# Patient Record
Sex: Male | Born: 1950 | Race: Black or African American | Hispanic: No | Marital: Married | Smoking: Former smoker
Health system: Southern US, Community
[De-identification: ages and names within clinical notes are randomized; demographics above are authoritative.]

## PROBLEM LIST (undated history)

## (undated) DIAGNOSIS — D638 Anemia in other chronic diseases classified elsewhere: Secondary | ICD-10-CM

## (undated) DIAGNOSIS — N186 End stage renal disease: Secondary | ICD-10-CM

## (undated) DIAGNOSIS — K219 Gastro-esophageal reflux disease without esophagitis: Secondary | ICD-10-CM

## (undated) DIAGNOSIS — E119 Type 2 diabetes mellitus without complications: Secondary | ICD-10-CM

## (undated) DIAGNOSIS — D869 Sarcoidosis, unspecified: Secondary | ICD-10-CM

## (undated) DIAGNOSIS — Z87442 Personal history of urinary calculi: Secondary | ICD-10-CM

## (undated) DIAGNOSIS — E7800 Pure hypercholesterolemia, unspecified: Secondary | ICD-10-CM

## (undated) DIAGNOSIS — I10 Essential (primary) hypertension: Secondary | ICD-10-CM

## (undated) DIAGNOSIS — I739 Peripheral vascular disease, unspecified: Secondary | ICD-10-CM

## (undated) HISTORY — DX: Peripheral vascular disease, unspecified: I73.9

## (undated) HISTORY — PX: BELOW KNEE LEG AMPUTATION: SUR23

## (undated) HISTORY — PX: COLONOSCOPY: SHX174

## (undated) HISTORY — DX: Type 2 diabetes mellitus without complications: E11.9

## (undated) HISTORY — DX: End stage renal disease: N18.6

## (undated) HISTORY — PX: ESOPHAGEAL DILATION: SHX303

## (undated) HISTORY — DX: Sarcoidosis, unspecified: D86.9

## (undated) HISTORY — DX: Essential (primary) hypertension: I10

## (undated) HISTORY — DX: Anemia in other chronic diseases classified elsewhere: D63.8

## (undated) HISTORY — PX: EYE SURGERY: SHX253

---

## 2002-04-02 ENCOUNTER — Encounter: Payer: Self-pay | Admitting: Internal Medicine

## 2002-04-02 ENCOUNTER — Ambulatory Visit (HOSPITAL_COMMUNITY): Admission: RE | Admit: 2002-04-02 | Discharge: 2002-04-02 | Payer: Self-pay | Admitting: Internal Medicine

## 2002-06-17 ENCOUNTER — Encounter: Admission: RE | Admit: 2002-06-17 | Discharge: 2002-06-17 | Payer: Self-pay | Admitting: Oncology

## 2002-06-17 ENCOUNTER — Encounter (HOSPITAL_COMMUNITY): Admission: RE | Admit: 2002-06-17 | Discharge: 2002-07-17 | Payer: Self-pay | Admitting: Oncology

## 2002-06-19 ENCOUNTER — Encounter (HOSPITAL_COMMUNITY): Payer: Self-pay | Admitting: Oncology

## 2002-06-27 ENCOUNTER — Encounter (HOSPITAL_COMMUNITY): Payer: Self-pay | Admitting: Oncology

## 2002-07-02 ENCOUNTER — Encounter: Admission: RE | Admit: 2002-07-02 | Discharge: 2002-07-02 | Payer: Self-pay | Admitting: Oncology

## 2002-07-02 ENCOUNTER — Encounter (HOSPITAL_COMMUNITY): Admission: RE | Admit: 2002-07-02 | Discharge: 2002-08-01 | Payer: Self-pay | Admitting: Oncology

## 2002-07-21 ENCOUNTER — Encounter (HOSPITAL_COMMUNITY): Payer: Self-pay | Admitting: Oncology

## 2002-09-05 ENCOUNTER — Ambulatory Visit (HOSPITAL_COMMUNITY): Admission: RE | Admit: 2002-09-05 | Discharge: 2002-09-05 | Payer: Self-pay | Admitting: Internal Medicine

## 2002-09-09 ENCOUNTER — Encounter: Admission: RE | Admit: 2002-09-09 | Discharge: 2002-09-09 | Payer: Self-pay | Admitting: Oncology

## 2003-03-11 ENCOUNTER — Encounter: Admission: RE | Admit: 2003-03-11 | Discharge: 2003-03-11 | Payer: Self-pay | Admitting: Oncology

## 2003-04-23 ENCOUNTER — Encounter (HOSPITAL_COMMUNITY): Admission: RE | Admit: 2003-04-23 | Discharge: 2003-05-23 | Payer: Self-pay | Admitting: Oncology

## 2003-05-29 ENCOUNTER — Encounter (HOSPITAL_COMMUNITY): Admission: RE | Admit: 2003-05-29 | Discharge: 2003-06-28 | Payer: Self-pay | Admitting: Nephrology

## 2003-07-03 ENCOUNTER — Encounter (HOSPITAL_COMMUNITY): Admission: RE | Admit: 2003-07-03 | Discharge: 2003-08-02 | Payer: Self-pay | Admitting: Nephrology

## 2003-08-07 ENCOUNTER — Encounter (HOSPITAL_COMMUNITY): Admission: RE | Admit: 2003-08-07 | Discharge: 2003-09-06 | Payer: Self-pay | Admitting: Nephrology

## 2003-09-11 ENCOUNTER — Encounter (HOSPITAL_COMMUNITY): Admission: RE | Admit: 2003-09-11 | Discharge: 2003-10-11 | Payer: Self-pay | Admitting: Nephrology

## 2003-10-16 ENCOUNTER — Encounter (HOSPITAL_COMMUNITY): Admission: RE | Admit: 2003-10-16 | Discharge: 2003-11-15 | Payer: Self-pay | Admitting: Nephrology

## 2003-11-20 ENCOUNTER — Encounter (HOSPITAL_COMMUNITY): Admission: RE | Admit: 2003-11-20 | Discharge: 2003-12-20 | Payer: Self-pay | Admitting: Nephrology

## 2004-01-01 ENCOUNTER — Encounter (HOSPITAL_COMMUNITY): Admission: RE | Admit: 2004-01-01 | Discharge: 2004-01-15 | Payer: Self-pay | Admitting: Oncology

## 2004-01-29 ENCOUNTER — Encounter (HOSPITAL_COMMUNITY): Admission: RE | Admit: 2004-01-29 | Discharge: 2004-02-28 | Payer: Self-pay | Admitting: Nephrology

## 2004-02-26 ENCOUNTER — Ambulatory Visit (HOSPITAL_COMMUNITY): Payer: Self-pay

## 2004-03-09 ENCOUNTER — Encounter (HOSPITAL_COMMUNITY): Admission: RE | Admit: 2004-03-09 | Discharge: 2004-04-08 | Payer: Self-pay | Admitting: Nephrology

## 2004-03-09 ENCOUNTER — Ambulatory Visit (HOSPITAL_COMMUNITY): Payer: Self-pay | Admitting: Nephrology

## 2004-04-15 ENCOUNTER — Encounter (HOSPITAL_COMMUNITY): Admission: RE | Admit: 2004-04-15 | Discharge: 2004-05-15 | Payer: Self-pay | Admitting: Oncology

## 2004-04-15 ENCOUNTER — Ambulatory Visit (HOSPITAL_COMMUNITY): Payer: Self-pay | Admitting: Internal Medicine

## 2004-05-27 ENCOUNTER — Ambulatory Visit (HOSPITAL_COMMUNITY): Payer: Self-pay | Admitting: Nephrology

## 2004-05-27 ENCOUNTER — Encounter (HOSPITAL_COMMUNITY): Admission: RE | Admit: 2004-05-27 | Discharge: 2004-06-26 | Payer: Self-pay | Admitting: Nephrology

## 2004-07-01 ENCOUNTER — Encounter (HOSPITAL_COMMUNITY): Admission: RE | Admit: 2004-07-01 | Discharge: 2004-07-31 | Payer: Self-pay | Admitting: Nephrology

## 2004-07-28 ENCOUNTER — Ambulatory Visit (HOSPITAL_COMMUNITY): Payer: Self-pay | Admitting: Internal Medicine

## 2004-08-12 ENCOUNTER — Encounter (HOSPITAL_COMMUNITY): Admission: RE | Admit: 2004-08-12 | Discharge: 2004-09-11 | Payer: Self-pay | Admitting: Nephrology

## 2004-08-12 ENCOUNTER — Ambulatory Visit (HOSPITAL_COMMUNITY): Payer: Self-pay | Admitting: Nephrology

## 2004-09-23 ENCOUNTER — Encounter (HOSPITAL_COMMUNITY): Admission: RE | Admit: 2004-09-23 | Discharge: 2004-10-23 | Payer: Self-pay | Admitting: Nephrology

## 2004-10-14 ENCOUNTER — Ambulatory Visit (HOSPITAL_COMMUNITY): Payer: Self-pay | Admitting: Nephrology

## 2004-10-28 ENCOUNTER — Encounter (HOSPITAL_COMMUNITY): Admission: RE | Admit: 2004-10-28 | Discharge: 2004-11-27 | Payer: Self-pay | Admitting: Nephrology

## 2004-12-07 ENCOUNTER — Encounter (HOSPITAL_COMMUNITY): Admission: RE | Admit: 2004-12-07 | Discharge: 2005-01-06 | Payer: Self-pay | Admitting: Nephrology

## 2004-12-07 ENCOUNTER — Ambulatory Visit (HOSPITAL_COMMUNITY): Payer: Self-pay | Admitting: Nephrology

## 2005-01-20 ENCOUNTER — Encounter (HOSPITAL_COMMUNITY): Admission: RE | Admit: 2005-01-20 | Discharge: 2005-02-19 | Payer: Self-pay | Admitting: Oncology

## 2005-02-17 ENCOUNTER — Ambulatory Visit (HOSPITAL_COMMUNITY): Payer: Self-pay | Admitting: Nephrology

## 2005-03-03 ENCOUNTER — Encounter (HOSPITAL_COMMUNITY): Admission: RE | Admit: 2005-03-03 | Discharge: 2005-04-02 | Payer: Self-pay | Admitting: Nephrology

## 2005-04-14 ENCOUNTER — Encounter (HOSPITAL_COMMUNITY): Admission: RE | Admit: 2005-04-14 | Discharge: 2005-05-14 | Payer: Self-pay | Admitting: Nephrology

## 2005-04-14 ENCOUNTER — Ambulatory Visit (HOSPITAL_COMMUNITY): Payer: Self-pay | Admitting: Nephrology

## 2005-04-28 ENCOUNTER — Encounter (HOSPITAL_COMMUNITY): Admission: RE | Admit: 2005-04-28 | Discharge: 2005-05-28 | Payer: Self-pay | Admitting: Oncology

## 2005-05-26 ENCOUNTER — Ambulatory Visit (HOSPITAL_COMMUNITY): Admission: RE | Admit: 2005-05-26 | Discharge: 2005-05-26 | Payer: Self-pay | Admitting: Podiatry

## 2005-06-09 ENCOUNTER — Ambulatory Visit (HOSPITAL_COMMUNITY): Payer: Self-pay | Admitting: Nephrology

## 2005-06-09 ENCOUNTER — Encounter (HOSPITAL_COMMUNITY): Admission: RE | Admit: 2005-06-09 | Discharge: 2005-07-09 | Payer: Self-pay | Admitting: Nephrology

## 2005-07-20 ENCOUNTER — Encounter (HOSPITAL_COMMUNITY): Admission: RE | Admit: 2005-07-20 | Discharge: 2005-08-19 | Payer: Self-pay | Admitting: Nephrology

## 2005-08-18 ENCOUNTER — Ambulatory Visit (HOSPITAL_COMMUNITY): Payer: Self-pay | Admitting: Nephrology

## 2005-09-01 ENCOUNTER — Encounter (HOSPITAL_COMMUNITY): Admission: RE | Admit: 2005-09-01 | Discharge: 2005-10-01 | Payer: Self-pay | Admitting: Nephrology

## 2005-10-13 ENCOUNTER — Ambulatory Visit (HOSPITAL_COMMUNITY): Payer: Self-pay | Admitting: Nephrology

## 2005-10-13 ENCOUNTER — Encounter (HOSPITAL_COMMUNITY): Admission: RE | Admit: 2005-10-13 | Discharge: 2005-11-12 | Payer: Self-pay | Admitting: Nephrology

## 2005-10-30 ENCOUNTER — Ambulatory Visit (HOSPITAL_COMMUNITY): Admission: RE | Admit: 2005-10-30 | Discharge: 2005-10-30 | Payer: Self-pay | Admitting: Vascular Surgery

## 2005-11-24 ENCOUNTER — Encounter (HOSPITAL_COMMUNITY): Admission: RE | Admit: 2005-11-24 | Discharge: 2005-12-24 | Payer: Self-pay | Admitting: Nephrology

## 2005-11-24 ENCOUNTER — Ambulatory Visit (HOSPITAL_COMMUNITY): Payer: Self-pay | Admitting: Nephrology

## 2005-12-22 ENCOUNTER — Ambulatory Visit (HOSPITAL_COMMUNITY): Payer: Self-pay | Admitting: Nephrology

## 2006-01-05 ENCOUNTER — Encounter (HOSPITAL_COMMUNITY): Admission: RE | Admit: 2006-01-05 | Discharge: 2006-01-12 | Payer: Self-pay | Admitting: Nephrology

## 2006-01-10 ENCOUNTER — Observation Stay (HOSPITAL_COMMUNITY): Admission: RE | Admit: 2006-01-10 | Discharge: 2006-01-11 | Payer: Self-pay | Admitting: General Surgery

## 2006-01-10 ENCOUNTER — Encounter (INDEPENDENT_AMBULATORY_CARE_PROVIDER_SITE_OTHER): Payer: Self-pay | Admitting: Specialist

## 2006-01-18 ENCOUNTER — Ambulatory Visit (HOSPITAL_COMMUNITY): Payer: Self-pay | Admitting: Oncology

## 2006-01-18 ENCOUNTER — Encounter: Admission: RE | Admit: 2006-01-18 | Discharge: 2006-01-18 | Payer: Self-pay | Admitting: Oncology

## 2006-01-18 ENCOUNTER — Encounter (HOSPITAL_COMMUNITY): Admission: RE | Admit: 2006-01-18 | Discharge: 2006-02-17 | Payer: Self-pay | Admitting: Oncology

## 2006-03-02 ENCOUNTER — Encounter (HOSPITAL_COMMUNITY): Admission: RE | Admit: 2006-03-02 | Discharge: 2006-04-01 | Payer: Self-pay | Admitting: Nephrology

## 2006-03-02 ENCOUNTER — Ambulatory Visit (HOSPITAL_COMMUNITY): Payer: Self-pay | Admitting: Nephrology

## 2006-04-13 ENCOUNTER — Encounter (HOSPITAL_COMMUNITY): Admission: RE | Admit: 2006-04-13 | Discharge: 2006-04-16 | Payer: Self-pay | Admitting: Oncology

## 2006-04-27 ENCOUNTER — Encounter (HOSPITAL_COMMUNITY): Admission: RE | Admit: 2006-04-27 | Discharge: 2006-05-27 | Payer: Self-pay | Admitting: Nephrology

## 2006-05-25 ENCOUNTER — Ambulatory Visit (HOSPITAL_COMMUNITY): Payer: Self-pay | Admitting: Nephrology

## 2006-06-22 ENCOUNTER — Encounter (HOSPITAL_COMMUNITY): Admission: RE | Admit: 2006-06-22 | Discharge: 2006-07-22 | Payer: Self-pay | Admitting: Nephrology

## 2006-08-17 ENCOUNTER — Encounter (HOSPITAL_COMMUNITY): Admission: RE | Admit: 2006-08-17 | Discharge: 2006-09-16 | Payer: Self-pay | Admitting: Nephrology

## 2006-08-17 ENCOUNTER — Ambulatory Visit (HOSPITAL_COMMUNITY): Payer: Self-pay | Admitting: Nephrology

## 2006-09-24 ENCOUNTER — Ambulatory Visit (HOSPITAL_COMMUNITY): Admission: RE | Admit: 2006-09-24 | Discharge: 2006-09-24 | Payer: Self-pay | Admitting: Internal Medicine

## 2006-09-24 ENCOUNTER — Ambulatory Visit: Payer: Self-pay | Admitting: Internal Medicine

## 2006-10-12 ENCOUNTER — Encounter (HOSPITAL_COMMUNITY): Admission: RE | Admit: 2006-10-12 | Discharge: 2006-11-11 | Payer: Self-pay | Admitting: Nephrology

## 2006-11-09 ENCOUNTER — Ambulatory Visit (HOSPITAL_COMMUNITY): Payer: Self-pay | Admitting: Nephrology

## 2006-12-14 ENCOUNTER — Encounter (HOSPITAL_COMMUNITY): Admission: RE | Admit: 2006-12-14 | Discharge: 2007-01-13 | Payer: Self-pay | Admitting: Nephrology

## 2007-01-11 ENCOUNTER — Ambulatory Visit (HOSPITAL_COMMUNITY): Payer: Self-pay | Admitting: Nephrology

## 2007-02-15 ENCOUNTER — Encounter (HOSPITAL_COMMUNITY): Admission: RE | Admit: 2007-02-15 | Discharge: 2007-03-17 | Payer: Self-pay | Admitting: Oncology

## 2007-03-13 ENCOUNTER — Ambulatory Visit (HOSPITAL_COMMUNITY): Payer: Self-pay | Admitting: Nephrology

## 2007-04-22 ENCOUNTER — Encounter (HOSPITAL_COMMUNITY): Admission: RE | Admit: 2007-04-22 | Discharge: 2007-05-22 | Payer: Self-pay | Admitting: Nephrology

## 2007-05-24 ENCOUNTER — Ambulatory Visit (HOSPITAL_COMMUNITY): Payer: Self-pay | Admitting: Nephrology

## 2007-05-24 ENCOUNTER — Encounter (HOSPITAL_COMMUNITY): Admission: RE | Admit: 2007-05-24 | Discharge: 2007-06-23 | Payer: Self-pay | Admitting: Oncology

## 2007-06-23 ENCOUNTER — Inpatient Hospital Stay (HOSPITAL_COMMUNITY): Admission: EM | Admit: 2007-06-23 | Discharge: 2007-06-29 | Payer: Self-pay | Admitting: Emergency Medicine

## 2007-06-26 ENCOUNTER — Encounter (INDEPENDENT_AMBULATORY_CARE_PROVIDER_SITE_OTHER): Payer: Self-pay | Admitting: General Surgery

## 2007-07-19 ENCOUNTER — Encounter (HOSPITAL_COMMUNITY): Admission: RE | Admit: 2007-07-19 | Discharge: 2007-08-18 | Payer: Self-pay | Admitting: Nephrology

## 2007-08-16 ENCOUNTER — Ambulatory Visit (HOSPITAL_COMMUNITY): Payer: Self-pay | Admitting: Nephrology

## 2007-09-20 ENCOUNTER — Encounter (HOSPITAL_COMMUNITY): Admission: RE | Admit: 2007-09-20 | Discharge: 2007-10-20 | Payer: Self-pay | Admitting: Nephrology

## 2007-10-18 ENCOUNTER — Ambulatory Visit (HOSPITAL_COMMUNITY): Payer: Self-pay | Admitting: Nephrology

## 2007-11-22 ENCOUNTER — Encounter (HOSPITAL_COMMUNITY): Admission: RE | Admit: 2007-11-22 | Discharge: 2007-12-22 | Payer: Self-pay | Admitting: Nephrology

## 2007-12-27 ENCOUNTER — Ambulatory Visit (HOSPITAL_COMMUNITY): Payer: Self-pay | Admitting: Nephrology

## 2007-12-27 ENCOUNTER — Encounter (HOSPITAL_COMMUNITY): Admission: RE | Admit: 2007-12-27 | Discharge: 2008-01-26 | Payer: Self-pay | Admitting: Oncology

## 2008-02-18 ENCOUNTER — Encounter (HOSPITAL_COMMUNITY): Admission: RE | Admit: 2008-02-18 | Discharge: 2008-03-19 | Payer: Self-pay | Admitting: Nephrology

## 2008-02-18 ENCOUNTER — Ambulatory Visit (HOSPITAL_COMMUNITY): Payer: Self-pay | Admitting: Nephrology

## 2008-03-20 ENCOUNTER — Encounter (HOSPITAL_COMMUNITY): Admission: RE | Admit: 2008-03-20 | Discharge: 2008-04-19 | Payer: Self-pay | Admitting: Nephrology

## 2008-04-17 HISTORY — PX: AMPUTATION OF REPLICATED TOES: SHX1136

## 2008-05-01 ENCOUNTER — Encounter (HOSPITAL_COMMUNITY): Admission: RE | Admit: 2008-05-01 | Discharge: 2008-05-31 | Payer: Self-pay | Admitting: Nephrology

## 2008-05-01 ENCOUNTER — Ambulatory Visit (HOSPITAL_COMMUNITY): Payer: Self-pay | Admitting: Nephrology

## 2008-06-05 ENCOUNTER — Encounter (HOSPITAL_COMMUNITY): Admission: RE | Admit: 2008-06-05 | Discharge: 2008-07-05 | Payer: Self-pay | Admitting: Nephrology

## 2008-07-03 ENCOUNTER — Ambulatory Visit (HOSPITAL_COMMUNITY): Payer: Self-pay | Admitting: Nephrology

## 2008-07-31 ENCOUNTER — Encounter (HOSPITAL_COMMUNITY): Admission: RE | Admit: 2008-07-31 | Discharge: 2008-08-30 | Payer: Self-pay | Admitting: Nephrology

## 2008-08-28 ENCOUNTER — Ambulatory Visit (HOSPITAL_COMMUNITY): Payer: Self-pay | Admitting: Nephrology

## 2008-09-25 ENCOUNTER — Encounter (HOSPITAL_COMMUNITY): Admission: RE | Admit: 2008-09-25 | Discharge: 2008-10-25 | Payer: Self-pay | Admitting: Nephrology

## 2008-10-30 ENCOUNTER — Encounter (HOSPITAL_COMMUNITY): Admission: RE | Admit: 2008-10-30 | Discharge: 2008-11-29 | Payer: Self-pay | Admitting: Nephrology

## 2008-10-30 ENCOUNTER — Ambulatory Visit (HOSPITAL_COMMUNITY): Payer: Self-pay | Admitting: Nephrology

## 2009-01-01 ENCOUNTER — Encounter (HOSPITAL_COMMUNITY): Admission: RE | Admit: 2009-01-01 | Discharge: 2009-01-13 | Payer: Self-pay | Admitting: Nephrology

## 2009-01-01 ENCOUNTER — Ambulatory Visit (HOSPITAL_COMMUNITY): Payer: Self-pay | Admitting: Nephrology

## 2009-01-29 ENCOUNTER — Encounter (HOSPITAL_COMMUNITY): Admission: RE | Admit: 2009-01-29 | Discharge: 2009-02-28 | Payer: Self-pay | Admitting: Nephrology

## 2009-02-26 ENCOUNTER — Ambulatory Visit (HOSPITAL_COMMUNITY): Payer: Self-pay | Admitting: Nephrology

## 2009-03-26 ENCOUNTER — Encounter (HOSPITAL_COMMUNITY): Admission: RE | Admit: 2009-03-26 | Discharge: 2009-04-14 | Payer: Self-pay | Admitting: Nephrology

## 2009-05-07 ENCOUNTER — Ambulatory Visit (HOSPITAL_COMMUNITY): Payer: Self-pay | Admitting: Nephrology

## 2009-05-07 ENCOUNTER — Encounter (HOSPITAL_COMMUNITY): Admission: RE | Admit: 2009-05-07 | Discharge: 2009-06-06 | Payer: Self-pay | Admitting: Nephrology

## 2009-06-18 ENCOUNTER — Encounter (HOSPITAL_COMMUNITY): Admission: RE | Admit: 2009-06-18 | Discharge: 2009-07-18 | Payer: Self-pay | Admitting: Nephrology

## 2009-08-05 ENCOUNTER — Encounter (HOSPITAL_COMMUNITY): Admission: RE | Admit: 2009-08-05 | Discharge: 2009-09-04 | Payer: Self-pay | Admitting: Nephrology

## 2009-08-05 ENCOUNTER — Ambulatory Visit (HOSPITAL_COMMUNITY): Payer: Self-pay | Admitting: Nephrology

## 2009-10-08 ENCOUNTER — Ambulatory Visit (HOSPITAL_COMMUNITY): Payer: Self-pay | Admitting: Nephrology

## 2009-10-08 ENCOUNTER — Encounter (HOSPITAL_COMMUNITY): Admission: RE | Admit: 2009-10-08 | Discharge: 2009-11-07 | Payer: Self-pay | Admitting: Nephrology

## 2009-12-10 ENCOUNTER — Ambulatory Visit (HOSPITAL_COMMUNITY): Payer: Self-pay | Admitting: Nephrology

## 2009-12-10 ENCOUNTER — Encounter (HOSPITAL_COMMUNITY): Admission: RE | Admit: 2009-12-10 | Discharge: 2010-01-09 | Payer: Self-pay | Admitting: Nephrology

## 2010-01-14 ENCOUNTER — Encounter (HOSPITAL_COMMUNITY): Admission: RE | Admit: 2010-01-14 | Discharge: 2010-01-14 | Payer: Self-pay | Admitting: Nephrology

## 2010-02-18 ENCOUNTER — Encounter (HOSPITAL_COMMUNITY)
Admission: RE | Admit: 2010-02-18 | Discharge: 2010-03-20 | Payer: Self-pay | Source: Home / Self Care | Admitting: Nephrology

## 2010-02-18 ENCOUNTER — Ambulatory Visit (HOSPITAL_COMMUNITY): Payer: Self-pay | Admitting: Nephrology

## 2010-03-25 ENCOUNTER — Encounter (HOSPITAL_COMMUNITY)
Admission: RE | Admit: 2010-03-25 | Discharge: 2010-04-24 | Payer: Self-pay | Source: Home / Self Care | Attending: Nephrology | Admitting: Nephrology

## 2010-04-29 ENCOUNTER — Encounter (HOSPITAL_COMMUNITY)
Admission: RE | Admit: 2010-04-29 | Discharge: 2010-05-17 | Payer: Self-pay | Source: Home / Self Care | Attending: Nephrology | Admitting: Nephrology

## 2010-04-29 ENCOUNTER — Ambulatory Visit (HOSPITAL_COMMUNITY)
Admission: RE | Admit: 2010-04-29 | Discharge: 2010-05-17 | Payer: Self-pay | Source: Home / Self Care | Attending: Nephrology | Admitting: Nephrology

## 2010-05-02 LAB — RENAL FUNCTION PANEL
Albumin: 3.5 g/dL (ref 3.5–5.2)
BUN: 28 mg/dL — ABNORMAL HIGH (ref 6–23)
CO2: 23 mEq/L (ref 19–32)
Calcium: 8.8 mg/dL (ref 8.4–10.5)
Chloride: 110 mEq/L (ref 96–112)
Creatinine, Ser: 2.81 mg/dL — ABNORMAL HIGH (ref 0.4–1.5)
GFR calc Af Amer: 28 mL/min — ABNORMAL LOW (ref >60)
GFR calc non Af Amer: 23 mL/min — ABNORMAL LOW (ref >60)
Glucose, Bld: 95 mg/dL (ref 70–99)
Phosphorus: 4.4 mg/dL (ref 2.3–4.6)
Potassium: 4.5 mEq/L (ref 3.5–5.1)
Sodium: 142 mEq/L (ref 135–145)

## 2010-05-02 LAB — HEMOGLOBIN AND HEMATOCRIT, BLOOD
HCT: 31.4 % — ABNORMAL LOW (ref 39.0–52.0)
Hemoglobin: 10.8 g/dL — ABNORMAL LOW (ref 13.0–17.0)

## 2010-06-03 ENCOUNTER — Other Ambulatory Visit: Payer: Self-pay | Admitting: Nephrology

## 2010-06-03 ENCOUNTER — Encounter (HOSPITAL_COMMUNITY): Payer: Medicare Other | Attending: Oncology

## 2010-06-03 ENCOUNTER — Ambulatory Visit (HOSPITAL_COMMUNITY): Payer: Medicare Other

## 2010-06-03 DIAGNOSIS — I129 Hypertensive chronic kidney disease with stage 1 through stage 4 chronic kidney disease, or unspecified chronic kidney disease: Secondary | ICD-10-CM | POA: Insufficient documentation

## 2010-06-03 DIAGNOSIS — D631 Anemia in chronic kidney disease: Secondary | ICD-10-CM | POA: Insufficient documentation

## 2010-06-03 DIAGNOSIS — N189 Chronic kidney disease, unspecified: Secondary | ICD-10-CM | POA: Insufficient documentation

## 2010-06-03 LAB — RENAL FUNCTION PANEL
Albumin: 3.5 g/dL (ref 3.5–5.2)
BUN: 39 mg/dL — ABNORMAL HIGH (ref 6–23)
CO2: 23 mEq/L (ref 19–32)
Calcium: 8.8 mg/dL (ref 8.4–10.5)
Chloride: 111 mEq/L (ref 96–112)
Creatinine, Ser: 3.04 mg/dL — ABNORMAL HIGH (ref 0.4–1.5)
GFR calc Af Amer: 26 mL/min — ABNORMAL LOW (ref >60)
GFR calc non Af Amer: 21 mL/min — ABNORMAL LOW (ref >60)
Glucose, Bld: 140 mg/dL — ABNORMAL HIGH (ref 70–99)
Phosphorus: 3.9 mg/dL (ref 2.3–4.6)
Potassium: 4.9 mEq/L (ref 3.5–5.1)
Sodium: 141 mEq/L (ref 135–145)

## 2010-06-03 LAB — HEMOGLOBIN AND HEMATOCRIT, BLOOD
HCT: 34.1 % — ABNORMAL LOW (ref 39.0–52.0)
Hemoglobin: 11.4 g/dL — ABNORMAL LOW (ref 13.0–17.0)

## 2010-06-27 LAB — RENAL FUNCTION PANEL
Albumin: 3.5 g/dL (ref 3.5–5.2)
BUN: 33 mg/dL — ABNORMAL HIGH (ref 6–23)
CO2: 24 mEq/L (ref 19–32)
Calcium: 9 mg/dL (ref 8.4–10.5)
Chloride: 111 mEq/L (ref 96–112)
Creatinine, Ser: 2.71 mg/dL — ABNORMAL HIGH (ref 0.4–1.5)
GFR calc Af Amer: 29 mL/min — ABNORMAL LOW (ref >60)
GFR calc non Af Amer: 24 mL/min — ABNORMAL LOW (ref >60)
Glucose, Bld: 118 mg/dL — ABNORMAL HIGH (ref 70–99)
Phosphorus: 4 mg/dL (ref 2.3–4.6)
Potassium: 4.5 mEq/L (ref 3.5–5.1)
Sodium: 142 mEq/L (ref 135–145)

## 2010-06-27 LAB — HEMOGLOBIN AND HEMATOCRIT, BLOOD
HCT: 30.2 % — ABNORMAL LOW (ref 39.0–52.0)
Hemoglobin: 10.2 g/dL — ABNORMAL LOW (ref 13.0–17.0)

## 2010-06-28 LAB — RENAL FUNCTION PANEL
Albumin: 3.4 g/dL — ABNORMAL LOW (ref 3.5–5.2)
BUN: 34 mg/dL — ABNORMAL HIGH (ref 6–23)
CO2: 20 mEq/L (ref 19–32)
Calcium: 8.9 mg/dL (ref 8.4–10.5)
Chloride: 110 mEq/L (ref 96–112)
Creatinine, Ser: 3.08 mg/dL — ABNORMAL HIGH (ref 0.4–1.5)
GFR calc Af Amer: 25 mL/min — ABNORMAL LOW (ref >60)
GFR calc non Af Amer: 21 mL/min — ABNORMAL LOW (ref >60)
Glucose, Bld: 102 mg/dL — ABNORMAL HIGH (ref 70–99)
Phosphorus: 3.4 mg/dL (ref 2.3–4.6)
Potassium: 4.9 mEq/L (ref 3.5–5.1)
Sodium: 137 mEq/L (ref 135–145)

## 2010-06-28 LAB — HEMOGLOBIN AND HEMATOCRIT, BLOOD
HCT: 31.2 % — ABNORMAL LOW (ref 39.0–52.0)
Hemoglobin: 10.6 g/dL — ABNORMAL LOW (ref 13.0–17.0)

## 2010-06-30 LAB — RENAL FUNCTION PANEL
Albumin: 3.4 g/dL — ABNORMAL LOW (ref 3.5–5.2)
Albumin: 3.3 g/dL — ABNORMAL LOW (ref 3.5–5.2)
BUN: 30 mg/dL — ABNORMAL HIGH (ref 6–23)
BUN: 38 mg/dL — ABNORMAL HIGH (ref 6–23)
CO2: 25 mEq/L (ref 19–32)
CO2: 21 mEq/L (ref 19–32)
Calcium: 8.8 mg/dL (ref 8.4–10.5)
Calcium: 8.9 mg/dL (ref 8.4–10.5)
Chloride: 109 mEq/L (ref 96–112)
Chloride: 110 mEq/L (ref 96–112)
Creatinine, Ser: 2.82 mg/dL — ABNORMAL HIGH (ref 0.4–1.5)
Creatinine, Ser: 3.23 mg/dL — ABNORMAL HIGH (ref 0.4–1.5)
GFR calc Af Amer: 28 mL/min — ABNORMAL LOW (ref >60)
GFR calc Af Amer: 24 mL/min — ABNORMAL LOW (ref >60)
GFR calc non Af Amer: 23 mL/min — ABNORMAL LOW (ref >60)
GFR calc non Af Amer: 20 mL/min — ABNORMAL LOW (ref >60)
Glucose, Bld: 128 mg/dL — ABNORMAL HIGH (ref 70–99)
Glucose, Bld: 112 mg/dL — ABNORMAL HIGH (ref 70–99)
Phosphorus: 3.4 mg/dL (ref 2.3–4.6)
Phosphorus: 3 mg/dL (ref 2.3–4.6)
Potassium: 4.1 mEq/L (ref 3.5–5.1)
Potassium: 4.6 mEq/L (ref 3.5–5.1)
Sodium: 142 mEq/L (ref 135–145)
Sodium: 137 mEq/L (ref 135–145)

## 2010-06-30 LAB — HEMOGLOBIN AND HEMATOCRIT, BLOOD
HCT: 30.8 % — ABNORMAL LOW (ref 39.0–52.0)
HCT: 31.1 % — ABNORMAL LOW (ref 39.0–52.0)
Hemoglobin: 10.5 g/dL — ABNORMAL LOW (ref 13.0–17.0)
Hemoglobin: 10.6 g/dL — ABNORMAL LOW (ref 13.0–17.0)

## 2010-07-01 ENCOUNTER — Encounter (HOSPITAL_COMMUNITY): Payer: Medicare Other | Attending: Nephrology

## 2010-07-01 ENCOUNTER — Ambulatory Visit (HOSPITAL_COMMUNITY): Payer: Medicare Other

## 2010-07-01 DIAGNOSIS — D631 Anemia in chronic kidney disease: Secondary | ICD-10-CM | POA: Insufficient documentation

## 2010-07-01 DIAGNOSIS — I129 Hypertensive chronic kidney disease with stage 1 through stage 4 chronic kidney disease, or unspecified chronic kidney disease: Secondary | ICD-10-CM | POA: Insufficient documentation

## 2010-07-01 DIAGNOSIS — N189 Chronic kidney disease, unspecified: Secondary | ICD-10-CM | POA: Insufficient documentation

## 2010-07-01 DIAGNOSIS — N039 Chronic nephritic syndrome with unspecified morphologic changes: Secondary | ICD-10-CM | POA: Insufficient documentation

## 2010-07-02 LAB — RENAL FUNCTION PANEL
Albumin: 3.2 g/dL — ABNORMAL LOW (ref 3.5–5.2)
BUN: 35 mg/dL — ABNORMAL HIGH (ref 6–23)
CO2: 21 mEq/L (ref 19–32)
Calcium: 8.9 mg/dL (ref 8.4–10.5)
Chloride: 110 mEq/L (ref 96–112)
Creatinine, Ser: 4.06 mg/dL — ABNORMAL HIGH (ref 0.4–1.5)
GFR calc Af Amer: 18 mL/min — ABNORMAL LOW (ref >60)
GFR calc non Af Amer: 15 mL/min — ABNORMAL LOW (ref >60)
Glucose, Bld: 83 mg/dL (ref 70–99)
Phosphorus: 3.8 mg/dL (ref 2.3–4.6)
Potassium: 5.4 mEq/L — ABNORMAL HIGH (ref 3.5–5.1)
Sodium: 137 mEq/L (ref 135–145)

## 2010-07-02 LAB — HEMOGLOBIN AND HEMATOCRIT, BLOOD
HCT: 30.4 % — ABNORMAL LOW (ref 39.0–52.0)
Hemoglobin: 10.3 g/dL — ABNORMAL LOW (ref 13.0–17.0)

## 2010-07-03 LAB — RENAL FUNCTION PANEL
Albumin: 3.5 g/dL (ref 3.5–5.2)
Albumin: 3.4 g/dL — ABNORMAL LOW (ref 3.5–5.2)
BUN: 37 mg/dL — ABNORMAL HIGH (ref 6–23)
BUN: 27 mg/dL — ABNORMAL HIGH (ref 6–23)
CO2: 21 mEq/L (ref 19–32)
CO2: 21 mEq/L (ref 19–32)
Calcium: 8.8 mg/dL (ref 8.4–10.5)
Calcium: 8.6 mg/dL (ref 8.4–10.5)
Chloride: 108 mEq/L (ref 96–112)
Chloride: 107 mEq/L (ref 96–112)
Creatinine, Ser: 2.9 mg/dL — ABNORMAL HIGH (ref 0.4–1.5)
Creatinine, Ser: 2.61 mg/dL — ABNORMAL HIGH (ref 0.4–1.5)
GFR calc Af Amer: 27 mL/min — ABNORMAL LOW (ref >60)
GFR calc Af Amer: 31 mL/min — ABNORMAL LOW (ref >60)
GFR calc non Af Amer: 23 mL/min — ABNORMAL LOW (ref >60)
GFR calc non Af Amer: 25 mL/min — ABNORMAL LOW (ref >60)
Glucose, Bld: 97 mg/dL (ref 70–99)
Glucose, Bld: 116 mg/dL — ABNORMAL HIGH (ref 70–99)
Phosphorus: 3.6 mg/dL (ref 2.3–4.6)
Phosphorus: 3.6 mg/dL (ref 2.3–4.6)
Potassium: 4.5 mEq/L (ref 3.5–5.1)
Potassium: 4.5 mEq/L (ref 3.5–5.1)
Sodium: 140 mEq/L (ref 135–145)
Sodium: 135 mEq/L (ref 135–145)

## 2010-07-03 LAB — HEMOGLOBIN AND HEMATOCRIT, BLOOD
HCT: 35.2 % — ABNORMAL LOW (ref 39.0–52.0)
HCT: 32.7 % — ABNORMAL LOW (ref 39.0–52.0)
Hemoglobin: 12 g/dL — ABNORMAL LOW (ref 13.0–17.0)
Hemoglobin: 11.2 g/dL — ABNORMAL LOW (ref 13.0–17.0)

## 2010-07-04 LAB — RENAL FUNCTION PANEL
Albumin: 3.7 g/dL (ref 3.5–5.2)
BUN: 42 mg/dL — ABNORMAL HIGH (ref 6–23)
Calcium: 8.8 mg/dL (ref 8.4–10.5)
Creatinine, Ser: 2.96 mg/dL — ABNORMAL HIGH (ref 0.4–1.5)
Phosphorus: 3.4 mg/dL (ref 2.3–4.6)
Potassium: 5 mEq/L (ref 3.5–5.1)

## 2010-07-04 LAB — HEMOGLOBIN AND HEMATOCRIT, BLOOD
HCT: 33.8 % — ABNORMAL LOW (ref 39.0–52.0)
Hemoglobin: 12.1 g/dL — ABNORMAL LOW (ref 13.0–17.0)

## 2010-07-05 LAB — HEMOGLOBIN AND HEMATOCRIT, BLOOD: HCT: 33 % — ABNORMAL LOW (ref 39.0–52.0)

## 2010-07-05 LAB — RENAL FUNCTION PANEL
BUN: 35 mg/dL — ABNORMAL HIGH (ref 6–23)
CO2: 21 mEq/L (ref 19–32)
Chloride: 111 mEq/L (ref 96–112)
Creatinine, Ser: 2.8 mg/dL — ABNORMAL HIGH (ref 0.4–1.5)
Glucose, Bld: 137 mg/dL — ABNORMAL HIGH (ref 70–99)

## 2010-07-10 LAB — RENAL FUNCTION PANEL
Albumin: 3.6 g/dL (ref 3.5–5.2)
CO2: 25 mEq/L (ref 19–32)
Chloride: 108 mEq/L (ref 96–112)
GFR calc Af Amer: 29 mL/min — ABNORMAL LOW (ref >60)
GFR calc non Af Amer: 24 mL/min — ABNORMAL LOW (ref >60)
Potassium: 4.8 mEq/L (ref 3.5–5.1)
Sodium: 138 mEq/L (ref 135–145)

## 2010-07-10 LAB — HEMOGLOBIN AND HEMATOCRIT, BLOOD: HCT: 34.7 % — ABNORMAL LOW (ref 39.0–52.0)

## 2010-07-19 LAB — RENAL FUNCTION PANEL
Calcium: 9 mg/dL (ref 8.4–10.5)
Creatinine, Ser: 2.97 mg/dL — ABNORMAL HIGH (ref 0.4–1.5)
GFR calc Af Amer: 27 mL/min — ABNORMAL LOW (ref >60)
GFR calc non Af Amer: 22 mL/min — ABNORMAL LOW (ref >60)
Phosphorus: 3.9 mg/dL (ref 2.3–4.6)
Sodium: 140 mEq/L (ref 135–145)

## 2010-07-19 LAB — HEMOGLOBIN AND HEMATOCRIT, BLOOD: Hemoglobin: 12.1 g/dL — ABNORMAL LOW (ref 13.0–17.0)

## 2010-07-20 LAB — RENAL FUNCTION PANEL
BUN: 40 mg/dL — ABNORMAL HIGH (ref 6–23)
CO2: 21 mEq/L (ref 19–32)
Glucose, Bld: 116 mg/dL — ABNORMAL HIGH (ref 70–99)
Phosphorus: 3.8 mg/dL (ref 2.3–4.6)
Potassium: 5.1 mEq/L (ref 3.5–5.1)

## 2010-07-20 LAB — HEMOGLOBIN AND HEMATOCRIT, BLOOD: Hemoglobin: 12.2 g/dL — ABNORMAL LOW (ref 13.0–17.0)

## 2010-07-21 LAB — HEMOGLOBIN AND HEMATOCRIT, BLOOD
HCT: 33.9 % — ABNORMAL LOW (ref 39.0–52.0)
Hemoglobin: 11.7 g/dL — ABNORMAL LOW (ref 13.0–17.0)

## 2010-07-21 LAB — RENAL FUNCTION PANEL
Albumin: 3.8 g/dL (ref 3.5–5.2)
BUN: 37 mg/dL — ABNORMAL HIGH (ref 6–23)
Chloride: 112 mEq/L (ref 96–112)
GFR calc non Af Amer: 24 mL/min — ABNORMAL LOW (ref >60)
Phosphorus: 3.8 mg/dL (ref 2.3–4.6)
Potassium: 4.6 mEq/L (ref 3.5–5.1)
Sodium: 140 mEq/L (ref 135–145)

## 2010-07-22 LAB — RENAL FUNCTION PANEL
Albumin: 3.4 g/dL — ABNORMAL LOW (ref 3.5–5.2)
BUN: 35 mg/dL — ABNORMAL HIGH (ref 6–23)
Calcium: 9.5 mg/dL (ref 8.4–10.5)
Chloride: 109 mEq/L (ref 96–112)
Creatinine, Ser: 2.68 mg/dL — ABNORMAL HIGH (ref 0.4–1.5)
GFR calc Af Amer: 30 mL/min — ABNORMAL LOW (ref >60)
GFR calc non Af Amer: 25 mL/min — ABNORMAL LOW (ref >60)
Phosphorus: 4.1 mg/dL (ref 2.3–4.6)

## 2010-07-22 LAB — HEMOGLOBIN AND HEMATOCRIT, BLOOD
HCT: 32.1 % — ABNORMAL LOW (ref 39.0–52.0)
Hemoglobin: 11.1 g/dL — ABNORMAL LOW (ref 13.0–17.0)

## 2010-07-23 LAB — RENAL FUNCTION PANEL
BUN: 23 mg/dL (ref 6–23)
CO2: 23 mEq/L (ref 19–32)
Calcium: 8.9 mg/dL (ref 8.4–10.5)
Chloride: 109 mEq/L (ref 96–112)
Creatinine, Ser: 2.69 mg/dL — ABNORMAL HIGH (ref 0.4–1.5)
GFR calc Af Amer: 30 mL/min — ABNORMAL LOW (ref >60)
GFR calc non Af Amer: 25 mL/min — ABNORMAL LOW (ref >60)
Glucose, Bld: 74 mg/dL (ref 70–99)

## 2010-07-24 LAB — RENAL FUNCTION PANEL
Albumin: 3.6 g/dL (ref 3.5–5.2)
BUN: 44 mg/dL — ABNORMAL HIGH (ref 6–23)
CO2: 20 mEq/L (ref 19–32)
Chloride: 111 mEq/L (ref 96–112)
Creatinine, Ser: 3.05 mg/dL — ABNORMAL HIGH (ref 0.4–1.5)
Potassium: 4.2 mEq/L (ref 3.5–5.1)

## 2010-07-24 LAB — HEMOGLOBIN AND HEMATOCRIT, BLOOD: HCT: 33.5 % — ABNORMAL LOW (ref 39.0–52.0)

## 2010-07-25 LAB — RENAL FUNCTION PANEL
Albumin: 3.4 g/dL — ABNORMAL LOW (ref 3.5–5.2)
BUN: 44 mg/dL — ABNORMAL HIGH (ref 6–23)
Calcium: 8.8 mg/dL (ref 8.4–10.5)
Glucose, Bld: 101 mg/dL — ABNORMAL HIGH (ref 70–99)
Phosphorus: 3.8 mg/dL (ref 2.3–4.6)
Sodium: 140 mEq/L (ref 135–145)

## 2010-07-25 LAB — HEMOGLOBIN AND HEMATOCRIT, BLOOD
HCT: 33.1 % — ABNORMAL LOW (ref 39.0–52.0)
Hemoglobin: 11.9 g/dL — ABNORMAL LOW (ref 13.0–17.0)

## 2010-07-26 LAB — RENAL FUNCTION PANEL
BUN: 33 mg/dL — ABNORMAL HIGH (ref 6–23)
Chloride: 111 mEq/L (ref 96–112)
Creatinine, Ser: 2.85 mg/dL — ABNORMAL HIGH (ref 0.4–1.5)
Glucose, Bld: 134 mg/dL — ABNORMAL HIGH (ref 70–99)
Potassium: 4.2 mEq/L (ref 3.5–5.1)

## 2010-07-27 LAB — RENAL FUNCTION PANEL
Albumin: 3.4 g/dL — ABNORMAL LOW (ref 3.5–5.2)
Chloride: 104 mEq/L (ref 96–112)
GFR calc Af Amer: 32 mL/min — ABNORMAL LOW (ref >60)
GFR calc non Af Amer: 26 mL/min — ABNORMAL LOW (ref >60)
Potassium: 4.2 mEq/L (ref 3.5–5.1)
Sodium: 137 mEq/L (ref 135–145)

## 2010-07-27 LAB — HEMOGLOBIN AND HEMATOCRIT, BLOOD: HCT: 34.8 % — ABNORMAL LOW (ref 39.0–52.0)

## 2010-07-28 LAB — RENAL FUNCTION PANEL
Albumin: 3.7 g/dL (ref 3.5–5.2)
CO2: 26 mEq/L (ref 19–32)
Calcium: 8.9 mg/dL (ref 8.4–10.5)
Creatinine, Ser: 2.63 mg/dL — ABNORMAL HIGH (ref 0.4–1.5)
GFR calc Af Amer: 31 mL/min — ABNORMAL LOW (ref >60)
GFR calc non Af Amer: 25 mL/min — ABNORMAL LOW (ref >60)
Phosphorus: 3.4 mg/dL (ref 2.3–4.6)

## 2010-07-28 LAB — HEMOGLOBIN AND HEMATOCRIT, BLOOD
HCT: 36.4 % — ABNORMAL LOW (ref 39.0–52.0)
Hemoglobin: 12.7 g/dL — ABNORMAL LOW (ref 13.0–17.0)

## 2010-07-29 ENCOUNTER — Encounter (HOSPITAL_COMMUNITY): Payer: Medicare Other | Attending: Internal Medicine

## 2010-07-29 ENCOUNTER — Encounter (HOSPITAL_COMMUNITY): Payer: Medicare Other

## 2010-07-29 DIAGNOSIS — N189 Chronic kidney disease, unspecified: Secondary | ICD-10-CM | POA: Insufficient documentation

## 2010-07-29 DIAGNOSIS — N039 Chronic nephritic syndrome with unspecified morphologic changes: Secondary | ICD-10-CM | POA: Insufficient documentation

## 2010-07-29 DIAGNOSIS — I129 Hypertensive chronic kidney disease with stage 1 through stage 4 chronic kidney disease, or unspecified chronic kidney disease: Secondary | ICD-10-CM | POA: Insufficient documentation

## 2010-07-29 DIAGNOSIS — D631 Anemia in chronic kidney disease: Secondary | ICD-10-CM | POA: Insufficient documentation

## 2010-08-01 LAB — RENAL FUNCTION PANEL
CO2: 25 mEq/L (ref 19–32)
Calcium: 9 mg/dL (ref 8.4–10.5)
Creatinine, Ser: 2.73 mg/dL — ABNORMAL HIGH (ref 0.4–1.5)
GFR calc Af Amer: 29 mL/min — ABNORMAL LOW (ref >60)
GFR calc non Af Amer: 24 mL/min — ABNORMAL LOW (ref >60)
Glucose, Bld: 150 mg/dL — ABNORMAL HIGH (ref 70–99)
Phosphorus: 3 mg/dL (ref 2.3–4.6)

## 2010-08-02 LAB — RENAL FUNCTION PANEL
Albumin: 3.4 g/dL — ABNORMAL LOW (ref 3.5–5.2)
BUN: 26 mg/dL — ABNORMAL HIGH (ref 6–23)
Calcium: 8.9 mg/dL (ref 8.4–10.5)
Chloride: 108 mEq/L (ref 96–112)
Creatinine, Ser: 2.23 mg/dL — ABNORMAL HIGH (ref 0.4–1.5)
GFR calc non Af Amer: 31 mL/min — ABNORMAL LOW (ref >60)
Phosphorus: 3.2 mg/dL (ref 2.3–4.6)

## 2010-08-26 ENCOUNTER — Other Ambulatory Visit: Payer: Self-pay | Admitting: Nephrology

## 2010-08-26 ENCOUNTER — Encounter (HOSPITAL_COMMUNITY): Payer: Medicare Other | Attending: Internal Medicine

## 2010-08-26 DIAGNOSIS — D631 Anemia in chronic kidney disease: Secondary | ICD-10-CM | POA: Insufficient documentation

## 2010-08-26 DIAGNOSIS — I129 Hypertensive chronic kidney disease with stage 1 through stage 4 chronic kidney disease, or unspecified chronic kidney disease: Secondary | ICD-10-CM | POA: Insufficient documentation

## 2010-08-26 DIAGNOSIS — N189 Chronic kidney disease, unspecified: Secondary | ICD-10-CM | POA: Insufficient documentation

## 2010-08-26 LAB — RENAL FUNCTION PANEL
BUN: 39 mg/dL — ABNORMAL HIGH (ref 6–23)
CO2: 23 mEq/L (ref 19–32)
Chloride: 107 mEq/L (ref 96–112)
Glucose, Bld: 159 mg/dL — ABNORMAL HIGH (ref 70–99)
Potassium: 4.2 mEq/L (ref 3.5–5.1)
Sodium: 139 mEq/L (ref 135–145)

## 2010-08-30 NOTE — Discharge Summary (Signed)
[name], [name] NO.:  [id number]   MEDICAL RECORD NO.:  [id number]          PATIENT TYPE:  INP   LOCATION:  A331                          FACILITY:  [facility]   PHYSICIAN:  Dalia Heading, M.D.  DATE OF BIRTH:  [birth year]   DATE OF ADMISSION:  06/23/2007  DATE OF DISCHARGE:  03/14/2009LH                               DISCHARGE SUMMARY   HOSPITAL COURSE SUMMARY:  The patient is a 60 year old black male with a  known history of peripheral vascular disease, hypertension, and renal  insufficiency who presented to [facility] with gangrene of the  left foot.  A surgery consultation was obtained.  The patient was noted  to have a right foot transmetatarsal amputation in the past by myself.  He subsequently was taken to the operating room on June 26, 2007, and  underwent a left foot transmetatarsal amputation of toes 2 through 5.  He tolerated the procedure well.  He did have some fevers  postoperatively.  He also had anemia of unknown etiology, though it was  suspected to be due to his renal insufficiency.  Final wound cultures  revealed pseudomonas which were sensitive to ciprofloxacin.   DISPOSITION:  The patient is being discharged home in good improving  condition on June 29, 2007.   DISCHARGE MEDICATIONS:  1. Baby aspirin 81 mg p.o. daily.  2. Lipitor 40 mg p.o. daily  3. Nifedipine XL 60 mg p.o. daily  4. Januvia 100 mg p.o. daily  5. Multivitamin 1 tablet p.o. daily  6. Cozaar 100 mg p.o. daily  7. Darvocet-N 100 one to two tablets p.o. q.6 h. p.r.n. pain  8. Ciprofloxacin 250 mg p.o. b.i.d. x2 weeks.   PRINCIPAL DIAGNOSES:  1. Gangrene, left foot.  2. Anemia.  3. Renal insufficiency.  4. Non-insulin-dependent diabetes mellitus.  5. Hypertension.   PRINCIPAL PROCEDURE:  Transmetatarsal amputation of left foot, toes 2  through 5 on June 26, 2007.      Dalia Heading, M.D.  Electronically Signed     MAJ/MEDQ  D:  06/29/2007  T:   06/29/2007  Job:  [id number]   cc:   Jorja Loa, M.D.  Fax: [phone]   Tesfaye D. Felecia Shelling, MD  Fax: [phone]

## 2010-08-30 NOTE — Group Therapy Note (Signed)
NAMEOLLIS, DAUDELIN                ACCOUNT NO.:  [id number]   MEDICAL RECORD NO.:  [id number]          PATIENT TYPE:  INP   LOCATION:  A331                          FACILITY:  [facility]   PHYSICIAN:  Edward L. Juanetta Gosling, M.D.DATE OF BIRTH:  01-21-1951   DATE OF PROCEDURE:  DATE OF DISCHARGE:                                 PROGRESS NOTE   Mr. Weisenburger said he feels well and he is hopeful of going home today.  He  said that there was some thought of him going home yesterday, but Dr.  Lovell Sheehan wanted him on a new medication for 24 hours before he left.  He  said he has his home medications already set up, which is good.  He has  no other new complaints.   PHYSICAL EXAMINATION:  Temperature 99.5, pulse 97, respirations 18,  blood pressure 144/78.  I did not undress his foot as it has just been  dressed, and Dr. Lovell Sheehan, I am sure, will see it before he goes.   ASSESSMENT:  1. He has renal failure, which seems to be improving.  2. Gangrenous left foot.  3. He has had am amputation and he does have Pseudomonas growing in      it.  He has had some fever.  4. He has diabetes mellitus type 2, which is pretty well controlled.   If he is discharged by Dr. Lovell Sheehan, I think that is okay.  He does say  that he has his medications,etc., set up at home.      Edward L. Juanetta Gosling, M.D.  Electronically Signed     ELH/MEDQ  D:  06/29/2007  T:  06/29/2007  Job:  [id number]

## 2010-08-30 NOTE — H&P (Signed)
Wayne [name], Wayne [name]                ACCOUNT NO.:  [id number]   MEDICAL RECORD NO.:  [id number]          PATIENT TYPE:  INP   LOCATION:  A331                          FACILITY:  [facility]   PHYSICIAN:  Tesfaye D. Felecia Shelling, MD   DATE OF BIRTH:  02/29/1951   DATE OF ADMISSION:  06/23/2007  DATE OF DISCHARGE:  LH                              HISTORY & PHYSICAL   CHIEF COMPLAINT:  Left foot wound.   HISTORY OF PRESENT ILLNESS:  This is a 60 year old male patient with a  history of multiple medical illnesses including diabetes mellitus,  hypertension and renal insufficiency who was admitted last night due to  infected left foot diabetic ulcer and fever.  The patient recently  noticed a small wound on his left foot which he was trying to treat at  home.  However, the wound continued to get worse and started draining  pusy material.  The patient spiked a fever of 103 degrees Fahrenheit  last night.  He then came to emergency room where he was evaluated.  The  patient was found to have necrotic and gangrenous wound on his left foot  around his toes.  The patient had a fever and he was started on IV  antibiotics and was admitted.   REVIEW OF SYSTEMS:  Patient has no headache, chest pain, nausea,  vomiting, abdominal pain, palpitation, dysuria, urgency or frequency of  urination.   PAST MEDICAL HISTORY:  1. Diabetes mellitus type 2.  2. Hypertension.  3. Chronic renal insufficiency.  4. Diabetic neuropathy.  5. History of right foot diabetic ulcer and status post amputation of      the third and fourth and fifth toes.  6. Anemia of chronic disease.   CURRENT MEDICATIONS:  1. Aspirin 81 mg daily.  2. Lipitor 40 mg daily.  3. Nifedipine 60 mg daily.  4. Januvia 100 mg daily.  5. Multivitamin one tablet daily.   SOCIAL HISTORY:  The patient is married.  Currently, he is disabled due  to his illness.  No history of alcohol, tobacco or substance abuse.   PHYSICAL EXAMINATION:  GENERAL:  The  patient is alert awake and  chronically sick looking.  VITAL SIGNS: Blood pressure 138/64, pulse 117, respiratory rate 22,  temperature 103.7 degrees Fahrenheit.  HEENT:  Pupils are equal and reactive.  NECK:  Supple.  CHEST: Clear.  Lung fields good air entry.  CARDIOVASCULAR:  First and second heart sound heard.  Tachycardiac.  ABDOMEN:  Soft and lax.  Bowel sounds positive.  No mass or  organomegaly.  EXTREMITIES:  The patient has gangrenous change of the left second and  third toe.  There is a wound on the plantar side around the toes with  pusy discharge and necrotic changes.  The patient also has amputation of  the right third, fourth and fifth toe.   LABS ON ADMISSION:  Sodium 131, potassium 4.3, chloride 98, carbon  dioxide 22, blood sugar 360, BUN 30, creatinine 3.47, calcium 8.6.  CBC:  WBC 10.7, hemoglobin 9.7, hematocrit 27.7, platelets 168.   ASSESSMENT:  1. Left  diabetic foot ulcer with gangrenous change of the left second      and third toes.  2. Fever, probably as a result of sepsis secondary to the infected      wound.  3. Diabetes mellitus poorly controlled.  4. Chronic renal failure.  5. Hypertension.  6. Anemia of chronic disease.   PLAN:  Will do continue the patient on combination of Unasyn and  vancomycin.  Will continue septic workup.  Will do surgical consult for  evaluation of the wound and possible amputation of the toes.  Will do  nephrology consult.  We will continue the patient on his regular  medications and also will start the patient on insulin to control his  diabetes.      Tesfaye D. Felecia Shelling, MD  Electronically Signed     TDF/MEDQ  D:  06/24/2007  T:  06/24/2007  Job:  [id number]

## 2010-08-30 NOTE — Op Note (Signed)
NAMELYALL, FACIANE NO.:  [id number]   MEDICAL RECORD NO.:  [id number]          PATIENT TYPE:  INP   LOCATION:  A331                          FACILITY:  [facility]   PHYSICIAN:  Dalia Heading, M.D.  DATE OF BIRTH:  13-Nov-1950   DATE OF PROCEDURE:  06/26/2007  DATE OF DISCHARGE:                               OPERATIVE REPORT   PREOPERATIVE DIAGNOSIS:  Gangrene, left foot.   POSTOPERATIVE DIAGNOSIS:  Gangrene, left foot.   OPERATION/PROCEDURE:  Transmetatarsal amputation of left foot digits 2  through 5.   SURGEON:  Dalia Heading, M.D.   ANESTHESIA:  Spinal.   INDICATIONS:  The patient is a 60 year old black male with renal  insufficiency, peripheral vascular disease, and diabetes mellitus who  presents with gangrene of left foot.  The toes 2 through 5 are involved  with gangrenous changes.  His great toe was still intact.  He is status  post right foot transmetatarsal amputation in the past.  He now presents  for transmetatarsal amputation of the left foot, sparing the great toe.  Risks and benefits of the procedure including bleeding and infection  were fully explained to the patient who gave informed consent.   PROCEDURE NOTE:  The patient was placed in the supine position after  spinal anesthesia was administered.  The left foot was prepped and  draped in the usual sterile technique with Betadine.  Surgical site  confirmation was performed.   An elliptical incision was made around toes 2 through 5.  The dissection  was taken down to the metatarsal joint.  A transmetatarsal amputation  was then performed in total using the power saw.  The toes were then  removed from the operative field and sent to pathology for further  examination.  Any bleeding was controlled using Bovie electrocautery.  The wound was irrigated with normal saline.  Xeroform was placed in the  base of the tissue.  There was some blood flow in this region.  Any  gangrenous tissue was  debrided.  The skin was reapproximated using 2-0  Prolene interrupted sutures.  Betadine ointment and dry sterile dressing  were applied.   All tape and needle counts correct at the end of the procedure.  The  patient was transferred to PACU in stable condition.   COMPLICATIONS:  None.   SPECIMEN:  Left toes, 2 through 5.   ESTIMATED BLOOD LOSS:  Minimal.      Dalia Heading, M.D.  Electronically Signed     MAJ/MEDQ  D:  06/26/2007  T:  06/27/2007  Job:  [id number]   cc:   Tesfaye D. Felecia Shelling, MD  Fax: [phone]   Jorja Loa, M.D.  Fax: [phone]

## 2010-08-30 NOTE — Consult Note (Signed)
[name], [name]                ACCOUNT NO.:  [id number]   MEDICAL RECORD NO.:  [id number]          PATIENT TYPE:  INP   LOCATION:  A331                          FACILITY:  [facility]   PHYSICIAN:  Jorja Loa, M.D.DATE OF BIRTH:  1950/12/09   DATE OF CONSULTATION:  06/24/2007  DATE OF DISCHARGE:                                 CONSULTATION   REASON FOR CONSULTATION:  Worsening renal failure.   Wayne [name] is a 60 year old gentleman with past medical history of  diabetes, history of hypertension, sarcoidosis, presently admitted  because of right necrotic, gangrenous left toe.  Wayne [name] has a  previous history of diabetic foot ulcer, status post surgery on the  right.  Presently he came with a history of pain, fever within the last  couple of days, and he was found third, fourth and fifth right toe  ulcer.  At this moment he denies any nausea or vomiting.  However, since  his creatinine has increased from a baseline of 2.2 to 3.4, a consult is  called.  He has diabetic nephropathy.  Baseline creatinine about 2 to  2.9 for the last 4 years.  At this moment he denies any shortness of  breath, dizziness or lightheadedness.   PAST MEDICAL HISTORY:  1. Longstanding history of diabetes.  2. History of hypertension.  3. He has chronic renal failure with baseline creatinine around 2 to      2.9.  His last blood work in February, creatinine was 2.9 with GFR      of 36 cc/min., hence, stage III.  4. He has history of anemia.  5. History of sarcoidosis.  6. History of splenomegaly.  7. History of hypercalcemia.  8. History of diabetic foot ulcer.  9. History of diabetic neuropathy.  10.He has also polyclonal gammopathy, i.e., proteinuria.   SURGICAL HISTORY:  Has history of cataract surgery.   His medications at this moment consist of:  1. Unasyn 1.5 g IV q.6 h.  2. Aspirin 81 mg p.o. daily.  3. Lipitor 40 mg p.o. daily.  4. Novolin insulin.  5. Lantus insulin.  6. Procardia 60  mg p.o. daily.  7. Januvia 50 mg p.o. daily.  8. Vancomycin 1000 mg IV q.24 h.  9. He is getting some pain medication.   ALLERGIES:  No known allergy.   SOCIAL HISTORY:  No history of smoking.  No history of alcohol abuse.   FAMILY HISTORY:  No history of renal insufficiency.   REVIEW OF SYSTEMS:  His main complaint seems to be some fevers, chills  over the weekend.  Otherwise he does not have any nausea, no vomiting,  no shortness of breath.  No dizziness or lightheadedness.  At this  moment he also denies any swelling of his legs.   EXAMINATION:  His temperature is 98.5, blood pressure 177/69, pulse of  99.  CHEST:  Clear to auscultation.  No rales or rhonchi.  No egophony.  HEART:  Regular rate and rhythm.  No murmur.  ABDOMEN:  Soft, positive bowel sounds.  EXTREMITIES:  He does not have any edema.  His white blood cell count is 10.7, hemoglobin 9.7, hematocrit 27.7.  Sodium 131, potassium 4.3, BUN is 30, creatinine 3.47 with a GFR of 22  mL/min.   ASSESSMENT:  1. Renal insufficiency, at this moment acute on chronic.  His      creatinine has increased significantly and this possibly could be      from acute tubular necrosis; however, a superimposed prerenal      syndrome at this moment cannot be ruled out.  His baseline      creatinine is usually between 2.2 to 2.9 and has been like that for      more than 4 years.  Initially when he was seen his creatinine has      been as high as 3 but has improved since then.  Hence, basically      the patient with stage III chronic renal failure.  Probably at this      moment we may be dealing with superimposed acute renal      insufficiency.  2. Hypertension.  He is on Procardia.  His blood pressure seems to be      controlled very well.  3. History of diabetes.  He is on insulin.  4. History of anemia, anemia of chronic disease.  His H&H seems to be      declining.  We do not have any recent iron studies.  5. History of  hypercalcemia.  Thought to be secondary to sarcoidosis.      Calcium has improved.  6. History of sarcoidosis.  Patient with some pulmonary involvement      and also splenomegaly.  7. History of diabetic foot ulcer.  8. History of diabetic neuropathy.   RECOMMENDATIONS:  I agree with hydration.  We will check his 24-hour  urine for protein and immunoelectrophoresis.  Previously the patient  seems to have a polyclonal gammopathy.  At this moment we may consider  starting him on ARBs.  I will check his UA.  We will do also iron  studies.  Will continue his other treatment.      Jorja Loa, M.D.  Electronically Signed     BB/MEDQ  D:  06/24/2007  T:  06/25/2007  Job:  [id number]

## 2010-08-30 NOTE — Op Note (Signed)
Wayne [name], LECLAIRE                ACCOUNT NO.:  [id number]   MEDICAL RECORD NO.:  [id number]          PATIENT TYPE:  AMB   LOCATION:  DAY                           FACILITY:  [facility]   PHYSICIAN:  Lionel December, M.D.    DATE OF BIRTH:  [birth year]   DATE OF PROCEDURE:  09/24/2006  DATE OF DISCHARGE:                               OPERATIVE REPORT   PROCEDURE:  Colonoscopy.   INDICATION:  Quandre is a 60 year old African-American male who is  undergoing average risk screening colonoscopy.  Procedure risks are  reviewed with the patient, informed consent was obtained.   MEDS FOR CONSCIOUS SEDATION:  1. Demerol 50 mg IV.  2. Versed 5 mg IV.   FINDINGS:  Procedure performed in endoscopy suite.  The patient's vital  signs and O2 sat were monitored during the procedure and remained  stable.  The patient was placed in the left lateral recumbent position  and rectal examination performed.  No abnormality noted on external or  digital exam.  A Pentax videoscope was placed in the rectum and advanced  under vision into sigmoid colon and beyond.  Preparation was  satisfactory, although in some places he still had some stool which had  to be washed away.  The scope was easily passed in the cecum which was  identified by appendiceal orifice and ileocecal valve.  A short segment  of __________ was also examined and was normal.  As the scope was  withdrawn, the colonic mucosa was examined for the second time and there  were no polyps or other mucosal abnormalities.  Rectal mucosa was  normal.  The scope was retroflexed to examine anorectal junction, small  hemorrhoids noted below the dentate line.   FINAL DIAGNOSIS:  Small external hemorrhoids, otherwise normal  colonoscopy.   RECOMMENDATIONS:  1. He will resume his usual meds and diet.  2. Yearly hemoccults, etc.  3. Screening in 10 years from now.      Lionel December, M.D.  Electronically Signed    NR/MEDQ  D:  09/24/2006  T:  09/24/2006   Job:  [id number]   cc:   Tesfaye D. Felecia Shelling, MD  Fax: [phone]

## 2010-09-23 ENCOUNTER — Other Ambulatory Visit (HOSPITAL_COMMUNITY): Payer: Self-pay | Admitting: Oncology

## 2010-09-23 ENCOUNTER — Encounter (HOSPITAL_COMMUNITY): Payer: Medicare Other | Attending: Internal Medicine

## 2010-09-23 DIAGNOSIS — N189 Chronic kidney disease, unspecified: Secondary | ICD-10-CM | POA: Insufficient documentation

## 2010-09-23 DIAGNOSIS — I129 Hypertensive chronic kidney disease with stage 1 through stage 4 chronic kidney disease, or unspecified chronic kidney disease: Secondary | ICD-10-CM | POA: Insufficient documentation

## 2010-09-23 DIAGNOSIS — D631 Anemia in chronic kidney disease: Secondary | ICD-10-CM | POA: Insufficient documentation

## 2010-09-23 LAB — RENAL FUNCTION PANEL
Albumin: 3.8 g/dL (ref 3.5–5.2)
BUN: 34 mg/dL — ABNORMAL HIGH (ref 6–23)
CO2: 22 mEq/L (ref 19–32)
Calcium: 9.3 mg/dL (ref 8.4–10.5)
Chloride: 108 mEq/L (ref 96–112)
Creatinine, Ser: 3.09 mg/dL — ABNORMAL HIGH (ref 0.4–1.5)
GFR calc Af Amer: 25 mL/min — ABNORMAL LOW (ref >60)
GFR calc non Af Amer: 21 mL/min — ABNORMAL LOW (ref >60)
Glucose, Bld: 169 mg/dL — ABNORMAL HIGH (ref 70–99)
Phosphorus: 3.2 mg/dL (ref 2.3–4.6)
Potassium: 4.7 mEq/L (ref 3.5–5.1)
Sodium: 141 mEq/L (ref 135–145)

## 2010-09-23 LAB — HEMOGLOBIN AND HEMATOCRIT, BLOOD
HCT: 36.2 % — ABNORMAL LOW (ref 39.0–52.0)
Hemoglobin: 11.9 g/dL — ABNORMAL LOW (ref 13.0–17.0)

## 2010-10-19 ENCOUNTER — Emergency Department (HOSPITAL_COMMUNITY)
Admission: EM | Admit: 2010-10-19 | Discharge: 2010-10-19 | Disposition: A | Discharge status: Home / Self Care | Payer: Medicare Other | Attending: Emergency Medicine | Admitting: Emergency Medicine

## 2010-10-19 DIAGNOSIS — W57XXXA Bitten or stung by nonvenomous insect and other nonvenomous arthropods, initial encounter: Secondary | ICD-10-CM | POA: Insufficient documentation

## 2010-10-19 DIAGNOSIS — Z79899 Other long term (current) drug therapy: Secondary | ICD-10-CM | POA: Insufficient documentation

## 2010-10-19 DIAGNOSIS — S40269A Insect bite (nonvenomous) of unspecified shoulder, initial encounter: Secondary | ICD-10-CM | POA: Insufficient documentation

## 2010-10-21 ENCOUNTER — Other Ambulatory Visit: Payer: Self-pay | Admitting: Nephrology

## 2010-10-21 ENCOUNTER — Encounter (HOSPITAL_COMMUNITY): Payer: Medicare Other | Attending: Internal Medicine

## 2010-10-21 DIAGNOSIS — D631 Anemia in chronic kidney disease: Secondary | ICD-10-CM | POA: Insufficient documentation

## 2010-10-21 DIAGNOSIS — I129 Hypertensive chronic kidney disease with stage 1 through stage 4 chronic kidney disease, or unspecified chronic kidney disease: Secondary | ICD-10-CM | POA: Insufficient documentation

## 2010-10-21 DIAGNOSIS — N189 Chronic kidney disease, unspecified: Secondary | ICD-10-CM | POA: Insufficient documentation

## 2010-10-21 LAB — HEMOGLOBIN AND HEMATOCRIT, BLOOD
HCT: 35.6 % — ABNORMAL LOW (ref 39.0–52.0)
Hemoglobin: 12 g/dL — ABNORMAL LOW (ref 13.0–17.0)

## 2010-10-21 LAB — RENAL FUNCTION PANEL
Albumin: 3.7 g/dL (ref 3.5–5.2)
BUN: 34 mg/dL — ABNORMAL HIGH (ref 6–23)
Chloride: 107 mEq/L (ref 96–112)
Creatinine, Ser: 2.97 mg/dL — ABNORMAL HIGH (ref 0.50–1.35)
GFR calc non Af Amer: 22 mL/min — ABNORMAL LOW (ref >60)
Phosphorus: 2.9 mg/dL (ref 2.3–4.6)
Potassium: 3.9 mEq/L (ref 3.5–5.1)

## 2010-11-18 ENCOUNTER — Encounter (HOSPITAL_COMMUNITY): Payer: Medicare Other | Attending: Internal Medicine

## 2010-11-18 DIAGNOSIS — D649 Anemia, unspecified: Secondary | ICD-10-CM

## 2010-11-18 LAB — RENAL FUNCTION PANEL
BUN: 31 mg/dL — ABNORMAL HIGH (ref 6–23)
CO2: 19 mEq/L (ref 19–32)
GFR calc Af Amer: 28 mL/min — ABNORMAL LOW (ref >60)
Glucose, Bld: 95 mg/dL (ref 70–99)
Potassium: 4.5 mEq/L (ref 3.5–5.1)
Sodium: 140 mEq/L (ref 135–145)

## 2010-11-18 MED ORDER — EPOETIN ALFA 10000 UNIT/ML IJ SOLN
6000.0000 [IU] | Freq: Once | INTRAMUSCULAR | Status: AC
  Administered 2010-11-18: 6000 [IU] via SUBCUTANEOUS

## 2010-11-18 MED ORDER — EPOETIN ALFA 10000 UNIT/ML IJ SOLN
INTRAMUSCULAR | Status: AC
  Administered 2010-11-18: 6000 [IU] via SUBCUTANEOUS
  Filled 2010-11-18: qty 1

## 2010-11-18 NOTE — Progress Notes (Signed)
Procrit 6,ooo unit give sq lower right abd.  Specimen obtained from lt ac for labs.  Tolerated well

## 2010-12-20 ENCOUNTER — Ambulatory Visit (HOSPITAL_COMMUNITY): Payer: Medicare Other

## 2010-12-23 ENCOUNTER — Ambulatory Visit (HOSPITAL_COMMUNITY): Payer: Medicare Other

## 2011-01-04 LAB — RENAL FUNCTION PANEL
Albumin: 3.7
Calcium: 9.6
GFR calc Af Amer: 37 — ABNORMAL LOW
GFR calc non Af Amer: 31 — ABNORMAL LOW
Phosphorus: 3.3
Potassium: 4.6
Sodium: 140

## 2011-01-04 LAB — HEMOGLOBIN AND HEMATOCRIT, BLOOD: Hemoglobin: 11.8 — ABNORMAL LOW

## 2011-01-06 LAB — RENAL FUNCTION PANEL
BUN: 27 — ABNORMAL HIGH
Calcium: 9.2
Creatinine, Ser: 2.29 — ABNORMAL HIGH
Glucose, Bld: 215 — ABNORMAL HIGH
Phosphorus: 3.4

## 2011-01-06 LAB — HEMOGLOBIN AND HEMATOCRIT, BLOOD
HCT: 34.1 — ABNORMAL LOW
Hemoglobin: 11.8 — ABNORMAL LOW

## 2011-01-09 LAB — BASIC METABOLIC PANEL
BUN: 23
BUN: 30 — ABNORMAL HIGH
BUN: 30 — ABNORMAL HIGH
BUN: 33 — ABNORMAL HIGH
CO2: 22
CO2: 24
CO2: 24
Calcium: 7.7 — ABNORMAL LOW
Calcium: 8.2 — ABNORMAL LOW
Calcium: 8.5
Calcium: 8.6
Chloride: 105
Chloride: 110
Creatinine, Ser: 2.48 — ABNORMAL HIGH
Creatinine, Ser: 2.56 — ABNORMAL HIGH
Creatinine, Ser: 2.95 — ABNORMAL HIGH
Creatinine, Ser: 3.33 — ABNORMAL HIGH
GFR calc Af Amer: 27 — ABNORMAL LOW
GFR calc Af Amer: 32 — ABNORMAL LOW
GFR calc non Af Amer: 18 — ABNORMAL LOW
GFR calc non Af Amer: 19 — ABNORMAL LOW
GFR calc non Af Amer: 22 — ABNORMAL LOW
GFR calc non Af Amer: 26 — ABNORMAL LOW
Glucose, Bld: 123 — ABNORMAL HIGH
Glucose, Bld: 171 — ABNORMAL HIGH
Glucose, Bld: 360 — ABNORMAL HIGH
Potassium: 3.9
Sodium: 131 — ABNORMAL LOW

## 2011-01-09 LAB — DIFFERENTIAL
Basophils Absolute: 0
Basophils Absolute: 0
Basophils Absolute: 0
Basophils Relative: 0
Basophils Relative: 0
Basophils Relative: 0
Eosinophils Absolute: 0.1
Lymphocytes Relative: 4 — ABNORMAL LOW
Lymphocytes Relative: 5 — ABNORMAL LOW
Lymphs Abs: 0.6 — ABNORMAL LOW
Lymphs Abs: 0.6 — ABNORMAL LOW
Monocytes Absolute: 1.2 — ABNORMAL HIGH
Monocytes Relative: 10
Monocytes Relative: 12
Monocytes Relative: 12
Monocytes Relative: 13 — ABNORMAL HIGH
Neutro Abs: 7.1
Neutro Abs: 7.2
Neutro Abs: 9.2 — ABNORMAL HIGH
Neutrophils Relative %: 76
Neutrophils Relative %: 82 — ABNORMAL HIGH
Neutrophils Relative %: 82 — ABNORMAL HIGH

## 2011-01-09 LAB — CBC
HCT: 23.8 — ABNORMAL LOW
HCT: 30 — ABNORMAL LOW
Hemoglobin: 9.7 — ABNORMAL LOW
MCHC: 34.8
MCHC: 35
MCV: 85.6
MCV: 85.9
MCV: 86
Platelets: 167
Platelets: 189
Platelets: 196
RBC: 2.77 — ABNORMAL LOW
RBC: 3.49 — ABNORMAL LOW
RBC: 3.51 — ABNORMAL LOW
RBC: 3.61 — ABNORMAL LOW
RDW: 13.8
RDW: 14.3
RDW: 14.6
WBC: 12.3 — ABNORMAL HIGH
WBC: 9.1

## 2011-01-09 LAB — UIFE/LIGHT CHAINS/TP QN, 24-HR UR
Albumin, U: DETECTED
Alpha 1, Urine: DETECTED — AB
Alpha 2, Urine: DETECTED — AB
Free Kappa Lt Chains,Ur: 38.7 — ABNORMAL HIGH (ref 0.04–1.51)
Gamma Globulin, Urine: DETECTED — AB

## 2011-01-09 LAB — CULTURE, BLOOD (ROUTINE X 2)
Culture: NO GROWTH
Culture: NO GROWTH
Report Status: 3132009
Report Status: 3132009
Report Status: 3162009

## 2011-01-09 LAB — CROSSMATCH: ABO/RH(D): O POS

## 2011-01-09 LAB — HEMOGLOBIN AND HEMATOCRIT, BLOOD
HCT: 31.3 — ABNORMAL LOW
HCT: 34.4 — ABNORMAL LOW
Hemoglobin: 11.9 — ABNORMAL LOW

## 2011-01-09 LAB — RENAL FUNCTION PANEL
Albumin: 3.1 — ABNORMAL LOW
BUN: 33 — ABNORMAL HIGH
CO2: 23
Chloride: 101
Potassium: 4.1

## 2011-01-09 LAB — FERRITIN: Ferritin: 205 (ref 22–322)

## 2011-01-09 LAB — IRON AND TIBC

## 2011-01-09 LAB — PHOSPHORUS: Phosphorus: 2.6

## 2011-01-10 LAB — HEMOGLOBIN AND HEMATOCRIT, BLOOD: HCT: 32.9 — ABNORMAL LOW

## 2011-01-10 LAB — RENAL FUNCTION PANEL
Albumin: 2.9 — ABNORMAL LOW
Calcium: 9.6
Creatinine, Ser: 2.6 — ABNORMAL HIGH
GFR calc Af Amer: 31 — ABNORMAL LOW
GFR calc non Af Amer: 26 — ABNORMAL LOW

## 2011-01-12 LAB — RENAL FUNCTION PANEL
BUN: 38 — ABNORMAL HIGH
CO2: 21
CO2: 21
Calcium: 9.9
Calcium: 9.7
Chloride: 111
Creatinine, Ser: 2.3 — ABNORMAL HIGH
Creatinine, Ser: 2.45 — ABNORMAL HIGH
GFR calc Af Amer: 33 — ABNORMAL LOW
GFR calc non Af Amer: 27 — ABNORMAL LOW
Glucose, Bld: 85

## 2011-01-13 LAB — HEMOGLOBIN AND HEMATOCRIT, BLOOD: HCT: 32.9 — ABNORMAL LOW

## 2011-01-13 LAB — RENAL FUNCTION PANEL
Albumin: 3.6
BUN: 40 — ABNORMAL HIGH
CO2: 23
Chloride: 111
Creatinine, Ser: 2.58 — ABNORMAL HIGH
Glucose, Bld: 126 — ABNORMAL HIGH
Potassium: 4.6

## 2011-01-16 LAB — RENAL FUNCTION PANEL
Albumin: 3.6
Calcium: 9.5
GFR calc Af Amer: 30 — ABNORMAL LOW
GFR calc non Af Amer: 25 — ABNORMAL LOW
Glucose, Bld: 92
Phosphorus: 3.8
Potassium: 4
Sodium: 141

## 2011-01-16 LAB — HEMOGLOBIN AND HEMATOCRIT, BLOOD
HCT: 35 — ABNORMAL LOW
Hemoglobin: 12.1 — ABNORMAL LOW

## 2011-01-17 LAB — RENAL FUNCTION PANEL
CO2: 22
Calcium: 8.8
Creatinine, Ser: 2.48 — ABNORMAL HIGH
GFR calc Af Amer: 33 — ABNORMAL LOW
GFR calc non Af Amer: 27 — ABNORMAL LOW
Phosphorus: 3.7
Sodium: 142

## 2011-01-17 LAB — HEMOGLOBIN AND HEMATOCRIT, BLOOD: Hemoglobin: 12.1 — ABNORMAL LOW

## 2011-01-19 LAB — HEMOGLOBIN AND HEMATOCRIT, BLOOD: HCT: 35.9 % — ABNORMAL LOW (ref 39.0–52.0)

## 2011-01-19 LAB — RENAL FUNCTION PANEL
Albumin: 3.6 g/dL (ref 3.5–5.2)
BUN: 35 mg/dL — ABNORMAL HIGH (ref 6–23)
CO2: 26 mEq/L (ref 19–32)
Chloride: 108 mEq/L (ref 96–112)
GFR calc non Af Amer: 27 mL/min — ABNORMAL LOW (ref >60)
Potassium: 4.4 mEq/L (ref 3.5–5.1)

## 2011-01-19 LAB — PROTEIN / CREATININE RATIO, URINE
Creatinine, Urine: 114.49 mg/dL
Protein Creatinine Ratio: 0.58 — ABNORMAL HIGH (ref 0.00–0.15)
Total Protein, Urine: 66 mg/dL

## 2011-01-24 LAB — RENAL FUNCTION PANEL
CO2: 23
Calcium: 9.4
Creatinine, Ser: 2.69 — ABNORMAL HIGH
GFR calc Af Amer: 30 — ABNORMAL LOW
Glucose, Bld: 177 — ABNORMAL HIGH
Phosphorus: 3.4

## 2011-01-24 LAB — HEMOGLOBIN AND HEMATOCRIT, BLOOD: Hemoglobin: 12.1 — ABNORMAL LOW

## 2011-01-25 LAB — RENAL FUNCTION PANEL
BUN: 26 — ABNORMAL HIGH
CO2: 26
Glucose, Bld: 188 — ABNORMAL HIGH
Potassium: 4.1
Sodium: 137

## 2011-01-26 LAB — RENAL FUNCTION PANEL
Albumin: 3.6
BUN: 33 — ABNORMAL HIGH
GFR calc Af Amer: 36 — ABNORMAL LOW
GFR calc non Af Amer: 30 — ABNORMAL LOW
Phosphorus: 3.5
Potassium: 4.1
Sodium: 139

## 2011-01-26 LAB — HEMOGLOBIN AND HEMATOCRIT, BLOOD
HCT: 33.8 — ABNORMAL LOW
Hemoglobin: 11.6 — ABNORMAL LOW

## 2011-01-27 LAB — RENAL FUNCTION PANEL
Albumin: 3.3 — ABNORMAL LOW
BUN: 34 — ABNORMAL HIGH
Calcium: 8.6
Chloride: 107
Creatinine, Ser: 2.47 — ABNORMAL HIGH

## 2011-01-27 LAB — HEMOGLOBIN AND HEMATOCRIT, BLOOD
HCT: 32.5 — ABNORMAL LOW
Hemoglobin: 11.1 — ABNORMAL LOW

## 2011-01-30 LAB — RENAL FUNCTION PANEL
CO2: 23
Chloride: 106
Creatinine, Ser: 2.43 — ABNORMAL HIGH
GFR calc Af Amer: 34 — ABNORMAL LOW
GFR calc non Af Amer: 28 — ABNORMAL LOW

## 2011-02-01 LAB — RENAL FUNCTION PANEL
CO2: 22
Calcium: 8.5
GFR calc Af Amer: 31 — ABNORMAL LOW
GFR calc non Af Amer: 26 — ABNORMAL LOW
Phosphorus: 3.8
Sodium: 136

## 2011-02-01 LAB — HEMOGLOBIN AND HEMATOCRIT, BLOOD: Hemoglobin: 11.2 — ABNORMAL LOW

## 2011-05-02 DIAGNOSIS — E119 Type 2 diabetes mellitus without complications: Secondary | ICD-10-CM | POA: Diagnosis not present

## 2011-05-02 DIAGNOSIS — E1149 Type 2 diabetes mellitus with other diabetic neurological complication: Secondary | ICD-10-CM | POA: Diagnosis not present

## 2011-07-11 DIAGNOSIS — E1149 Type 2 diabetes mellitus with other diabetic neurological complication: Secondary | ICD-10-CM | POA: Diagnosis not present

## 2011-07-11 DIAGNOSIS — E119 Type 2 diabetes mellitus without complications: Secondary | ICD-10-CM | POA: Diagnosis not present

## 2011-08-31 DIAGNOSIS — N189 Chronic kidney disease, unspecified: Secondary | ICD-10-CM | POA: Diagnosis not present

## 2011-08-31 DIAGNOSIS — Z79899 Other long term (current) drug therapy: Secondary | ICD-10-CM | POA: Diagnosis not present

## 2011-08-31 DIAGNOSIS — I10 Essential (primary) hypertension: Secondary | ICD-10-CM | POA: Diagnosis not present

## 2011-08-31 DIAGNOSIS — D649 Anemia, unspecified: Secondary | ICD-10-CM | POA: Diagnosis not present

## 2011-09-05 DIAGNOSIS — N184 Chronic kidney disease, stage 4 (severe): Secondary | ICD-10-CM | POA: Diagnosis not present

## 2011-09-05 DIAGNOSIS — E1129 Type 2 diabetes mellitus with other diabetic kidney complication: Secondary | ICD-10-CM | POA: Diagnosis not present

## 2011-09-05 DIAGNOSIS — I10 Essential (primary) hypertension: Secondary | ICD-10-CM | POA: Diagnosis not present

## 2011-09-05 DIAGNOSIS — D649 Anemia, unspecified: Secondary | ICD-10-CM | POA: Diagnosis not present

## 2011-10-03 DIAGNOSIS — E119 Type 2 diabetes mellitus without complications: Secondary | ICD-10-CM | POA: Diagnosis not present

## 2011-10-03 DIAGNOSIS — E1149 Type 2 diabetes mellitus with other diabetic neurological complication: Secondary | ICD-10-CM | POA: Diagnosis not present

## 2011-10-03 DIAGNOSIS — D649 Anemia, unspecified: Secondary | ICD-10-CM | POA: Diagnosis not present

## 2011-10-03 DIAGNOSIS — I10 Essential (primary) hypertension: Secondary | ICD-10-CM | POA: Diagnosis not present

## 2011-10-31 DIAGNOSIS — R809 Proteinuria, unspecified: Secondary | ICD-10-CM | POA: Diagnosis not present

## 2011-10-31 DIAGNOSIS — N189 Chronic kidney disease, unspecified: Secondary | ICD-10-CM | POA: Diagnosis not present

## 2011-10-31 DIAGNOSIS — I10 Essential (primary) hypertension: Secondary | ICD-10-CM | POA: Diagnosis not present

## 2011-10-31 DIAGNOSIS — D649 Anemia, unspecified: Secondary | ICD-10-CM | POA: Diagnosis not present

## 2011-10-31 DIAGNOSIS — Z79899 Other long term (current) drug therapy: Secondary | ICD-10-CM | POA: Diagnosis not present

## 2011-10-31 DIAGNOSIS — E559 Vitamin D deficiency, unspecified: Secondary | ICD-10-CM | POA: Diagnosis not present

## 2011-11-07 DIAGNOSIS — N184 Chronic kidney disease, stage 4 (severe): Secondary | ICD-10-CM | POA: Diagnosis not present

## 2011-11-07 DIAGNOSIS — R809 Proteinuria, unspecified: Secondary | ICD-10-CM | POA: Diagnosis not present

## 2011-11-07 DIAGNOSIS — E1129 Type 2 diabetes mellitus with other diabetic kidney complication: Secondary | ICD-10-CM | POA: Diagnosis not present

## 2011-11-07 DIAGNOSIS — E1165 Type 2 diabetes mellitus with hyperglycemia: Secondary | ICD-10-CM | POA: Diagnosis not present

## 2011-11-07 DIAGNOSIS — N2581 Secondary hyperparathyroidism of renal origin: Secondary | ICD-10-CM | POA: Diagnosis not present

## 2011-11-22 DIAGNOSIS — H579 Unspecified disorder of eye and adnexa: Secondary | ICD-10-CM | POA: Diagnosis not present

## 2011-11-22 DIAGNOSIS — H25019 Cortical age-related cataract, unspecified eye: Secondary | ICD-10-CM | POA: Diagnosis not present

## 2011-11-22 DIAGNOSIS — E11359 Type 2 diabetes mellitus with proliferative diabetic retinopathy without macular edema: Secondary | ICD-10-CM | POA: Diagnosis not present

## 2011-11-22 DIAGNOSIS — E1139 Type 2 diabetes mellitus with other diabetic ophthalmic complication: Secondary | ICD-10-CM | POA: Diagnosis not present

## 2011-12-12 DIAGNOSIS — E1149 Type 2 diabetes mellitus with other diabetic neurological complication: Secondary | ICD-10-CM | POA: Diagnosis not present

## 2011-12-12 DIAGNOSIS — E119 Type 2 diabetes mellitus without complications: Secondary | ICD-10-CM | POA: Diagnosis not present

## 2012-01-16 DIAGNOSIS — Z23 Encounter for immunization: Secondary | ICD-10-CM | POA: Diagnosis not present

## 2012-01-16 DIAGNOSIS — I10 Essential (primary) hypertension: Secondary | ICD-10-CM | POA: Diagnosis not present

## 2012-01-16 DIAGNOSIS — E119 Type 2 diabetes mellitus without complications: Secondary | ICD-10-CM | POA: Diagnosis not present

## 2012-01-16 DIAGNOSIS — E7800 Pure hypercholesterolemia, unspecified: Secondary | ICD-10-CM | POA: Diagnosis not present

## 2012-02-08 DIAGNOSIS — Z79899 Other long term (current) drug therapy: Secondary | ICD-10-CM | POA: Diagnosis not present

## 2012-02-08 DIAGNOSIS — E559 Vitamin D deficiency, unspecified: Secondary | ICD-10-CM | POA: Diagnosis not present

## 2012-02-08 DIAGNOSIS — R809 Proteinuria, unspecified: Secondary | ICD-10-CM | POA: Diagnosis not present

## 2012-02-08 DIAGNOSIS — D649 Anemia, unspecified: Secondary | ICD-10-CM | POA: Diagnosis not present

## 2012-02-08 DIAGNOSIS — N189 Chronic kidney disease, unspecified: Secondary | ICD-10-CM | POA: Diagnosis not present

## 2012-02-08 DIAGNOSIS — I10 Essential (primary) hypertension: Secondary | ICD-10-CM | POA: Diagnosis not present

## 2012-02-13 DIAGNOSIS — N2581 Secondary hyperparathyroidism of renal origin: Secondary | ICD-10-CM | POA: Diagnosis not present

## 2012-02-13 DIAGNOSIS — D649 Anemia, unspecified: Secondary | ICD-10-CM | POA: Diagnosis not present

## 2012-02-13 DIAGNOSIS — N184 Chronic kidney disease, stage 4 (severe): Secondary | ICD-10-CM | POA: Diagnosis not present

## 2012-02-13 DIAGNOSIS — I10 Essential (primary) hypertension: Secondary | ICD-10-CM | POA: Diagnosis not present

## 2012-02-20 DIAGNOSIS — E1149 Type 2 diabetes mellitus with other diabetic neurological complication: Secondary | ICD-10-CM | POA: Diagnosis not present

## 2012-02-20 DIAGNOSIS — E119 Type 2 diabetes mellitus without complications: Secondary | ICD-10-CM | POA: Diagnosis not present

## 2012-04-23 ENCOUNTER — Encounter (HOSPITAL_COMMUNITY): Payer: Self-pay

## 2012-04-23 ENCOUNTER — Other Ambulatory Visit (HOSPITAL_COMMUNITY): Payer: Self-pay | Admitting: Internal Medicine

## 2012-04-23 ENCOUNTER — Ambulatory Visit (HOSPITAL_COMMUNITY)
Admission: RE | Admit: 2012-04-23 | Discharge: 2012-04-23 | Disposition: A | Discharge status: Home / Self Care | Payer: Medicare Other | Source: Ambulatory Visit | Attending: Internal Medicine | Admitting: Internal Medicine

## 2012-04-23 DIAGNOSIS — R0602 Shortness of breath: Secondary | ICD-10-CM

## 2012-04-23 DIAGNOSIS — J90 Pleural effusion, not elsewhere classified: Secondary | ICD-10-CM | POA: Insufficient documentation

## 2012-04-23 DIAGNOSIS — I509 Heart failure, unspecified: Secondary | ICD-10-CM

## 2012-04-23 DIAGNOSIS — R918 Other nonspecific abnormal finding of lung field: Secondary | ICD-10-CM | POA: Insufficient documentation

## 2012-04-25 ENCOUNTER — Encounter (HOSPITAL_COMMUNITY): Payer: Self-pay

## 2012-04-25 ENCOUNTER — Ambulatory Visit (HOSPITAL_COMMUNITY)
Admission: RE | Admit: 2012-04-25 | Discharge: 2012-04-25 | Disposition: A | Discharge status: Home / Self Care | Payer: Medicare Other | Source: Ambulatory Visit | Attending: Internal Medicine | Admitting: Internal Medicine

## 2012-04-25 DIAGNOSIS — R0602 Shortness of breath: Secondary | ICD-10-CM | POA: Insufficient documentation

## 2012-04-25 DIAGNOSIS — I509 Heart failure, unspecified: Secondary | ICD-10-CM | POA: Insufficient documentation

## 2012-04-25 DIAGNOSIS — E119 Type 2 diabetes mellitus without complications: Secondary | ICD-10-CM | POA: Insufficient documentation

## 2012-04-25 DIAGNOSIS — I10 Essential (primary) hypertension: Secondary | ICD-10-CM | POA: Insufficient documentation

## 2012-05-14 ENCOUNTER — Other Ambulatory Visit (HOSPITAL_COMMUNITY): Payer: Self-pay | Admitting: Internal Medicine

## 2012-05-14 ENCOUNTER — Ambulatory Visit (HOSPITAL_COMMUNITY)
Admission: RE | Admit: 2012-05-14 | Discharge: 2012-05-14 | Disposition: A | Discharge status: Home / Self Care | Payer: Medicare Other | Source: Ambulatory Visit | Attending: Internal Medicine | Admitting: Internal Medicine

## 2012-05-14 DIAGNOSIS — J40 Bronchitis, not specified as acute or chronic: Secondary | ICD-10-CM | POA: Insufficient documentation

## 2012-05-14 DIAGNOSIS — Z09 Encounter for follow-up examination after completed treatment for conditions other than malignant neoplasm: Secondary | ICD-10-CM | POA: Insufficient documentation

## 2012-11-20 ENCOUNTER — Other Ambulatory Visit: Payer: Self-pay | Admitting: *Deleted

## 2012-11-20 DIAGNOSIS — Z01810 Encounter for preprocedural cardiovascular examination: Secondary | ICD-10-CM

## 2012-11-20 DIAGNOSIS — N184 Chronic kidney disease, stage 4 (severe): Secondary | ICD-10-CM

## 2012-11-28 ENCOUNTER — Encounter: Payer: Self-pay | Admitting: Vascular Surgery

## 2012-12-10 ENCOUNTER — Encounter: Payer: Self-pay | Admitting: Vascular Surgery

## 2012-12-11 ENCOUNTER — Encounter: Payer: Self-pay | Admitting: Vascular Surgery

## 2012-12-11 ENCOUNTER — Ambulatory Visit (INDEPENDENT_AMBULATORY_CARE_PROVIDER_SITE_OTHER): Payer: Medicare Other | Admitting: Vascular Surgery

## 2012-12-11 ENCOUNTER — Other Ambulatory Visit: Payer: Self-pay

## 2012-12-11 ENCOUNTER — Encounter (INDEPENDENT_AMBULATORY_CARE_PROVIDER_SITE_OTHER): Payer: Medicare Other | Admitting: *Deleted

## 2012-12-11 VITALS — BP 163/75 | HR 73 | Resp 18 | Ht 73.0 in | Wt 182.3 lb

## 2012-12-11 DIAGNOSIS — N184 Chronic kidney disease, stage 4 (severe): Secondary | ICD-10-CM

## 2012-12-11 DIAGNOSIS — Z01810 Encounter for preprocedural cardiovascular examination: Secondary | ICD-10-CM

## 2012-12-11 NOTE — Progress Notes (Signed)
[facility] [place]  Patient name: Wayne [name] MRN: [id number] DOB: [birth year] Sex: male  REASON FOR CONSULT: evaluate for hemodialysis access. Referred by Dr. Kristian Covey  HPI: Wayne [name] is a 62 y.o. 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 He was referred for evaluation for hemodialysis access. He is right-handed. He has not had any recent uremic symptoms. He denies nausea, vomiting, fatigue, anorexia, or palpitations.  Past Medical History  Diagnosis Date  . Diabetes mellitus without complication   . Hypertension   . CHF (congestive heart failure)   . Chronic kidney disease   . Anemia   . Peripheral vascular disease   . Sarcoidosis     History reviewed. No pertinent family history.   SOCIAL HISTORY: History  Substance Use Topics  . Smoking status: Former Smoker    Quit date: 04/17/1998  . Smokeless tobacco: Not on file  . Alcohol Use: No    No Known Allergies  Current Outpatient Prescriptions  Medication Sig Dispense Refill  . aspirin 81 MG tablet Take 81 mg by mouth daily.      . calcitRIOL (ROCALTROL) 0.25 MCG capsule Take 0.25 mcg by mouth daily.      . insulin glargine (LANTUS) 100 UNIT/ML injection Inject 100 Units into the skin at bedtime.      Marland Kitchen NIFEdipine (PROCARDIA-XL/ADALAT CC) 60 MG 24 hr tablet Take 60 mg by mouth daily.      . sitaGLIPtin (JANUVIA) 100 MG tablet Take 100 mg by mouth daily.      Marland Kitchen atorvastatin (LIPITOR) 40 MG tablet Take 40 mg by mouth daily.      . ferrous sulfate 325 (65 FE) MG tablet Take 325 mg by mouth daily with breakfast.       No current facility-administered medications for this visit.    REVIEW OF SYSTEMS: Arly.Keller ] denotes positive finding; [  ] denotes negative finding  CARDIOVASCULAR:  [ ]  chest pain   [ ]  chest pressure   [ ]  palpitations   [ ]  orthopnea   [ ]  dyspnea on exertion   [ ]  claudication   [ ]  rest pain   [ ]  DVT   [ ]  phlebitis PULMONARY:   [ ]  productive cough   [ ]   asthma   [ ]  wheezing NEUROLOGIC:   [ ]  weakness  [ ]  paresthesias  [ ]  aphasia  [ ]  amaurosis  [ ]  dizziness HEMATOLOGIC:   [ ]  bleeding problems   [ ]  clotting disorders MUSCULOSKELETAL:  [ ]  joint pain   [ ]  joint swelling [ ]  leg swelling GASTROINTESTINAL: [ ]   blood in stool  [ ]   hematemesis GENITOURINARY:  [ ]   dysuria  [ ]   hematuria PSYCHIATRIC:  [ ]  history of major depression INTEGUMENTARY:  [ ]  rashes  [ ]  ulcers CONSTITUTIONAL:  [ ]  fever   [ ]  chills  PHYSICAL EXAM: Filed Vitals:   12/11/12 1411  BP: 163/75  Pulse: 73  Resp: 18  Height: 6\' 1"  (1.854 m)  Weight: 182 lb 4.8 oz (82.691 kg)   Body mass index is 24.06 kg/(m^2). GENERAL: The patient is a well-nourished male, in no acute distress. The vital signs are documented above. CARDIOVASCULAR: There is a regular rate and rhythm. I do not detect carotid bruits. He has palpable radial pulses. PULMONARY: There is good air exchange bilaterally without wheezing or rales. ABDOMEN: Soft and non-tender with normal pitched bowel sounds.  MUSCULOSKELETAL: There  are no major deformities or cyanosis. NEUROLOGIC: No focal weakness or paresthesias are detected. SKIN: There are no ulcers or rashes noted. PSYCHIATRIC: The patient has a normal affect.  DATA:  I have independently interpreted his vein map. Forearm cephalic vein on the left is somewhat marginal in size the upper cephalic vein looks more reasonable in size. The basilic vein appears to be good size on the left although it empties into the brachial system early. On the right arm the forearm and upper arm cephalic vein is marginal in size. The basilic vein appears to be reasonable in size although it empties into the deep brachial system early.  MEDICAL ISSUES: I've recommended that we place an AV fistula in the left arm. Most likely this would be a brachiocephalic fistula.I have explained the indications for placement of an AV fistula or AV graft. I've explained that if at  all possible we will place an AV fistula.  I have reviewed the risks of placement of an AV fistula including but not limited to: failure of the fistula to mature, need for subsequent interventions, and thrombosis. In addition I have reviewed the potential complications of placement of an AV graft. These risks include, but are not limited to, graft thrombosis, graft infection, wound healing problems, bleeding, arm swelling, and steal syndrome. All the patient's questions were answered and they are agreeable to proceed with surgery. He would like to schedule his surgery on 12/31/2012.  Tamari Redwine S [facility] [place] Beeper: [phone]

## 2012-12-19 ENCOUNTER — Encounter (HOSPITAL_COMMUNITY): Payer: Self-pay | Admitting: Pharmacy Technician

## 2012-12-26 ENCOUNTER — Encounter (HOSPITAL_COMMUNITY)
Admission: RE | Admit: 2012-12-26 | Discharge: 2012-12-26 | Disposition: A | Discharge status: Home / Self Care | Payer: Medicare Other | Source: Ambulatory Visit | Attending: Vascular Surgery | Admitting: Vascular Surgery

## 2012-12-26 ENCOUNTER — Encounter (HOSPITAL_COMMUNITY): Payer: Self-pay

## 2012-12-26 DIAGNOSIS — Z01818 Encounter for other preprocedural examination: Secondary | ICD-10-CM | POA: Insufficient documentation

## 2012-12-26 DIAGNOSIS — Z01812 Encounter for preprocedural laboratory examination: Secondary | ICD-10-CM | POA: Insufficient documentation

## 2012-12-26 DIAGNOSIS — Z01810 Encounter for preprocedural cardiovascular examination: Secondary | ICD-10-CM | POA: Insufficient documentation

## 2012-12-26 LAB — POCT I-STAT 4, (NA,K, GLUC, HGB,HCT)
Hemoglobin: 11.2 g/dL — ABNORMAL LOW (ref 13.0–17.0)
Sodium: 141 mEq/L (ref 135–145)

## 2012-12-26 NOTE — Pre-Procedure Instructions (Signed)
Wayne [name]  12/26/2012   Your procedure is scheduled on:  Tuesday   12/31/12   Report to [facility] at 530  AM.  Call this number if you have problems the morning of surgery: [phone]   Remember:   Do not eat food or drink liquids after midnight.   Take these medicines the morning of surgery with A SIP OF WATER:  Nifedipine(procardia)   Do not wear jewelry, make-up or nail polish.  Do not wear lotions, powders, or perfumes. You may wear deodorant.  Do not shave 48 hours prior to surgery. Men may shave face and neck.  Do not bring valuables to the hospital.  [facility] is not responsible                   for any belongings or valuables.  Contacts, dentures or bridgework may not be worn into surgery.  Leave suitcase in the car. After surgery it may be brought to your room.  For patients admitted to the hospital, checkout time is 11:00 AM the day of  discharge.   Patients discharged the day of surgery will not be allowed to drive  home.  Name and phone number of your driver:  Special Instructions: Shower using CHG 2 nights before surgery and the night before surgery.  If you shower the day of surgery use CHG.  Use special wash - you have one bottle of CHG for all showers.  You should use approximately 1/3 of the bottle for each shower.   Please read over the following fact sheets that you were given: Pain Booklet, Coughing and Deep Breathing, Blood Transfusion Information and Surgical Site Infection Prevention

## 2012-12-30 MED ORDER — DEXTROSE 5 % IV SOLN
1.5000 g | INTRAVENOUS | Status: AC
  Administered 2012-12-31: 1.5 g via INTRAVENOUS
  Filled 2012-12-30: qty 1.5

## 2012-12-31 ENCOUNTER — Ambulatory Visit (HOSPITAL_COMMUNITY): Payer: Medicare Other | Admitting: Anesthesiology

## 2012-12-31 ENCOUNTER — Telehealth: Payer: Self-pay | Admitting: Vascular Surgery

## 2012-12-31 ENCOUNTER — Encounter (HOSPITAL_COMMUNITY): Payer: Self-pay | Admitting: Anesthesiology

## 2012-12-31 ENCOUNTER — Other Ambulatory Visit: Payer: Self-pay | Admitting: *Deleted

## 2012-12-31 ENCOUNTER — Ambulatory Visit (HOSPITAL_COMMUNITY)
Admission: RE | Admit: 2012-12-31 | Discharge: 2012-12-31 | Disposition: A | Discharge status: Home / Self Care | Payer: Medicare Other | Source: Ambulatory Visit | Attending: Vascular Surgery | Admitting: Vascular Surgery

## 2012-12-31 ENCOUNTER — Encounter (HOSPITAL_COMMUNITY)
Admission: RE | Disposition: A | Discharge status: Home / Self Care | Payer: Self-pay | Source: Ambulatory Visit | Attending: Vascular Surgery

## 2012-12-31 ENCOUNTER — Encounter (HOSPITAL_COMMUNITY): Payer: Self-pay | Admitting: *Deleted

## 2012-12-31 DIAGNOSIS — D869 Sarcoidosis, unspecified: Secondary | ICD-10-CM | POA: Insufficient documentation

## 2012-12-31 DIAGNOSIS — Z4931 Encounter for adequacy testing for hemodialysis: Secondary | ICD-10-CM

## 2012-12-31 DIAGNOSIS — N186 End stage renal disease: Secondary | ICD-10-CM

## 2012-12-31 DIAGNOSIS — I129 Hypertensive chronic kidney disease with stage 1 through stage 4 chronic kidney disease, or unspecified chronic kidney disease: Secondary | ICD-10-CM | POA: Insufficient documentation

## 2012-12-31 DIAGNOSIS — I509 Heart failure, unspecified: Secondary | ICD-10-CM | POA: Insufficient documentation

## 2012-12-31 DIAGNOSIS — N184 Chronic kidney disease, stage 4 (severe): Secondary | ICD-10-CM | POA: Insufficient documentation

## 2012-12-31 DIAGNOSIS — E119 Type 2 diabetes mellitus without complications: Secondary | ICD-10-CM | POA: Insufficient documentation

## 2012-12-31 DIAGNOSIS — Z794 Long term (current) use of insulin: Secondary | ICD-10-CM | POA: Insufficient documentation

## 2012-12-31 HISTORY — PX: AV FISTULA PLACEMENT: SHX1204

## 2012-12-31 SURGERY — ARTERIOVENOUS (AV) FISTULA CREATION
Anesthesia: Monitor Anesthesia Care | Site: Arm Lower | Laterality: Left | Wound class: Clean

## 2012-12-31 MED ORDER — HYDROMORPHONE HCL PF 1 MG/ML IJ SOLN: 0.2500 mg | INTRAMUSCULAR | Status: DC | PRN

## 2012-12-31 MED ORDER — SODIUM CHLORIDE 0.9 % IV SOLN
INTRAVENOUS | Status: DC
Start: 2012-12-31 — End: 2012-12-31

## 2012-12-31 MED ORDER — LIDOCAINE HCL (PF) 1 % IJ SOLN
INTRAMUSCULAR | Status: DC | PRN
  Administered 2012-12-31: 30 mL via INTRADERMAL
  Administered 2012-12-31: 15 mL via INTRADERMAL

## 2012-12-31 MED ORDER — LIDOCAINE HCL (PF) 1 % IJ SOLN
INTRAMUSCULAR | Status: AC
  Filled 2012-12-31: qty 30

## 2012-12-31 MED ORDER — 0.9 % SODIUM CHLORIDE (POUR BTL) OPTIME
TOPICAL | Status: DC | PRN
  Administered 2012-12-31: 1000 mL

## 2012-12-31 MED ORDER — THROMBIN 20000 UNITS EX SOLR
CUTANEOUS | Status: AC
  Filled 2012-12-31: qty 20000

## 2012-12-31 MED ORDER — LIDOCAINE HCL (CARDIAC) 20 MG/ML IV SOLN
INTRAVENOUS | Status: DC | PRN
  Administered 2012-12-31: 40 mg via INTRAVENOUS

## 2012-12-31 MED ORDER — PROPOFOL 10 MG/ML IV BOLUS
INTRAVENOUS | Status: DC | PRN
  Administered 2012-12-31: 40 mg via INTRAVENOUS

## 2012-12-31 MED ORDER — SODIUM CHLORIDE 0.9 % IV SOLN
INTRAVENOUS | Status: DC | PRN
  Administered 2012-12-31: 07:00:00 via INTRAVENOUS

## 2012-12-31 MED ORDER — SODIUM CHLORIDE 0.9 % IR SOLN
Status: DC | PRN
  Administered 2012-12-31: 07:00:00

## 2012-12-31 MED ORDER — PROPOFOL INFUSION 10 MG/ML OPTIME
INTRAVENOUS | Status: DC | PRN
  Administered 2012-12-31: 75 ug/kg/min via INTRAVENOUS

## 2012-12-31 MED ORDER — PROTAMINE SULFATE 10 MG/ML IV SOLN
INTRAVENOUS | Status: DC | PRN
  Administered 2012-12-31 (×3): 10 mg via INTRAVENOUS

## 2012-12-31 MED ORDER — HEPARIN SODIUM (PORCINE) 1000 UNIT/ML IJ SOLN
INTRAMUSCULAR | Status: DC | PRN
  Administered 2012-12-31: 6000 [IU] via INTRAVENOUS

## 2012-12-31 MED ORDER — ONDANSETRON HCL 4 MG/2ML IJ SOLN: 4.0000 mg | Freq: Once | INTRAMUSCULAR | Status: DC | PRN

## 2012-12-31 MED ORDER — OXYCODONE HCL 5 MG PO TABS: 5.0000 mg | ORAL_TABLET | Freq: Four times a day (QID) | ORAL | Status: DC | PRN

## 2012-12-31 MED ORDER — FENTANYL CITRATE 0.05 MG/ML IJ SOLN
INTRAMUSCULAR | Status: DC | PRN
  Administered 2012-12-31 (×2): 50 ug via INTRAVENOUS

## 2012-12-31 SURGICAL SUPPLY — 41 items
ADH SKN CLS APL DERMABOND .7 (GAUZE/BANDAGES/DRESSINGS) ×1
ARMBAND PINK RESTRICT EXTREMIT (MISCELLANEOUS) ×4 IMPLANT
CANISTER SUCTION 2500CC (MISCELLANEOUS) ×2 IMPLANT
CLIP TI MEDIUM 6 (CLIP) ×2 IMPLANT
CLIP TI WIDE RED SMALL 6 (CLIP) ×4 IMPLANT
COVER PROBE W GEL 5X96 (DRAPES) IMPLANT
COVER SURGICAL LIGHT HANDLE (MISCELLANEOUS) ×2 IMPLANT
DECANTER SPIKE VIAL GLASS SM (MISCELLANEOUS) ×2 IMPLANT
DERMABOND ADVANCED (GAUZE/BANDAGES/DRESSINGS) ×1
DERMABOND ADVANCED .7 DNX12 (GAUZE/BANDAGES/DRESSINGS) ×1 IMPLANT
DRAIN PENROSE 1/2X12 LTX STRL (WOUND CARE) IMPLANT
ELECT CAUTERY BLADE 6.4 (BLADE) ×1 IMPLANT
ELECT REM PT RETURN 9FT ADLT (ELECTROSURGICAL) ×2
ELECTRODE REM PT RTRN 9FT ADLT (ELECTROSURGICAL) ×1 IMPLANT
GEL ULTRASOUND 8.5O AQUASONIC (MISCELLANEOUS) ×1 IMPLANT
GLOVE BIO SURGEON STRL SZ 6.5 (GLOVE) ×2 IMPLANT
GLOVE BIO SURGEON STRL SZ7.5 (GLOVE) ×2 IMPLANT
GLOVE BIOGEL PI IND STRL 8 (GLOVE) ×1 IMPLANT
GLOVE BIOGEL PI INDICATOR 8 (GLOVE) ×1
GLOVE SURG SS PI 7.0 STRL IVOR (GLOVE) ×1 IMPLANT
GOWN STRL NON-REIN LRG LVL3 (GOWN DISPOSABLE) ×6 IMPLANT
KIT BASIN OR (CUSTOM PROCEDURE TRAY) ×2 IMPLANT
KIT ROOM TURNOVER OR (KITS) ×2 IMPLANT
LOOP VESSEL MAXI BLUE (MISCELLANEOUS) ×1 IMPLANT
LOOP VESSEL MINI RED (MISCELLANEOUS) ×1 IMPLANT
NDL HYPO 25GX1X1/2 BEV (NEEDLE) ×1 IMPLANT
NEEDLE HYPO 25GX1X1/2 BEV (NEEDLE) ×2 IMPLANT
NS IRRIG 1000ML POUR BTL (IV SOLUTION) ×2 IMPLANT
PACK CV ACCESS (CUSTOM PROCEDURE TRAY) ×2 IMPLANT
PAD ARMBOARD 7.5X6 YLW CONV (MISCELLANEOUS) ×4 IMPLANT
PENCIL BUTTON HOLSTER BLD 10FT (ELECTRODE) ×1 IMPLANT
SPONGE GAUZE 4X4 12PLY (GAUZE/BANDAGES/DRESSINGS) ×2 IMPLANT
SPONGE SURGIFOAM ABS GEL 100 (HEMOSTASIS) IMPLANT
SUT PROLENE 6 0 BV (SUTURE) ×3 IMPLANT
SUT VIC AB 3-0 SH 27 (SUTURE) ×6
SUT VIC AB 3-0 SH 27X BRD (SUTURE) ×1 IMPLANT
SUT VICRYL 4-0 PS2 18IN ABS (SUTURE) ×3 IMPLANT
TOWEL OR 17X24 6PK STRL BLUE (TOWEL DISPOSABLE) ×2 IMPLANT
TOWEL OR 17X26 10 PK STRL BLUE (TOWEL DISPOSABLE) ×2 IMPLANT
UNDERPAD 30X30 INCONTINENT (UNDERPADS AND DIAPERS) ×2 IMPLANT
WATER STERILE IRR 1000ML POUR (IV SOLUTION) ×2 IMPLANT

## 2012-12-31 NOTE — Progress Notes (Signed)
Report given to lauren reynolds rn as caregiver 

## 2012-12-31 NOTE — Anesthesia Procedure Notes (Signed)
Procedure Name: MAC Date/Time: 12/31/2012 7:35 AM Performed by: De Nurse Pre-anesthesia Checklist: Patient identified, Emergency Drugs available, Suction available and Patient being monitored Patient Re-evaluated:Patient Re-evaluated prior to inductionOxygen Delivery Method: Simple face mask Intubation Type: IV induction

## 2012-12-31 NOTE — Op Note (Signed)
NAME: Wayne [name]    MRN: [id number] DOB: [birth year]    DATE OF OPERATION: 12/31/2012  PREOP DIAGNOSIS: stage IV chronic kidney disease  POSTOP DIAGNOSIS: same  PROCEDURE: left brachiocephalic AV fistula  SURGEON: Di Kindle. Edilia Bo, MD, FACS  ASSIST: Doreatha Massed, PA   ANESTHESIA: local with sedation   EBL: minimal  INDICATIONS: Wayne [name] is a 62 y.o. male who is not yet on dialysis. He presents for new access.  FINDINGS: the cephalic vein was marginal. However the basilic vein was very small and also emptied into the brachial system early. The cephalic vein appeared to be beneath the fascia and therefore I made 2 incisions to free the vein out from the fascia. The artery was 4.5 mm.  TECHNIQUE: The patient was taken to the operating room and sedated by anesthesia. Left upper extremity was prepped and draped in usual sterile fashion. After the skin was anesthetized with 1% lidocaine, a transverse incision was made just above the antecubital level. Here the cephalic vein was dissected free. The vein was lateral and therefore in order to have adequate length for an anastomosis to the brachial artery, I did make a separate incision in the upper forearm longitudinally over the vein. The vein here which clipped distally and then irrigated out with saline. The brachial artery was dissected free beneath the fascia. It did have some calcium with but was a good sized artery. The basilic vein was very small. I divided the cephalic vein at the level of the basilic vein takeoff and then connected to an hour for a more widely spatulated arterial anastomosis. The patient was heparinized. The brachial artery was clamped proximally and distally and a longitudinal arteriotomy was made. The vein was sewn end-to-side to the artery using continuous 6-0 Prolene suture. At the completion was a good thrill in the fistula and a radial and ulnar signal with the Doppler. Using 2 small incisions, after the skin  was anesthetized, I didn't free up the vein from the overlying fascia over the majority of the upper arm. 2 small competing branches were also ligated. The heparin was partially reversed with protamine. Each of the layers was closed the deep layer 3-0 Vicryl and the skin closed with 4-0 Vicryl. Dermabond was applied. The patient tolerated the procedure well and was transferred to the recovery room in stable condition. All needle and sponge counts were correct.  [name], MD, FACS [facility]  DATE OF DICTATION:   12/31/2012

## 2012-12-31 NOTE — Anesthesia Postprocedure Evaluation (Signed)
  Anesthesia Post-op Note  Patient: Wayne [name]  Procedure(s) Performed: Procedure(s): ARTERIOVENOUS (AV) FISTULA CREATION-LEFT (Left)  Patient Location: PACU  Anesthesia Type:MAC  Level of Consciousness: awake, alert , oriented and patient cooperative  Airway and Oxygen Therapy: Patient Spontanous Breathing  Post-op Pain: mild  Post-op Assessment: Post-op Vital signs reviewed, Patient's Cardiovascular Status Stable, Respiratory Function Stable, Patent Airway, No signs of Nausea or vomiting and Pain level controlled  Post-op Vital Signs: stable  Complications: No apparent anesthesia complications

## 2012-12-31 NOTE — Preoperative (Signed)
Beta Blockers   Reason not to administer Beta Blockers:Not Applicable 

## 2012-12-31 NOTE — Interval H&P Note (Signed)
History and Physical Interval Note:  12/31/2012 7:22 AM  Wayne [name]  has presented today for surgery, with the diagnosis of End Stage Renal Disease  The various methods of treatment have been discussed with the patient and family. After consideration of risks, benefits and other options for treatment, the patient has consented to  Procedure(s): ARTERIOVENOUS (AV) FISTULA CREATION-LEFT (Left) as a surgical intervention .  The patient's history has been reviewed, patient examined, no change in status, stable for surgery.  I have reviewed the patient's chart and labs.  Questions were answered to the patient's satisfaction.     [name] S

## 2012-12-31 NOTE — Telephone Encounter (Signed)
Message copied by Jena Gauss on Tue Dec 31, 2012 12:00 PM ------      Message from: Sharee Pimple      Created: Tue Dec 31, 2012  9:53 AM      Regarding: schedule                   ----- Message -----         From: Dara Lords, PA-C         Sent: 12/31/2012   9:20 AM           To: Sharee Pimple, CMA            S/p left BC AVF 12/31/12.  F/u with Dr. Edilia Bo in 6 weeks with duplex of AVF.              Thanks,      Lelon Mast ------

## 2012-12-31 NOTE — Anesthesia Preprocedure Evaluation (Addendum)
Anesthesia Evaluation  Patient identified by MRN, date of birth, ID band Patient awake    Reviewed: Allergy & Precautions, H&P , NPO status   Airway       Dental  (+) Missing, Partial Upper and Dental Advisory Given   Pulmonary          Cardiovascular hypertension, + Peripheral Vascular Disease and +CHF Rhythm:Regular Rate:Normal     Neuro/Psych    GI/Hepatic   Endo/Other  diabetes, Type 2, Insulin Dependent  Renal/GU Dialysis and ESRFRenal disease     Musculoskeletal   Abdominal   Peds  Hematology   Anesthesia Other Findings   Reproductive/Obstetrics                          Anesthesia Physical Anesthesia Plan  ASA: III  Anesthesia Plan: MAC and General   Post-op Pain Management:    Induction: Intravenous  Airway Management Planned: Mask and LMA  Additional Equipment:   Intra-op Plan:   Post-operative Plan: Extubation in OR  Informed Consent: I have reviewed the patients History and Physical, chart, labs and discussed the procedure including the risks, benefits and alternatives for the proposed anesthesia with the patient or authorized representative who has indicated his/her understanding and acceptance.     Plan Discussed with: CRNA, Anesthesiologist and Surgeon  Anesthesia Plan Comments:         Anesthesia Quick Evaluation

## 2012-12-31 NOTE — H&P (View-Only) (Signed)
[facility] [place]  Patient name: Wayne [name] MRN: [id number] DOB: 06/10/1950 Sex: male  REASON FOR CONSULT: evaluate for hemodialysis access. Referred by Dr. Befekadu  HPI: Wayne [name] is a 62 y.o. 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 He was referred for evaluation for hemodialysis access. He is right-handed. He has not had any recent uremic symptoms. He denies nausea, vomiting, fatigue, anorexia, or palpitations.  Past Medical History  Diagnosis Date  . Diabetes mellitus without complication   . Hypertension   . CHF (congestive heart failure)   . Chronic kidney disease   . Anemia   . Peripheral vascular disease   . Sarcoidosis     History reviewed. No pertinent family history.   SOCIAL HISTORY: History  Substance Use Topics  . Smoking status: Former Smoker    Quit date: 04/17/1998  . Smokeless tobacco: Not on file  . Alcohol Use: No    No Known Allergies  Current Outpatient Prescriptions  Medication Sig Dispense Refill  . aspirin 81 MG tablet Take 81 mg by mouth daily.      . calcitRIOL (ROCALTROL) 0.25 MCG capsule Take 0.25 mcg by mouth daily.      . insulin glargine (LANTUS) 100 UNIT/ML injection Inject 100 Units into the skin at bedtime.      . NIFEdipine (PROCARDIA-XL/ADALAT CC) 60 MG 24 hr tablet Take 60 mg by mouth daily.      . sitaGLIPtin (JANUVIA) 100 MG tablet Take 100 mg by mouth daily.      . atorvastatin (LIPITOR) 40 MG tablet Take 40 mg by mouth daily.      . ferrous sulfate 325 (65 FE) MG tablet Take 325 mg by mouth daily with breakfast.       No current facility-administered medications for this visit.    REVIEW OF SYSTEMS: [X ] denotes positive finding; [  ] denotes negative finding  CARDIOVASCULAR:  [ ] chest pain   [ ] chest pressure   [ ] palpitations   [ ] orthopnea   [ ] dyspnea on exertion   [ ] claudication   [ ] rest pain   [ ] DVT   [ ] phlebitis PULMONARY:   [ ] productive cough   [ ]  asthma   [ ] wheezing NEUROLOGIC:   [ ] weakness  [ ] paresthesias  [ ] aphasia  [ ] amaurosis  [ ] dizziness HEMATOLOGIC:   [ ] bleeding problems   [ ] clotting disorders MUSCULOSKELETAL:  [ ] joint pain   [ ] joint swelling [ ] leg swelling GASTROINTESTINAL: [ ]  blood in stool  [ ]  hematemesis GENITOURINARY:  [ ]  dysuria  [ ]  hematuria PSYCHIATRIC:  [ ] history of major depression INTEGUMENTARY:  [ ] rashes  [ ] ulcers CONSTITUTIONAL:  [ ] fever   [ ] chills  PHYSICAL EXAM: Filed Vitals:   12/11/12 1411  BP: 163/75  Pulse: 73  Resp: 18  Height: 6' 1" (1.854 m)  Weight: 182 lb 4.8 oz (82.691 kg)   Body mass index is 24.06 kg/(m^2). GENERAL: The patient is a well-nourished male, in no acute distress. The vital signs are documented above. CARDIOVASCULAR: There is a regular rate and rhythm. I do not detect carotid bruits. He has palpable radial pulses. PULMONARY: There is good air exchange bilaterally without wheezing or rales. ABDOMEN: Soft and non-tender with normal pitched bowel sounds.  MUSCULOSKELETAL: There   are no major deformities or cyanosis. NEUROLOGIC: No focal weakness or paresthesias are detected. SKIN: There are no ulcers or rashes noted. PSYCHIATRIC: The patient has a normal affect.  DATA:  I have independently interpreted his vein map. Forearm cephalic vein on the left is somewhat marginal in size the upper cephalic vein looks more reasonable in size. The basilic vein appears to be good size on the left although it empties into the brachial system early. On the right arm the forearm and upper arm cephalic vein is marginal in size. The basilic vein appears to be reasonable in size although it empties into the deep brachial system early.  MEDICAL ISSUES: I've recommended that we place an AV fistula in the left arm. Most likely this would be a brachiocephalic fistula.I have explained the indications for placement of an AV fistula or AV graft. I've explained that if at  all possible we will place an AV fistula.  I have reviewed the risks of placement of an AV fistula including but not limited to: failure of the fistula to mature, need for subsequent interventions, and thrombosis. In addition I have reviewed the potential complications of placement of an AV graft. These risks include, but are not limited to, graft thrombosis, graft infection, wound healing problems, bleeding, arm swelling, and steal syndrome. All the patient's questions were answered and they are agreeable to proceed with surgery. He would like to schedule his surgery on 12/31/2012.  Johnte Portnoy S [facility] [place] [facility]: [phone]    

## 2012-12-31 NOTE — Transfer of Care (Signed)
Immediate Anesthesia Transfer of Care Note  Patient: Wayne [name]  Procedure(s) Performed: Procedure(s): ARTERIOVENOUS (AV) FISTULA CREATION-LEFT (Left)  Patient Location: PACU  Anesthesia Type:General  Level of Consciousness: awake, alert  and oriented  Airway & Oxygen Therapy: Patient Spontanous Breathing and Patient connected to nasal cannula oxygen  Post-op Assessment: Report given to PACU RN  Post vital signs: Reviewed and stable  Complications: No apparent anesthesia complications

## 2013-01-02 ENCOUNTER — Encounter (HOSPITAL_COMMUNITY): Payer: Self-pay | Admitting: Vascular Surgery

## 2013-02-11 ENCOUNTER — Encounter: Payer: Self-pay | Admitting: Vascular Surgery

## 2013-02-12 ENCOUNTER — Ambulatory Visit (HOSPITAL_COMMUNITY)
Admission: RE | Admit: 2013-02-12 | Discharge: 2013-02-12 | Disposition: A | Discharge status: Home / Self Care | Payer: Medicare Other | Source: Ambulatory Visit | Attending: Vascular Surgery | Admitting: Vascular Surgery

## 2013-02-12 ENCOUNTER — Ambulatory Visit (INDEPENDENT_AMBULATORY_CARE_PROVIDER_SITE_OTHER): Payer: Self-pay | Admitting: Vascular Surgery

## 2013-02-12 ENCOUNTER — Encounter: Payer: Self-pay | Admitting: Vascular Surgery

## 2013-02-12 VITALS — BP 159/83 | HR 88 | Ht 73.0 in | Wt 179.3 lb

## 2013-02-12 DIAGNOSIS — N186 End stage renal disease: Secondary | ICD-10-CM | POA: Insufficient documentation

## 2013-02-12 DIAGNOSIS — Z4931 Encounter for adequacy testing for hemodialysis: Secondary | ICD-10-CM | POA: Insufficient documentation

## 2013-02-12 DIAGNOSIS — N184 Chronic kidney disease, stage 4 (severe): Secondary | ICD-10-CM

## 2013-02-12 NOTE — Assessment & Plan Note (Signed)
His left brachiocephalic fistula appears to be maturing adequately. I think he should be ready to use for dialysis in approximately 2 months if needed. We will be happy to see him back if any new problems arise or there are any concerns with his fistula.

## 2013-02-12 NOTE — Progress Notes (Signed)
Patient name: Wayne [name] MRN: [id number] DOB: 23-Feb-1951 Sex: male  REASON FOR VISIT: follow up after left brachiocephalic AV fistula  HPI: Wayne [name] is a 62 y.o. male who is not yet on dialysis. He had a left brachiocephalic AV fistula placed on 56/21/3086. He comes in for a routine follow up visit. He has no specific complaints. He has no pain or paresthesias in his left arm.  REVIEW OF SYSTEMS: Arly.Keller ] denotes positive finding; [  ] denotes negative finding  CARDIOVASCULAR:  [ ]  chest pain   [ ]  dyspnea on exertion    CONSTITUTIONAL:  [ ]  fever   [ ]  chills  PHYSICAL EXAM: Filed Vitals:   02/12/13 1318  BP: 159/83  Pulse: 88  Height: 6\' 1"  (1.854 m)  Weight: 179 lb 4.8 oz (81.33 kg)  SpO2: 100%   Body mass index is 23.66 kg/(m^2). GENERAL: The patient is a well-nourished male, in no acute distress. The vital signs are documented above. CARDIOVASCULAR: There is a regular rate and rhythm  PULMONARY: There is good air exchange bilaterally without wheezing or rales. His left upper arm fistula has an excellent bruit and thrill. He has a palpable left radial pulse.  I have reviewed his duplex scan which shows his fistula is patent with no significant large competing branches. There are some elevated velocities at the proximal fistula however by exam the fistula has an excellent thrill and therefore this does not appear to be significant.  MEDICAL ISSUES:  Chronic kidney disease, stage IV (severe) His left brachiocephalic fistula appears to be maturing adequately. I think he should be ready to use for dialysis in approximately 2 months if needed. We will be happy to see him back if any new problems arise or there are any concerns with his fistula.   DICKSON,CHRISTOPHER S [facility] [place] [facility]: [phone]

## 2013-09-09 DIAGNOSIS — I10 Essential (primary) hypertension: Secondary | ICD-10-CM | POA: Diagnosis not present

## 2013-09-09 DIAGNOSIS — N19 Unspecified kidney failure: Secondary | ICD-10-CM | POA: Diagnosis not present

## 2013-09-09 DIAGNOSIS — E119 Type 2 diabetes mellitus without complications: Secondary | ICD-10-CM | POA: Diagnosis not present

## 2013-09-17 DIAGNOSIS — E559 Vitamin D deficiency, unspecified: Secondary | ICD-10-CM | POA: Diagnosis not present

## 2013-09-17 DIAGNOSIS — Z79899 Other long term (current) drug therapy: Secondary | ICD-10-CM | POA: Diagnosis not present

## 2013-09-17 DIAGNOSIS — N189 Chronic kidney disease, unspecified: Secondary | ICD-10-CM | POA: Diagnosis not present

## 2013-09-17 DIAGNOSIS — R809 Proteinuria, unspecified: Secondary | ICD-10-CM | POA: Diagnosis not present

## 2013-09-17 DIAGNOSIS — E1129 Type 2 diabetes mellitus with other diabetic kidney complication: Secondary | ICD-10-CM | POA: Diagnosis not present

## 2013-09-17 DIAGNOSIS — N185 Chronic kidney disease, stage 5: Secondary | ICD-10-CM | POA: Diagnosis not present

## 2013-09-17 DIAGNOSIS — D649 Anemia, unspecified: Secondary | ICD-10-CM | POA: Diagnosis not present

## 2013-09-17 DIAGNOSIS — I10 Essential (primary) hypertension: Secondary | ICD-10-CM | POA: Diagnosis not present

## 2013-09-23 DIAGNOSIS — D649 Anemia, unspecified: Secondary | ICD-10-CM | POA: Diagnosis not present

## 2013-09-23 DIAGNOSIS — N185 Chronic kidney disease, stage 5: Secondary | ICD-10-CM | POA: Diagnosis not present

## 2013-09-23 DIAGNOSIS — R809 Proteinuria, unspecified: Secondary | ICD-10-CM | POA: Diagnosis not present

## 2013-09-23 DIAGNOSIS — I10 Essential (primary) hypertension: Secondary | ICD-10-CM | POA: Diagnosis not present

## 2013-09-23 DIAGNOSIS — I739 Peripheral vascular disease, unspecified: Secondary | ICD-10-CM | POA: Diagnosis not present

## 2013-09-23 DIAGNOSIS — D631 Anemia in chronic kidney disease: Secondary | ICD-10-CM | POA: Diagnosis not present

## 2013-09-30 DIAGNOSIS — E1149 Type 2 diabetes mellitus with other diabetic neurological complication: Secondary | ICD-10-CM | POA: Diagnosis not present

## 2013-09-30 DIAGNOSIS — B351 Tinea unguium: Secondary | ICD-10-CM | POA: Diagnosis not present

## 2013-12-04 DIAGNOSIS — D518 Other vitamin B12 deficiency anemias: Secondary | ICD-10-CM | POA: Diagnosis not present

## 2013-12-04 DIAGNOSIS — N185 Chronic kidney disease, stage 5: Secondary | ICD-10-CM | POA: Diagnosis not present

## 2013-12-04 DIAGNOSIS — E1129 Type 2 diabetes mellitus with other diabetic kidney complication: Secondary | ICD-10-CM | POA: Diagnosis not present

## 2013-12-04 DIAGNOSIS — D649 Anemia, unspecified: Secondary | ICD-10-CM | POA: Diagnosis not present

## 2013-12-04 DIAGNOSIS — I10 Essential (primary) hypertension: Secondary | ICD-10-CM | POA: Diagnosis not present

## 2013-12-04 DIAGNOSIS — D509 Iron deficiency anemia, unspecified: Secondary | ICD-10-CM | POA: Diagnosis not present

## 2013-12-09 DIAGNOSIS — D631 Anemia in chronic kidney disease: Secondary | ICD-10-CM | POA: Diagnosis not present

## 2013-12-09 DIAGNOSIS — D649 Anemia, unspecified: Secondary | ICD-10-CM | POA: Diagnosis not present

## 2013-12-09 DIAGNOSIS — E11319 Type 2 diabetes mellitus with unspecified diabetic retinopathy without macular edema: Secondary | ICD-10-CM | POA: Diagnosis not present

## 2013-12-09 DIAGNOSIS — N185 Chronic kidney disease, stage 5: Secondary | ICD-10-CM | POA: Diagnosis not present

## 2013-12-09 DIAGNOSIS — N19 Unspecified kidney failure: Secondary | ICD-10-CM | POA: Diagnosis not present

## 2013-12-09 DIAGNOSIS — N186 End stage renal disease: Secondary | ICD-10-CM | POA: Diagnosis not present

## 2013-12-09 DIAGNOSIS — R809 Proteinuria, unspecified: Secondary | ICD-10-CM | POA: Diagnosis not present

## 2013-12-09 DIAGNOSIS — I739 Peripheral vascular disease, unspecified: Secondary | ICD-10-CM | POA: Diagnosis not present

## 2013-12-09 DIAGNOSIS — I10 Essential (primary) hypertension: Secondary | ICD-10-CM | POA: Diagnosis not present

## 2013-12-09 DIAGNOSIS — E119 Type 2 diabetes mellitus without complications: Secondary | ICD-10-CM | POA: Diagnosis not present

## 2013-12-09 DIAGNOSIS — E7800 Pure hypercholesterolemia, unspecified: Secondary | ICD-10-CM | POA: Diagnosis not present

## 2013-12-24 DIAGNOSIS — E1139 Type 2 diabetes mellitus with other diabetic ophthalmic complication: Secondary | ICD-10-CM | POA: Diagnosis not present

## 2013-12-24 DIAGNOSIS — H2589 Other age-related cataract: Secondary | ICD-10-CM | POA: Diagnosis not present

## 2013-12-24 DIAGNOSIS — H353190 Nonexudative age-related macular degeneration, unspecified eye, stage unspecified: Secondary | ICD-10-CM | POA: Diagnosis not present

## 2013-12-24 DIAGNOSIS — Z961 Presence of intraocular lens: Secondary | ICD-10-CM | POA: Diagnosis not present

## 2013-12-24 DIAGNOSIS — E11359 Type 2 diabetes mellitus with proliferative diabetic retinopathy without macular edema: Secondary | ICD-10-CM | POA: Diagnosis not present

## 2013-12-24 DIAGNOSIS — H35379 Puckering of macula, unspecified eye: Secondary | ICD-10-CM | POA: Diagnosis not present

## 2014-01-08 DIAGNOSIS — I10 Essential (primary) hypertension: Secondary | ICD-10-CM | POA: Diagnosis not present

## 2014-01-08 DIAGNOSIS — D649 Anemia, unspecified: Secondary | ICD-10-CM | POA: Diagnosis not present

## 2014-01-08 DIAGNOSIS — E559 Vitamin D deficiency, unspecified: Secondary | ICD-10-CM | POA: Diagnosis not present

## 2014-01-08 DIAGNOSIS — N185 Chronic kidney disease, stage 5: Secondary | ICD-10-CM | POA: Diagnosis not present

## 2014-01-08 DIAGNOSIS — Z79899 Other long term (current) drug therapy: Secondary | ICD-10-CM | POA: Diagnosis not present

## 2014-01-08 DIAGNOSIS — E1129 Type 2 diabetes mellitus with other diabetic kidney complication: Secondary | ICD-10-CM | POA: Diagnosis not present

## 2014-01-12 DIAGNOSIS — N185 Chronic kidney disease, stage 5: Secondary | ICD-10-CM | POA: Diagnosis not present

## 2014-01-12 DIAGNOSIS — D649 Anemia, unspecified: Secondary | ICD-10-CM | POA: Diagnosis not present

## 2014-01-12 DIAGNOSIS — I739 Peripheral vascular disease, unspecified: Secondary | ICD-10-CM | POA: Diagnosis not present

## 2014-01-12 DIAGNOSIS — D631 Anemia in chronic kidney disease: Secondary | ICD-10-CM | POA: Diagnosis not present

## 2014-01-12 DIAGNOSIS — I10 Essential (primary) hypertension: Secondary | ICD-10-CM | POA: Diagnosis not present

## 2014-01-12 DIAGNOSIS — R809 Proteinuria, unspecified: Secondary | ICD-10-CM | POA: Diagnosis not present

## 2014-02-20 DIAGNOSIS — D649 Anemia, unspecified: Secondary | ICD-10-CM | POA: Diagnosis not present

## 2014-02-20 DIAGNOSIS — Z79899 Other long term (current) drug therapy: Secondary | ICD-10-CM | POA: Diagnosis not present

## 2014-02-20 DIAGNOSIS — E559 Vitamin D deficiency, unspecified: Secondary | ICD-10-CM | POA: Diagnosis not present

## 2014-02-20 DIAGNOSIS — I10 Essential (primary) hypertension: Secondary | ICD-10-CM | POA: Diagnosis not present

## 2014-02-20 DIAGNOSIS — N183 Chronic kidney disease, stage 3 (moderate): Secondary | ICD-10-CM | POA: Diagnosis not present

## 2014-02-20 DIAGNOSIS — R809 Proteinuria, unspecified: Secondary | ICD-10-CM | POA: Diagnosis not present

## 2014-02-24 DIAGNOSIS — B351 Tinea unguium: Secondary | ICD-10-CM | POA: Diagnosis not present

## 2014-02-24 DIAGNOSIS — E1342 Other specified diabetes mellitus with diabetic polyneuropathy: Secondary | ICD-10-CM | POA: Diagnosis not present

## 2014-02-24 DIAGNOSIS — N185 Chronic kidney disease, stage 5: Secondary | ICD-10-CM | POA: Diagnosis not present

## 2014-02-24 DIAGNOSIS — D869 Sarcoidosis, unspecified: Secondary | ICD-10-CM | POA: Diagnosis not present

## 2014-02-24 DIAGNOSIS — E1121 Type 2 diabetes mellitus with diabetic nephropathy: Secondary | ICD-10-CM | POA: Diagnosis not present

## 2014-02-24 DIAGNOSIS — I10 Essential (primary) hypertension: Secondary | ICD-10-CM | POA: Diagnosis not present

## 2014-02-24 DIAGNOSIS — R809 Proteinuria, unspecified: Secondary | ICD-10-CM | POA: Diagnosis not present

## 2014-02-24 DIAGNOSIS — D631 Anemia in chronic kidney disease: Secondary | ICD-10-CM | POA: Diagnosis not present

## 2014-03-09 DIAGNOSIS — D649 Anemia, unspecified: Secondary | ICD-10-CM | POA: Diagnosis not present

## 2014-03-09 DIAGNOSIS — N186 End stage renal disease: Secondary | ICD-10-CM | POA: Diagnosis not present

## 2014-03-09 DIAGNOSIS — Z79899 Other long term (current) drug therapy: Secondary | ICD-10-CM | POA: Diagnosis not present

## 2014-03-09 DIAGNOSIS — I10 Essential (primary) hypertension: Secondary | ICD-10-CM | POA: Diagnosis not present

## 2014-03-10 DIAGNOSIS — Z89512 Acquired absence of left leg below knee: Secondary | ICD-10-CM | POA: Diagnosis not present

## 2014-03-10 DIAGNOSIS — I10 Essential (primary) hypertension: Secondary | ICD-10-CM | POA: Diagnosis not present

## 2014-03-10 DIAGNOSIS — N186 End stage renal disease: Secondary | ICD-10-CM | POA: Diagnosis not present

## 2014-03-10 DIAGNOSIS — E1165 Type 2 diabetes mellitus with hyperglycemia: Secondary | ICD-10-CM | POA: Diagnosis not present

## 2014-03-10 DIAGNOSIS — Z23 Encounter for immunization: Secondary | ICD-10-CM | POA: Diagnosis not present

## 2014-03-16 DIAGNOSIS — R809 Proteinuria, unspecified: Secondary | ICD-10-CM | POA: Diagnosis not present

## 2014-03-16 DIAGNOSIS — D869 Sarcoidosis, unspecified: Secondary | ICD-10-CM | POA: Diagnosis not present

## 2014-03-16 DIAGNOSIS — D631 Anemia in chronic kidney disease: Secondary | ICD-10-CM | POA: Diagnosis not present

## 2014-03-16 DIAGNOSIS — E1121 Type 2 diabetes mellitus with diabetic nephropathy: Secondary | ICD-10-CM | POA: Diagnosis not present

## 2014-03-16 DIAGNOSIS — I10 Essential (primary) hypertension: Secondary | ICD-10-CM | POA: Diagnosis not present

## 2014-03-16 DIAGNOSIS — Z111 Encounter for screening for respiratory tuberculosis: Secondary | ICD-10-CM | POA: Diagnosis not present

## 2014-03-16 DIAGNOSIS — N185 Chronic kidney disease, stage 5: Secondary | ICD-10-CM | POA: Diagnosis not present

## 2014-03-19 DIAGNOSIS — D631 Anemia in chronic kidney disease: Secondary | ICD-10-CM | POA: Diagnosis not present

## 2014-03-19 DIAGNOSIS — D509 Iron deficiency anemia, unspecified: Secondary | ICD-10-CM | POA: Diagnosis not present

## 2014-03-19 DIAGNOSIS — N2581 Secondary hyperparathyroidism of renal origin: Secondary | ICD-10-CM | POA: Diagnosis not present

## 2014-03-19 DIAGNOSIS — Z992 Dependence on renal dialysis: Secondary | ICD-10-CM | POA: Diagnosis not present

## 2014-03-19 DIAGNOSIS — N186 End stage renal disease: Secondary | ICD-10-CM | POA: Diagnosis not present

## 2014-03-21 DIAGNOSIS — N2581 Secondary hyperparathyroidism of renal origin: Secondary | ICD-10-CM | POA: Diagnosis not present

## 2014-03-21 DIAGNOSIS — Z992 Dependence on renal dialysis: Secondary | ICD-10-CM | POA: Diagnosis not present

## 2014-03-21 DIAGNOSIS — D509 Iron deficiency anemia, unspecified: Secondary | ICD-10-CM | POA: Diagnosis not present

## 2014-03-21 DIAGNOSIS — D631 Anemia in chronic kidney disease: Secondary | ICD-10-CM | POA: Diagnosis not present

## 2014-03-21 DIAGNOSIS — N186 End stage renal disease: Secondary | ICD-10-CM | POA: Diagnosis not present

## 2014-03-24 DIAGNOSIS — N2581 Secondary hyperparathyroidism of renal origin: Secondary | ICD-10-CM | POA: Diagnosis not present

## 2014-03-24 DIAGNOSIS — N186 End stage renal disease: Secondary | ICD-10-CM | POA: Diagnosis not present

## 2014-03-24 DIAGNOSIS — D509 Iron deficiency anemia, unspecified: Secondary | ICD-10-CM | POA: Diagnosis not present

## 2014-03-24 DIAGNOSIS — Z992 Dependence on renal dialysis: Secondary | ICD-10-CM | POA: Diagnosis not present

## 2014-03-24 DIAGNOSIS — D631 Anemia in chronic kidney disease: Secondary | ICD-10-CM | POA: Diagnosis not present

## 2014-03-26 DIAGNOSIS — D509 Iron deficiency anemia, unspecified: Secondary | ICD-10-CM | POA: Diagnosis not present

## 2014-03-26 DIAGNOSIS — N186 End stage renal disease: Secondary | ICD-10-CM | POA: Diagnosis not present

## 2014-03-26 DIAGNOSIS — N2581 Secondary hyperparathyroidism of renal origin: Secondary | ICD-10-CM | POA: Diagnosis not present

## 2014-03-26 DIAGNOSIS — D631 Anemia in chronic kidney disease: Secondary | ICD-10-CM | POA: Diagnosis not present

## 2014-03-26 DIAGNOSIS — Z992 Dependence on renal dialysis: Secondary | ICD-10-CM | POA: Diagnosis not present

## 2014-03-28 DIAGNOSIS — D631 Anemia in chronic kidney disease: Secondary | ICD-10-CM | POA: Diagnosis not present

## 2014-03-28 DIAGNOSIS — N2581 Secondary hyperparathyroidism of renal origin: Secondary | ICD-10-CM | POA: Diagnosis not present

## 2014-03-28 DIAGNOSIS — N186 End stage renal disease: Secondary | ICD-10-CM | POA: Diagnosis not present

## 2014-03-28 DIAGNOSIS — Z992 Dependence on renal dialysis: Secondary | ICD-10-CM | POA: Diagnosis not present

## 2014-03-28 DIAGNOSIS — D509 Iron deficiency anemia, unspecified: Secondary | ICD-10-CM | POA: Diagnosis not present

## 2014-03-31 DIAGNOSIS — N186 End stage renal disease: Secondary | ICD-10-CM | POA: Diagnosis not present

## 2014-03-31 DIAGNOSIS — Z992 Dependence on renal dialysis: Secondary | ICD-10-CM | POA: Diagnosis not present

## 2014-03-31 DIAGNOSIS — D631 Anemia in chronic kidney disease: Secondary | ICD-10-CM | POA: Diagnosis not present

## 2014-03-31 DIAGNOSIS — D509 Iron deficiency anemia, unspecified: Secondary | ICD-10-CM | POA: Diagnosis not present

## 2014-03-31 DIAGNOSIS — N2581 Secondary hyperparathyroidism of renal origin: Secondary | ICD-10-CM | POA: Diagnosis not present

## 2014-04-02 DIAGNOSIS — Z992 Dependence on renal dialysis: Secondary | ICD-10-CM | POA: Diagnosis not present

## 2014-04-02 DIAGNOSIS — D509 Iron deficiency anemia, unspecified: Secondary | ICD-10-CM | POA: Diagnosis not present

## 2014-04-02 DIAGNOSIS — N2581 Secondary hyperparathyroidism of renal origin: Secondary | ICD-10-CM | POA: Diagnosis not present

## 2014-04-02 DIAGNOSIS — D631 Anemia in chronic kidney disease: Secondary | ICD-10-CM | POA: Diagnosis not present

## 2014-04-02 DIAGNOSIS — N186 End stage renal disease: Secondary | ICD-10-CM | POA: Diagnosis not present

## 2014-04-04 DIAGNOSIS — Z992 Dependence on renal dialysis: Secondary | ICD-10-CM | POA: Diagnosis not present

## 2014-04-04 DIAGNOSIS — D631 Anemia in chronic kidney disease: Secondary | ICD-10-CM | POA: Diagnosis not present

## 2014-04-04 DIAGNOSIS — D509 Iron deficiency anemia, unspecified: Secondary | ICD-10-CM | POA: Diagnosis not present

## 2014-04-04 DIAGNOSIS — N2581 Secondary hyperparathyroidism of renal origin: Secondary | ICD-10-CM | POA: Diagnosis not present

## 2014-04-04 DIAGNOSIS — N186 End stage renal disease: Secondary | ICD-10-CM | POA: Diagnosis not present

## 2014-04-07 DIAGNOSIS — Z992 Dependence on renal dialysis: Secondary | ICD-10-CM | POA: Diagnosis not present

## 2014-04-07 DIAGNOSIS — N186 End stage renal disease: Secondary | ICD-10-CM | POA: Diagnosis not present

## 2014-04-07 DIAGNOSIS — D631 Anemia in chronic kidney disease: Secondary | ICD-10-CM | POA: Diagnosis not present

## 2014-04-07 DIAGNOSIS — D509 Iron deficiency anemia, unspecified: Secondary | ICD-10-CM | POA: Diagnosis not present

## 2014-04-07 DIAGNOSIS — N2581 Secondary hyperparathyroidism of renal origin: Secondary | ICD-10-CM | POA: Diagnosis not present

## 2014-04-09 DIAGNOSIS — D631 Anemia in chronic kidney disease: Secondary | ICD-10-CM | POA: Diagnosis not present

## 2014-04-09 DIAGNOSIS — N186 End stage renal disease: Secondary | ICD-10-CM | POA: Diagnosis not present

## 2014-04-09 DIAGNOSIS — Z992 Dependence on renal dialysis: Secondary | ICD-10-CM | POA: Diagnosis not present

## 2014-04-09 DIAGNOSIS — D509 Iron deficiency anemia, unspecified: Secondary | ICD-10-CM | POA: Diagnosis not present

## 2014-04-09 DIAGNOSIS — N2581 Secondary hyperparathyroidism of renal origin: Secondary | ICD-10-CM | POA: Diagnosis not present

## 2014-04-14 DIAGNOSIS — N2581 Secondary hyperparathyroidism of renal origin: Secondary | ICD-10-CM | POA: Diagnosis not present

## 2014-04-14 DIAGNOSIS — D631 Anemia in chronic kidney disease: Secondary | ICD-10-CM | POA: Diagnosis not present

## 2014-04-14 DIAGNOSIS — N186 End stage renal disease: Secondary | ICD-10-CM | POA: Diagnosis not present

## 2014-04-14 DIAGNOSIS — Z992 Dependence on renal dialysis: Secondary | ICD-10-CM | POA: Diagnosis not present

## 2014-04-14 DIAGNOSIS — D509 Iron deficiency anemia, unspecified: Secondary | ICD-10-CM | POA: Diagnosis not present

## 2014-04-16 DIAGNOSIS — N186 End stage renal disease: Secondary | ICD-10-CM | POA: Diagnosis not present

## 2014-04-16 DIAGNOSIS — D631 Anemia in chronic kidney disease: Secondary | ICD-10-CM | POA: Diagnosis not present

## 2014-04-16 DIAGNOSIS — N2581 Secondary hyperparathyroidism of renal origin: Secondary | ICD-10-CM | POA: Diagnosis not present

## 2014-04-16 DIAGNOSIS — Z992 Dependence on renal dialysis: Secondary | ICD-10-CM | POA: Diagnosis not present

## 2014-04-16 DIAGNOSIS — D509 Iron deficiency anemia, unspecified: Secondary | ICD-10-CM | POA: Diagnosis not present

## 2014-04-18 DIAGNOSIS — N2581 Secondary hyperparathyroidism of renal origin: Secondary | ICD-10-CM | POA: Diagnosis not present

## 2014-04-18 DIAGNOSIS — D631 Anemia in chronic kidney disease: Secondary | ICD-10-CM | POA: Diagnosis not present

## 2014-04-18 DIAGNOSIS — Z992 Dependence on renal dialysis: Secondary | ICD-10-CM | POA: Diagnosis not present

## 2014-04-18 DIAGNOSIS — N186 End stage renal disease: Secondary | ICD-10-CM | POA: Diagnosis not present

## 2014-04-18 DIAGNOSIS — D509 Iron deficiency anemia, unspecified: Secondary | ICD-10-CM | POA: Diagnosis not present

## 2014-04-18 DIAGNOSIS — Z23 Encounter for immunization: Secondary | ICD-10-CM | POA: Diagnosis not present

## 2014-04-21 DIAGNOSIS — N186 End stage renal disease: Secondary | ICD-10-CM | POA: Diagnosis not present

## 2014-04-21 DIAGNOSIS — Z992 Dependence on renal dialysis: Secondary | ICD-10-CM | POA: Diagnosis not present

## 2014-04-21 DIAGNOSIS — D631 Anemia in chronic kidney disease: Secondary | ICD-10-CM | POA: Diagnosis not present

## 2014-04-21 DIAGNOSIS — Z23 Encounter for immunization: Secondary | ICD-10-CM | POA: Diagnosis not present

## 2014-04-21 DIAGNOSIS — N2581 Secondary hyperparathyroidism of renal origin: Secondary | ICD-10-CM | POA: Diagnosis not present

## 2014-04-21 DIAGNOSIS — D509 Iron deficiency anemia, unspecified: Secondary | ICD-10-CM | POA: Diagnosis not present

## 2014-04-23 DIAGNOSIS — N186 End stage renal disease: Secondary | ICD-10-CM | POA: Diagnosis not present

## 2014-04-23 DIAGNOSIS — D509 Iron deficiency anemia, unspecified: Secondary | ICD-10-CM | POA: Diagnosis not present

## 2014-04-23 DIAGNOSIS — D631 Anemia in chronic kidney disease: Secondary | ICD-10-CM | POA: Diagnosis not present

## 2014-04-23 DIAGNOSIS — Z992 Dependence on renal dialysis: Secondary | ICD-10-CM | POA: Diagnosis not present

## 2014-04-23 DIAGNOSIS — Z23 Encounter for immunization: Secondary | ICD-10-CM | POA: Diagnosis not present

## 2014-04-23 DIAGNOSIS — N2581 Secondary hyperparathyroidism of renal origin: Secondary | ICD-10-CM | POA: Diagnosis not present

## 2014-04-25 DIAGNOSIS — D631 Anemia in chronic kidney disease: Secondary | ICD-10-CM | POA: Diagnosis not present

## 2014-04-25 DIAGNOSIS — D509 Iron deficiency anemia, unspecified: Secondary | ICD-10-CM | POA: Diagnosis not present

## 2014-04-25 DIAGNOSIS — N2581 Secondary hyperparathyroidism of renal origin: Secondary | ICD-10-CM | POA: Diagnosis not present

## 2014-04-25 DIAGNOSIS — Z992 Dependence on renal dialysis: Secondary | ICD-10-CM | POA: Diagnosis not present

## 2014-04-25 DIAGNOSIS — Z23 Encounter for immunization: Secondary | ICD-10-CM | POA: Diagnosis not present

## 2014-04-25 DIAGNOSIS — N186 End stage renal disease: Secondary | ICD-10-CM | POA: Diagnosis not present

## 2014-04-28 DIAGNOSIS — N2581 Secondary hyperparathyroidism of renal origin: Secondary | ICD-10-CM | POA: Diagnosis not present

## 2014-04-28 DIAGNOSIS — D509 Iron deficiency anemia, unspecified: Secondary | ICD-10-CM | POA: Diagnosis not present

## 2014-04-28 DIAGNOSIS — E119 Type 2 diabetes mellitus without complications: Secondary | ICD-10-CM | POA: Diagnosis not present

## 2014-04-28 DIAGNOSIS — Z992 Dependence on renal dialysis: Secondary | ICD-10-CM | POA: Diagnosis not present

## 2014-04-28 DIAGNOSIS — D631 Anemia in chronic kidney disease: Secondary | ICD-10-CM | POA: Diagnosis not present

## 2014-04-28 DIAGNOSIS — N186 End stage renal disease: Secondary | ICD-10-CM | POA: Diagnosis not present

## 2014-04-28 DIAGNOSIS — Z23 Encounter for immunization: Secondary | ICD-10-CM | POA: Diagnosis not present

## 2014-04-28 DIAGNOSIS — Z794 Long term (current) use of insulin: Secondary | ICD-10-CM | POA: Diagnosis not present

## 2014-04-30 DIAGNOSIS — D509 Iron deficiency anemia, unspecified: Secondary | ICD-10-CM | POA: Diagnosis not present

## 2014-04-30 DIAGNOSIS — Z23 Encounter for immunization: Secondary | ICD-10-CM | POA: Diagnosis not present

## 2014-04-30 DIAGNOSIS — N2581 Secondary hyperparathyroidism of renal origin: Secondary | ICD-10-CM | POA: Diagnosis not present

## 2014-04-30 DIAGNOSIS — Z992 Dependence on renal dialysis: Secondary | ICD-10-CM | POA: Diagnosis not present

## 2014-04-30 DIAGNOSIS — N186 End stage renal disease: Secondary | ICD-10-CM | POA: Diagnosis not present

## 2014-04-30 DIAGNOSIS — D631 Anemia in chronic kidney disease: Secondary | ICD-10-CM | POA: Diagnosis not present

## 2014-05-01 DIAGNOSIS — Z89512 Acquired absence of left leg below knee: Secondary | ICD-10-CM | POA: Diagnosis not present

## 2014-05-01 DIAGNOSIS — E1165 Type 2 diabetes mellitus with hyperglycemia: Secondary | ICD-10-CM | POA: Diagnosis not present

## 2014-05-01 DIAGNOSIS — N186 End stage renal disease: Secondary | ICD-10-CM | POA: Diagnosis not present

## 2014-05-02 DIAGNOSIS — N186 End stage renal disease: Secondary | ICD-10-CM | POA: Diagnosis not present

## 2014-05-02 DIAGNOSIS — D509 Iron deficiency anemia, unspecified: Secondary | ICD-10-CM | POA: Diagnosis not present

## 2014-05-02 DIAGNOSIS — D631 Anemia in chronic kidney disease: Secondary | ICD-10-CM | POA: Diagnosis not present

## 2014-05-02 DIAGNOSIS — Z23 Encounter for immunization: Secondary | ICD-10-CM | POA: Diagnosis not present

## 2014-05-02 DIAGNOSIS — N2581 Secondary hyperparathyroidism of renal origin: Secondary | ICD-10-CM | POA: Diagnosis not present

## 2014-05-02 DIAGNOSIS — Z992 Dependence on renal dialysis: Secondary | ICD-10-CM | POA: Diagnosis not present

## 2014-05-05 DIAGNOSIS — Z23 Encounter for immunization: Secondary | ICD-10-CM | POA: Diagnosis not present

## 2014-05-05 DIAGNOSIS — N2581 Secondary hyperparathyroidism of renal origin: Secondary | ICD-10-CM | POA: Diagnosis not present

## 2014-05-05 DIAGNOSIS — D631 Anemia in chronic kidney disease: Secondary | ICD-10-CM | POA: Diagnosis not present

## 2014-05-05 DIAGNOSIS — Z992 Dependence on renal dialysis: Secondary | ICD-10-CM | POA: Diagnosis not present

## 2014-05-05 DIAGNOSIS — N186 End stage renal disease: Secondary | ICD-10-CM | POA: Diagnosis not present

## 2014-05-05 DIAGNOSIS — D509 Iron deficiency anemia, unspecified: Secondary | ICD-10-CM | POA: Diagnosis not present

## 2014-05-07 DIAGNOSIS — Z992 Dependence on renal dialysis: Secondary | ICD-10-CM | POA: Diagnosis not present

## 2014-05-07 DIAGNOSIS — N2581 Secondary hyperparathyroidism of renal origin: Secondary | ICD-10-CM | POA: Diagnosis not present

## 2014-05-07 DIAGNOSIS — D509 Iron deficiency anemia, unspecified: Secondary | ICD-10-CM | POA: Diagnosis not present

## 2014-05-07 DIAGNOSIS — Z23 Encounter for immunization: Secondary | ICD-10-CM | POA: Diagnosis not present

## 2014-05-07 DIAGNOSIS — D631 Anemia in chronic kidney disease: Secondary | ICD-10-CM | POA: Diagnosis not present

## 2014-05-07 DIAGNOSIS — N186 End stage renal disease: Secondary | ICD-10-CM | POA: Diagnosis not present

## 2014-05-10 DIAGNOSIS — Z23 Encounter for immunization: Secondary | ICD-10-CM | POA: Diagnosis not present

## 2014-05-10 DIAGNOSIS — N186 End stage renal disease: Secondary | ICD-10-CM | POA: Diagnosis not present

## 2014-05-10 DIAGNOSIS — D509 Iron deficiency anemia, unspecified: Secondary | ICD-10-CM | POA: Diagnosis not present

## 2014-05-10 DIAGNOSIS — N2581 Secondary hyperparathyroidism of renal origin: Secondary | ICD-10-CM | POA: Diagnosis not present

## 2014-05-10 DIAGNOSIS — D631 Anemia in chronic kidney disease: Secondary | ICD-10-CM | POA: Diagnosis not present

## 2014-05-10 DIAGNOSIS — Z992 Dependence on renal dialysis: Secondary | ICD-10-CM | POA: Diagnosis not present

## 2014-05-12 DIAGNOSIS — N2581 Secondary hyperparathyroidism of renal origin: Secondary | ICD-10-CM | POA: Diagnosis not present

## 2014-05-12 DIAGNOSIS — N186 End stage renal disease: Secondary | ICD-10-CM | POA: Diagnosis not present

## 2014-05-12 DIAGNOSIS — Z992 Dependence on renal dialysis: Secondary | ICD-10-CM | POA: Diagnosis not present

## 2014-05-12 DIAGNOSIS — D509 Iron deficiency anemia, unspecified: Secondary | ICD-10-CM | POA: Diagnosis not present

## 2014-05-12 DIAGNOSIS — D631 Anemia in chronic kidney disease: Secondary | ICD-10-CM | POA: Diagnosis not present

## 2014-05-12 DIAGNOSIS — Z23 Encounter for immunization: Secondary | ICD-10-CM | POA: Diagnosis not present

## 2014-05-14 DIAGNOSIS — D631 Anemia in chronic kidney disease: Secondary | ICD-10-CM | POA: Diagnosis not present

## 2014-05-14 DIAGNOSIS — D509 Iron deficiency anemia, unspecified: Secondary | ICD-10-CM | POA: Diagnosis not present

## 2014-05-14 DIAGNOSIS — Z23 Encounter for immunization: Secondary | ICD-10-CM | POA: Diagnosis not present

## 2014-05-14 DIAGNOSIS — N2581 Secondary hyperparathyroidism of renal origin: Secondary | ICD-10-CM | POA: Diagnosis not present

## 2014-05-14 DIAGNOSIS — N186 End stage renal disease: Secondary | ICD-10-CM | POA: Diagnosis not present

## 2014-05-14 DIAGNOSIS — Z992 Dependence on renal dialysis: Secondary | ICD-10-CM | POA: Diagnosis not present

## 2014-05-16 DIAGNOSIS — D509 Iron deficiency anemia, unspecified: Secondary | ICD-10-CM | POA: Diagnosis not present

## 2014-05-16 DIAGNOSIS — N186 End stage renal disease: Secondary | ICD-10-CM | POA: Diagnosis not present

## 2014-05-16 DIAGNOSIS — D631 Anemia in chronic kidney disease: Secondary | ICD-10-CM | POA: Diagnosis not present

## 2014-05-16 DIAGNOSIS — Z992 Dependence on renal dialysis: Secondary | ICD-10-CM | POA: Diagnosis not present

## 2014-05-16 DIAGNOSIS — N2581 Secondary hyperparathyroidism of renal origin: Secondary | ICD-10-CM | POA: Diagnosis not present

## 2014-05-16 DIAGNOSIS — Z23 Encounter for immunization: Secondary | ICD-10-CM | POA: Diagnosis not present

## 2014-05-17 DIAGNOSIS — Z992 Dependence on renal dialysis: Secondary | ICD-10-CM | POA: Diagnosis not present

## 2014-05-17 DIAGNOSIS — N186 End stage renal disease: Secondary | ICD-10-CM | POA: Diagnosis not present

## 2014-05-19 DIAGNOSIS — Z992 Dependence on renal dialysis: Secondary | ICD-10-CM | POA: Diagnosis not present

## 2014-05-19 DIAGNOSIS — D509 Iron deficiency anemia, unspecified: Secondary | ICD-10-CM | POA: Diagnosis not present

## 2014-05-19 DIAGNOSIS — Z23 Encounter for immunization: Secondary | ICD-10-CM | POA: Diagnosis not present

## 2014-05-19 DIAGNOSIS — N186 End stage renal disease: Secondary | ICD-10-CM | POA: Diagnosis not present

## 2014-05-19 DIAGNOSIS — D631 Anemia in chronic kidney disease: Secondary | ICD-10-CM | POA: Diagnosis not present

## 2014-05-21 DIAGNOSIS — Z23 Encounter for immunization: Secondary | ICD-10-CM | POA: Diagnosis not present

## 2014-05-21 DIAGNOSIS — N186 End stage renal disease: Secondary | ICD-10-CM | POA: Diagnosis not present

## 2014-05-21 DIAGNOSIS — D509 Iron deficiency anemia, unspecified: Secondary | ICD-10-CM | POA: Diagnosis not present

## 2014-05-21 DIAGNOSIS — Z992 Dependence on renal dialysis: Secondary | ICD-10-CM | POA: Diagnosis not present

## 2014-05-21 DIAGNOSIS — D631 Anemia in chronic kidney disease: Secondary | ICD-10-CM | POA: Diagnosis not present

## 2014-05-23 DIAGNOSIS — D631 Anemia in chronic kidney disease: Secondary | ICD-10-CM | POA: Diagnosis not present

## 2014-05-23 DIAGNOSIS — N186 End stage renal disease: Secondary | ICD-10-CM | POA: Diagnosis not present

## 2014-05-23 DIAGNOSIS — Z23 Encounter for immunization: Secondary | ICD-10-CM | POA: Diagnosis not present

## 2014-05-23 DIAGNOSIS — D509 Iron deficiency anemia, unspecified: Secondary | ICD-10-CM | POA: Diagnosis not present

## 2014-05-23 DIAGNOSIS — Z992 Dependence on renal dialysis: Secondary | ICD-10-CM | POA: Diagnosis not present

## 2014-05-26 DIAGNOSIS — D631 Anemia in chronic kidney disease: Secondary | ICD-10-CM | POA: Diagnosis not present

## 2014-05-26 DIAGNOSIS — Z23 Encounter for immunization: Secondary | ICD-10-CM | POA: Diagnosis not present

## 2014-05-26 DIAGNOSIS — Z992 Dependence on renal dialysis: Secondary | ICD-10-CM | POA: Diagnosis not present

## 2014-05-26 DIAGNOSIS — N186 End stage renal disease: Secondary | ICD-10-CM | POA: Diagnosis not present

## 2014-05-26 DIAGNOSIS — D509 Iron deficiency anemia, unspecified: Secondary | ICD-10-CM | POA: Diagnosis not present

## 2014-05-28 DIAGNOSIS — Z992 Dependence on renal dialysis: Secondary | ICD-10-CM | POA: Diagnosis not present

## 2014-05-28 DIAGNOSIS — Z23 Encounter for immunization: Secondary | ICD-10-CM | POA: Diagnosis not present

## 2014-05-28 DIAGNOSIS — D631 Anemia in chronic kidney disease: Secondary | ICD-10-CM | POA: Diagnosis not present

## 2014-05-28 DIAGNOSIS — N186 End stage renal disease: Secondary | ICD-10-CM | POA: Diagnosis not present

## 2014-05-28 DIAGNOSIS — D509 Iron deficiency anemia, unspecified: Secondary | ICD-10-CM | POA: Diagnosis not present

## 2014-05-29 DIAGNOSIS — Z8679 Personal history of other diseases of the circulatory system: Secondary | ICD-10-CM | POA: Diagnosis not present

## 2014-05-29 DIAGNOSIS — E11349 Type 2 diabetes mellitus with severe nonproliferative diabetic retinopathy without macular edema: Secondary | ICD-10-CM | POA: Diagnosis not present

## 2014-05-29 DIAGNOSIS — N186 End stage renal disease: Secondary | ICD-10-CM | POA: Diagnosis not present

## 2014-05-29 DIAGNOSIS — Z961 Presence of intraocular lens: Secondary | ICD-10-CM | POA: Diagnosis not present

## 2014-05-29 DIAGNOSIS — E119 Type 2 diabetes mellitus without complications: Secondary | ICD-10-CM | POA: Diagnosis not present

## 2014-05-29 DIAGNOSIS — Z7682 Awaiting organ transplant status: Secondary | ICD-10-CM | POA: Diagnosis not present

## 2014-05-29 DIAGNOSIS — I150 Renovascular hypertension: Secondary | ICD-10-CM | POA: Diagnosis not present

## 2014-05-29 DIAGNOSIS — Z992 Dependence on renal dialysis: Secondary | ICD-10-CM | POA: Diagnosis not present

## 2014-05-29 DIAGNOSIS — Z125 Encounter for screening for malignant neoplasm of prostate: Secondary | ICD-10-CM | POA: Diagnosis not present

## 2014-05-29 DIAGNOSIS — Z01818 Encounter for other preprocedural examination: Secondary | ICD-10-CM | POA: Diagnosis not present

## 2014-05-30 DIAGNOSIS — Z23 Encounter for immunization: Secondary | ICD-10-CM | POA: Diagnosis not present

## 2014-05-30 DIAGNOSIS — N186 End stage renal disease: Secondary | ICD-10-CM | POA: Diagnosis not present

## 2014-05-30 DIAGNOSIS — D509 Iron deficiency anemia, unspecified: Secondary | ICD-10-CM | POA: Diagnosis not present

## 2014-05-30 DIAGNOSIS — Z992 Dependence on renal dialysis: Secondary | ICD-10-CM | POA: Diagnosis not present

## 2014-05-30 DIAGNOSIS — D631 Anemia in chronic kidney disease: Secondary | ICD-10-CM | POA: Diagnosis not present

## 2014-06-02 DIAGNOSIS — N186 End stage renal disease: Secondary | ICD-10-CM | POA: Diagnosis not present

## 2014-06-02 DIAGNOSIS — D631 Anemia in chronic kidney disease: Secondary | ICD-10-CM | POA: Diagnosis not present

## 2014-06-02 DIAGNOSIS — D509 Iron deficiency anemia, unspecified: Secondary | ICD-10-CM | POA: Diagnosis not present

## 2014-06-02 DIAGNOSIS — Z992 Dependence on renal dialysis: Secondary | ICD-10-CM | POA: Diagnosis not present

## 2014-06-02 DIAGNOSIS — Z23 Encounter for immunization: Secondary | ICD-10-CM | POA: Diagnosis not present

## 2014-06-04 DIAGNOSIS — D631 Anemia in chronic kidney disease: Secondary | ICD-10-CM | POA: Diagnosis not present

## 2014-06-04 DIAGNOSIS — N186 End stage renal disease: Secondary | ICD-10-CM | POA: Diagnosis not present

## 2014-06-04 DIAGNOSIS — D509 Iron deficiency anemia, unspecified: Secondary | ICD-10-CM | POA: Diagnosis not present

## 2014-06-04 DIAGNOSIS — Z992 Dependence on renal dialysis: Secondary | ICD-10-CM | POA: Diagnosis not present

## 2014-06-04 DIAGNOSIS — Z23 Encounter for immunization: Secondary | ICD-10-CM | POA: Diagnosis not present

## 2014-06-06 DIAGNOSIS — Z23 Encounter for immunization: Secondary | ICD-10-CM | POA: Diagnosis not present

## 2014-06-06 DIAGNOSIS — Z992 Dependence on renal dialysis: Secondary | ICD-10-CM | POA: Diagnosis not present

## 2014-06-06 DIAGNOSIS — D631 Anemia in chronic kidney disease: Secondary | ICD-10-CM | POA: Diagnosis not present

## 2014-06-06 DIAGNOSIS — D509 Iron deficiency anemia, unspecified: Secondary | ICD-10-CM | POA: Diagnosis not present

## 2014-06-06 DIAGNOSIS — N186 End stage renal disease: Secondary | ICD-10-CM | POA: Diagnosis not present

## 2014-06-09 DIAGNOSIS — D509 Iron deficiency anemia, unspecified: Secondary | ICD-10-CM | POA: Diagnosis not present

## 2014-06-09 DIAGNOSIS — Z992 Dependence on renal dialysis: Secondary | ICD-10-CM | POA: Diagnosis not present

## 2014-06-09 DIAGNOSIS — Z23 Encounter for immunization: Secondary | ICD-10-CM | POA: Diagnosis not present

## 2014-06-09 DIAGNOSIS — D631 Anemia in chronic kidney disease: Secondary | ICD-10-CM | POA: Diagnosis not present

## 2014-06-09 DIAGNOSIS — N186 End stage renal disease: Secondary | ICD-10-CM | POA: Diagnosis not present

## 2014-06-11 DIAGNOSIS — D509 Iron deficiency anemia, unspecified: Secondary | ICD-10-CM | POA: Diagnosis not present

## 2014-06-11 DIAGNOSIS — Z89512 Acquired absence of left leg below knee: Secondary | ICD-10-CM | POA: Diagnosis not present

## 2014-06-11 DIAGNOSIS — I10 Essential (primary) hypertension: Secondary | ICD-10-CM | POA: Diagnosis not present

## 2014-06-11 DIAGNOSIS — Z992 Dependence on renal dialysis: Secondary | ICD-10-CM | POA: Diagnosis not present

## 2014-06-11 DIAGNOSIS — D631 Anemia in chronic kidney disease: Secondary | ICD-10-CM | POA: Diagnosis not present

## 2014-06-11 DIAGNOSIS — N186 End stage renal disease: Secondary | ICD-10-CM | POA: Diagnosis not present

## 2014-06-11 DIAGNOSIS — E1165 Type 2 diabetes mellitus with hyperglycemia: Secondary | ICD-10-CM | POA: Diagnosis not present

## 2014-06-11 DIAGNOSIS — Z23 Encounter for immunization: Secondary | ICD-10-CM | POA: Diagnosis not present

## 2014-06-13 DIAGNOSIS — Z23 Encounter for immunization: Secondary | ICD-10-CM | POA: Diagnosis not present

## 2014-06-13 DIAGNOSIS — D509 Iron deficiency anemia, unspecified: Secondary | ICD-10-CM | POA: Diagnosis not present

## 2014-06-13 DIAGNOSIS — N186 End stage renal disease: Secondary | ICD-10-CM | POA: Diagnosis not present

## 2014-06-13 DIAGNOSIS — Z992 Dependence on renal dialysis: Secondary | ICD-10-CM | POA: Diagnosis not present

## 2014-06-13 DIAGNOSIS — D631 Anemia in chronic kidney disease: Secondary | ICD-10-CM | POA: Diagnosis not present

## 2014-06-15 DIAGNOSIS — Z992 Dependence on renal dialysis: Secondary | ICD-10-CM | POA: Diagnosis not present

## 2014-06-15 DIAGNOSIS — N186 End stage renal disease: Secondary | ICD-10-CM | POA: Diagnosis not present

## 2014-06-16 DIAGNOSIS — N186 End stage renal disease: Secondary | ICD-10-CM | POA: Diagnosis not present

## 2014-06-16 DIAGNOSIS — D509 Iron deficiency anemia, unspecified: Secondary | ICD-10-CM | POA: Diagnosis not present

## 2014-06-16 DIAGNOSIS — D631 Anemia in chronic kidney disease: Secondary | ICD-10-CM | POA: Diagnosis not present

## 2014-06-16 DIAGNOSIS — Z23 Encounter for immunization: Secondary | ICD-10-CM | POA: Diagnosis not present

## 2014-06-16 DIAGNOSIS — Z992 Dependence on renal dialysis: Secondary | ICD-10-CM | POA: Diagnosis not present

## 2014-06-17 DIAGNOSIS — H35371 Puckering of macula, right eye: Secondary | ICD-10-CM | POA: Diagnosis not present

## 2014-06-17 DIAGNOSIS — E11359 Type 2 diabetes mellitus with proliferative diabetic retinopathy without macular edema: Secondary | ICD-10-CM | POA: Diagnosis not present

## 2014-06-17 DIAGNOSIS — H2512 Age-related nuclear cataract, left eye: Secondary | ICD-10-CM | POA: Diagnosis not present

## 2014-06-17 DIAGNOSIS — H35372 Puckering of macula, left eye: Secondary | ICD-10-CM | POA: Diagnosis not present

## 2014-06-17 DIAGNOSIS — H3531 Nonexudative age-related macular degeneration: Secondary | ICD-10-CM | POA: Diagnosis not present

## 2014-06-17 DIAGNOSIS — H26491 Other secondary cataract, right eye: Secondary | ICD-10-CM | POA: Diagnosis not present

## 2014-06-18 DIAGNOSIS — D631 Anemia in chronic kidney disease: Secondary | ICD-10-CM | POA: Diagnosis not present

## 2014-06-18 DIAGNOSIS — N186 End stage renal disease: Secondary | ICD-10-CM | POA: Diagnosis not present

## 2014-06-18 DIAGNOSIS — Z23 Encounter for immunization: Secondary | ICD-10-CM | POA: Diagnosis not present

## 2014-06-18 DIAGNOSIS — Z992 Dependence on renal dialysis: Secondary | ICD-10-CM | POA: Diagnosis not present

## 2014-06-18 DIAGNOSIS — D509 Iron deficiency anemia, unspecified: Secondary | ICD-10-CM | POA: Diagnosis not present

## 2014-06-20 DIAGNOSIS — N186 End stage renal disease: Secondary | ICD-10-CM | POA: Diagnosis not present

## 2014-06-20 DIAGNOSIS — D509 Iron deficiency anemia, unspecified: Secondary | ICD-10-CM | POA: Diagnosis not present

## 2014-06-20 DIAGNOSIS — Z992 Dependence on renal dialysis: Secondary | ICD-10-CM | POA: Diagnosis not present

## 2014-06-20 DIAGNOSIS — Z23 Encounter for immunization: Secondary | ICD-10-CM | POA: Diagnosis not present

## 2014-06-20 DIAGNOSIS — D631 Anemia in chronic kidney disease: Secondary | ICD-10-CM | POA: Diagnosis not present

## 2014-06-23 DIAGNOSIS — N186 End stage renal disease: Secondary | ICD-10-CM | POA: Diagnosis not present

## 2014-06-23 DIAGNOSIS — Z23 Encounter for immunization: Secondary | ICD-10-CM | POA: Diagnosis not present

## 2014-06-23 DIAGNOSIS — D631 Anemia in chronic kidney disease: Secondary | ICD-10-CM | POA: Diagnosis not present

## 2014-06-23 DIAGNOSIS — D509 Iron deficiency anemia, unspecified: Secondary | ICD-10-CM | POA: Diagnosis not present

## 2014-06-23 DIAGNOSIS — Z992 Dependence on renal dialysis: Secondary | ICD-10-CM | POA: Diagnosis not present

## 2014-06-25 DIAGNOSIS — Z992 Dependence on renal dialysis: Secondary | ICD-10-CM | POA: Diagnosis not present

## 2014-06-25 DIAGNOSIS — D509 Iron deficiency anemia, unspecified: Secondary | ICD-10-CM | POA: Diagnosis not present

## 2014-06-25 DIAGNOSIS — Z23 Encounter for immunization: Secondary | ICD-10-CM | POA: Diagnosis not present

## 2014-06-25 DIAGNOSIS — N186 End stage renal disease: Secondary | ICD-10-CM | POA: Diagnosis not present

## 2014-06-25 DIAGNOSIS — D631 Anemia in chronic kidney disease: Secondary | ICD-10-CM | POA: Diagnosis not present

## 2014-06-27 DIAGNOSIS — Z23 Encounter for immunization: Secondary | ICD-10-CM | POA: Diagnosis not present

## 2014-06-27 DIAGNOSIS — D509 Iron deficiency anemia, unspecified: Secondary | ICD-10-CM | POA: Diagnosis not present

## 2014-06-27 DIAGNOSIS — D631 Anemia in chronic kidney disease: Secondary | ICD-10-CM | POA: Diagnosis not present

## 2014-06-27 DIAGNOSIS — N186 End stage renal disease: Secondary | ICD-10-CM | POA: Diagnosis not present

## 2014-06-27 DIAGNOSIS — Z992 Dependence on renal dialysis: Secondary | ICD-10-CM | POA: Diagnosis not present

## 2014-06-29 DIAGNOSIS — H35372 Puckering of macula, left eye: Secondary | ICD-10-CM | POA: Diagnosis not present

## 2014-06-29 DIAGNOSIS — H26491 Other secondary cataract, right eye: Secondary | ICD-10-CM | POA: Diagnosis not present

## 2014-06-29 DIAGNOSIS — H35371 Puckering of macula, right eye: Secondary | ICD-10-CM | POA: Diagnosis not present

## 2014-06-29 DIAGNOSIS — Z961 Presence of intraocular lens: Secondary | ICD-10-CM | POA: Diagnosis not present

## 2014-06-29 DIAGNOSIS — E11359 Type 2 diabetes mellitus with proliferative diabetic retinopathy without macular edema: Secondary | ICD-10-CM | POA: Diagnosis not present

## 2014-06-29 DIAGNOSIS — H2512 Age-related nuclear cataract, left eye: Secondary | ICD-10-CM | POA: Diagnosis not present

## 2014-06-30 DIAGNOSIS — Z23 Encounter for immunization: Secondary | ICD-10-CM | POA: Diagnosis not present

## 2014-06-30 DIAGNOSIS — D509 Iron deficiency anemia, unspecified: Secondary | ICD-10-CM | POA: Diagnosis not present

## 2014-06-30 DIAGNOSIS — D631 Anemia in chronic kidney disease: Secondary | ICD-10-CM | POA: Diagnosis not present

## 2014-06-30 DIAGNOSIS — N186 End stage renal disease: Secondary | ICD-10-CM | POA: Diagnosis not present

## 2014-06-30 DIAGNOSIS — Z992 Dependence on renal dialysis: Secondary | ICD-10-CM | POA: Diagnosis not present

## 2014-07-01 ENCOUNTER — Other Ambulatory Visit (HOSPITAL_COMMUNITY): Payer: Self-pay | Admitting: Internal Medicine

## 2014-07-01 DIAGNOSIS — N19 Unspecified kidney failure: Secondary | ICD-10-CM

## 2014-07-02 DIAGNOSIS — D631 Anemia in chronic kidney disease: Secondary | ICD-10-CM | POA: Diagnosis not present

## 2014-07-02 DIAGNOSIS — D509 Iron deficiency anemia, unspecified: Secondary | ICD-10-CM | POA: Diagnosis not present

## 2014-07-02 DIAGNOSIS — Z992 Dependence on renal dialysis: Secondary | ICD-10-CM | POA: Diagnosis not present

## 2014-07-02 DIAGNOSIS — Z23 Encounter for immunization: Secondary | ICD-10-CM | POA: Diagnosis not present

## 2014-07-02 DIAGNOSIS — N186 End stage renal disease: Secondary | ICD-10-CM | POA: Diagnosis not present

## 2014-07-04 DIAGNOSIS — D509 Iron deficiency anemia, unspecified: Secondary | ICD-10-CM | POA: Diagnosis not present

## 2014-07-04 DIAGNOSIS — D631 Anemia in chronic kidney disease: Secondary | ICD-10-CM | POA: Diagnosis not present

## 2014-07-04 DIAGNOSIS — N186 End stage renal disease: Secondary | ICD-10-CM | POA: Diagnosis not present

## 2014-07-04 DIAGNOSIS — Z23 Encounter for immunization: Secondary | ICD-10-CM | POA: Diagnosis not present

## 2014-07-04 DIAGNOSIS — Z992 Dependence on renal dialysis: Secondary | ICD-10-CM | POA: Diagnosis not present

## 2014-07-07 ENCOUNTER — Encounter: Payer: Self-pay | Admitting: Cardiology

## 2014-07-07 DIAGNOSIS — Z992 Dependence on renal dialysis: Secondary | ICD-10-CM | POA: Diagnosis not present

## 2014-07-07 DIAGNOSIS — D509 Iron deficiency anemia, unspecified: Secondary | ICD-10-CM | POA: Diagnosis not present

## 2014-07-07 DIAGNOSIS — Z23 Encounter for immunization: Secondary | ICD-10-CM | POA: Diagnosis not present

## 2014-07-07 DIAGNOSIS — N186 End stage renal disease: Secondary | ICD-10-CM | POA: Diagnosis not present

## 2014-07-07 DIAGNOSIS — D631 Anemia in chronic kidney disease: Secondary | ICD-10-CM | POA: Diagnosis not present

## 2014-07-07 NOTE — Progress Notes (Signed)
No show  This encounter was created in error - please disregard.

## 2014-07-08 ENCOUNTER — Encounter: Payer: Self-pay | Admitting: Cardiology

## 2014-07-09 ENCOUNTER — Other Ambulatory Visit (HOSPITAL_COMMUNITY): Payer: Self-pay | Admitting: Internal Medicine

## 2014-07-09 ENCOUNTER — Ambulatory Visit (HOSPITAL_COMMUNITY)
Admission: RE | Admit: 2014-07-09 | Discharge: 2014-07-09 | Disposition: A | Discharge status: Home / Self Care | Payer: Medicare Other | Source: Ambulatory Visit | Attending: Internal Medicine | Admitting: Internal Medicine

## 2014-07-09 DIAGNOSIS — Z992 Dependence on renal dialysis: Secondary | ICD-10-CM | POA: Diagnosis not present

## 2014-07-09 DIAGNOSIS — N19 Unspecified kidney failure: Secondary | ICD-10-CM

## 2014-07-09 DIAGNOSIS — Z23 Encounter for immunization: Secondary | ICD-10-CM | POA: Diagnosis not present

## 2014-07-09 DIAGNOSIS — D631 Anemia in chronic kidney disease: Secondary | ICD-10-CM | POA: Diagnosis not present

## 2014-07-09 DIAGNOSIS — D509 Iron deficiency anemia, unspecified: Secondary | ICD-10-CM | POA: Diagnosis not present

## 2014-07-09 DIAGNOSIS — N186 End stage renal disease: Secondary | ICD-10-CM | POA: Diagnosis not present

## 2014-07-11 DIAGNOSIS — N186 End stage renal disease: Secondary | ICD-10-CM | POA: Diagnosis not present

## 2014-07-11 DIAGNOSIS — D631 Anemia in chronic kidney disease: Secondary | ICD-10-CM | POA: Diagnosis not present

## 2014-07-11 DIAGNOSIS — D509 Iron deficiency anemia, unspecified: Secondary | ICD-10-CM | POA: Diagnosis not present

## 2014-07-11 DIAGNOSIS — Z23 Encounter for immunization: Secondary | ICD-10-CM | POA: Diagnosis not present

## 2014-07-11 DIAGNOSIS — Z992 Dependence on renal dialysis: Secondary | ICD-10-CM | POA: Diagnosis not present

## 2014-07-13 ENCOUNTER — Other Ambulatory Visit: Payer: Self-pay | Admitting: *Deleted

## 2014-07-13 DIAGNOSIS — T82510A Breakdown (mechanical) of surgically created arteriovenous fistula, initial encounter: Secondary | ICD-10-CM

## 2014-07-14 ENCOUNTER — Encounter: Payer: Self-pay | Admitting: Vascular Surgery

## 2014-07-14 DIAGNOSIS — Z23 Encounter for immunization: Secondary | ICD-10-CM | POA: Diagnosis not present

## 2014-07-14 DIAGNOSIS — Z992 Dependence on renal dialysis: Secondary | ICD-10-CM | POA: Diagnosis not present

## 2014-07-14 DIAGNOSIS — D631 Anemia in chronic kidney disease: Secondary | ICD-10-CM | POA: Diagnosis not present

## 2014-07-14 DIAGNOSIS — D509 Iron deficiency anemia, unspecified: Secondary | ICD-10-CM | POA: Diagnosis not present

## 2014-07-14 DIAGNOSIS — N186 End stage renal disease: Secondary | ICD-10-CM | POA: Diagnosis not present

## 2014-07-15 ENCOUNTER — Other Ambulatory Visit: Payer: Self-pay

## 2014-07-15 ENCOUNTER — Ambulatory Visit (INDEPENDENT_AMBULATORY_CARE_PROVIDER_SITE_OTHER): Payer: Medicare Other | Admitting: Vascular Surgery

## 2014-07-15 ENCOUNTER — Ambulatory Visit (HOSPITAL_COMMUNITY)
Admission: RE | Admit: 2014-07-15 | Discharge: 2014-07-15 | Disposition: A | Discharge status: Home / Self Care | Payer: Medicare Other | Source: Ambulatory Visit | Attending: Vascular Surgery | Admitting: Vascular Surgery

## 2014-07-15 ENCOUNTER — Encounter: Payer: Self-pay | Admitting: Vascular Surgery

## 2014-07-15 VITALS — BP 138/70 | HR 69 | Temp 98.3°F | Resp 16 | Ht 72.0 in | Wt 161.0 lb

## 2014-07-15 DIAGNOSIS — Z992 Dependence on renal dialysis: Secondary | ICD-10-CM | POA: Diagnosis not present

## 2014-07-15 DIAGNOSIS — T82510A Breakdown (mechanical) of surgically created arteriovenous fistula, initial encounter: Secondary | ICD-10-CM | POA: Diagnosis not present

## 2014-07-15 DIAGNOSIS — N186 End stage renal disease: Secondary | ICD-10-CM | POA: Diagnosis not present

## 2014-07-15 NOTE — Progress Notes (Signed)
HISTORY AND PHYSICAL     CC:  Evaluate fistula Referring Provider:  Rosita Fire, MD  HPI: This is a 64 y.o. male who underwent a left brachiocephalic AVF on 8/93/73.  At that time, he was not yet on HD.  He is now on HD T/T/S.  He states that recently, they began having trouble sticking the fistula.  He states that depending on who is cannulating the fistula depends on if they have trouble.  The more proximal the fistula is stuck, the harder it is to run the dialysis machine.  He is referred for evaluation of his fistula.    He is on a statin for his cholesterol.  He is on insulin for his DM.  He also takes a daily baby aspirin.  Past Medical History  Diagnosis Date  . Type 2 diabetes mellitus   . Essential hypertension   . Anemia of chronic disease   . Peripheral vascular disease   . Sarcoidosis   . ESRD (end stage renal disease)     Hemodialysis since December 2015    Past Surgical History  Procedure Laterality Date  . Below knee leg amputation Left   . 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 Esophageal dilation      8-10 yrs ago   . Eye surgery Right   . Av fistula placement Left 12/31/2012    Procedure: ARTERIOVENOUS (AV) FISTULA CREATION-LEFT;  Surgeon: Angelia Mould, MD;  Location: [place];  Service: Vascular;  Laterality: Left;    No Known Allergies  Current Outpatient Prescriptions  Medication Sig Dispense Refill  . aspirin EC 81 MG tablet Take 81 mg by mouth daily.    Marland Kitchen atorvastatin (LIPITOR) 40 MG tablet Take 40 mg by mouth daily.    . calcitRIOL (ROCALTROL) 0.25 MCG capsule Take 0.25 mcg by mouth daily.    . cloNIDine (CATAPRES) 0.2 MG tablet Take 1 tablet by mouth 2 (two) times daily.     . ferrous sulfate 325 (65 FE) MG tablet Take 325 mg by mouth daily with breakfast.    . insulin glargine (LANTUS) 100 UNIT/ML injection Inject 10-20 Units into the skin at bedtime. IF CBG>120=20 units    . Multiple  Vitamin (MULTIVITAMIN WITH MINERALS) TABS tablet Take 1 tablet by mouth daily.    Marland Kitchen NIFEdipine (PROCARDIA-XL/ADALAT CC) 60 MG 24 hr tablet Take 60 mg by mouth daily.    Marland Kitchen oxyCODONE (ROXICODONE) 5 MG immediate release tablet Take 1 tablet (5 mg total) by mouth every 6 (six) hours as needed for pain. 30 tablet 0  . sitaGLIPtin (JANUVIA) 100 MG tablet Take 100 mg by mouth daily.     No current facility-administered medications for this visit.    Family History  Problem Relation Age of Onset  . Diabetes Mellitus II Mother     History   Social History  . Marital Status: Married    Spouse Name: N/A  . Number of Children: N/A  . Years of Education: N/A   Occupational History  . Not on file.   Social History Main Topics  . Smoking status: Former Smoker    Types: Cigarettes    Quit date: 04/17/1998  . Smokeless tobacco: Not on file  . Alcohol Use: 0.0 oz/week    0 Standard drinks or equivalent per week     Comment: Socially  . Drug Use: No  . Sexual Activity: Not on file  Other Topics Concern  . Not on file   Social History Narrative     ROS: [x]  Positive   [ ]  Negative   [ ]  All sytems reviewed and are negative  Cardiovascular: []  chest pain/pressure []  palpitations []  SOB lying flat []  DOE []  pain in legs while walking []  pain in feet when lying flat []  hx of DVT []  hx of phlebitis []  swelling in legs []  varicose veins  Pulmonary: []  productive cough []  asthma []  wheezing  Neurologic: []  weakness in []  arms []  legs []  numbness in []  arms []  legs [] difficulty speaking or slurred speech [x]  loss of vision-legally blind []  dizziness  Hematologic: []  bleeding problems []  problems with blood clotting easily  GI []  vomiting blood []  blood in stool  GU: []  burning with urination []  blood in urine  Psychiatric: []  hx of major depression  Integumentary: []  rashes []  ulcers  Constitutional: []  fever []  chills   PHYSICAL EXAMINATION:  Vital  signs: Filed Vitals:   07/15/14 1454  Height: 6' (1.829 m)  Weight: 161 lb (73.029 kg)     General:  WDWN in NAD Gait: Not observed HENT: WNL, normocephalic Pulmonary: normal non-labored breathing  Cardiac: RRR Abdomen: soft, NT, no masses Skin: with rashes, with ulcers  Vascular Exam/Pulses: Pt has 2+ left radial pulse There is a good thrill within the fistula distally at the anastomosis, but becomes more pulsatile proximall Extremities: without ischemic changes, without Gangrene , without cellulitis; without open wounds;  Musculoskeletal: no muscle wasting or atrophy  Neurologic: A&O X 3; Appropriate Affect ; SENSATION: normal; MOTOR FUNCTION:  moving all extremities equally. Speech is fluent/normal   Non-Invasive Vascular Imaging:   There is a narrowing in diameter in the mid/proximal upper arm fistula.  Pt meds includes: Statin:  Yes.   Beta Blocker:  No. Aspirin:  Yes.   ACEI:  No. ARB:  No. Other Antiplatelet/Anticoagulant:  No.    ASSESSMENT/PLAN:: 64 y.o. male who had a left brachiocephalic AVF placed 06/9765 who has not had any issues with his fistula until recently.   -difficulty cannulating the fistula at times.  He does have a narrowing in the proximal portion of the fistula. -will schedule him for a fistulogram on July 27, 2014 with possible intervention. -questions answered by Dr. Scot Dock.   Leontine Locket, PA-C [facility] [phone]  Clinic MD:  Pt seen and examined in conjunction with Dr. Scot Dock  Agree with above. The fistula is pulsatile and therefore I have recommended that we proceed with a fistulogram. This has been scheduled for 07/27/2014. We have discussed the procedure potential complications and he is agreeable to proceed.  Deitra Mayo, MD, [place] [phone] Office: [phone]

## 2014-07-16 DIAGNOSIS — Z23 Encounter for immunization: Secondary | ICD-10-CM | POA: Diagnosis not present

## 2014-07-16 DIAGNOSIS — N186 End stage renal disease: Secondary | ICD-10-CM | POA: Diagnosis not present

## 2014-07-16 DIAGNOSIS — D509 Iron deficiency anemia, unspecified: Secondary | ICD-10-CM | POA: Diagnosis not present

## 2014-07-16 DIAGNOSIS — D631 Anemia in chronic kidney disease: Secondary | ICD-10-CM | POA: Diagnosis not present

## 2014-07-16 DIAGNOSIS — Z992 Dependence on renal dialysis: Secondary | ICD-10-CM | POA: Diagnosis not present

## 2014-07-18 DIAGNOSIS — Z992 Dependence on renal dialysis: Secondary | ICD-10-CM | POA: Diagnosis not present

## 2014-07-18 DIAGNOSIS — N2581 Secondary hyperparathyroidism of renal origin: Secondary | ICD-10-CM | POA: Diagnosis not present

## 2014-07-18 DIAGNOSIS — D509 Iron deficiency anemia, unspecified: Secondary | ICD-10-CM | POA: Diagnosis not present

## 2014-07-18 DIAGNOSIS — N186 End stage renal disease: Secondary | ICD-10-CM | POA: Diagnosis not present

## 2014-07-18 DIAGNOSIS — D631 Anemia in chronic kidney disease: Secondary | ICD-10-CM | POA: Diagnosis not present

## 2014-07-21 DIAGNOSIS — D509 Iron deficiency anemia, unspecified: Secondary | ICD-10-CM | POA: Diagnosis not present

## 2014-07-21 DIAGNOSIS — D631 Anemia in chronic kidney disease: Secondary | ICD-10-CM | POA: Diagnosis not present

## 2014-07-21 DIAGNOSIS — N2581 Secondary hyperparathyroidism of renal origin: Secondary | ICD-10-CM | POA: Diagnosis not present

## 2014-07-21 DIAGNOSIS — N186 End stage renal disease: Secondary | ICD-10-CM | POA: Diagnosis not present

## 2014-07-21 DIAGNOSIS — Z992 Dependence on renal dialysis: Secondary | ICD-10-CM | POA: Diagnosis not present

## 2014-07-23 DIAGNOSIS — Z992 Dependence on renal dialysis: Secondary | ICD-10-CM | POA: Diagnosis not present

## 2014-07-23 DIAGNOSIS — D631 Anemia in chronic kidney disease: Secondary | ICD-10-CM | POA: Diagnosis not present

## 2014-07-23 DIAGNOSIS — N186 End stage renal disease: Secondary | ICD-10-CM | POA: Diagnosis not present

## 2014-07-23 DIAGNOSIS — N2581 Secondary hyperparathyroidism of renal origin: Secondary | ICD-10-CM | POA: Diagnosis not present

## 2014-07-23 DIAGNOSIS — D509 Iron deficiency anemia, unspecified: Secondary | ICD-10-CM | POA: Diagnosis not present

## 2014-07-25 DIAGNOSIS — N186 End stage renal disease: Secondary | ICD-10-CM | POA: Diagnosis not present

## 2014-07-25 DIAGNOSIS — D509 Iron deficiency anemia, unspecified: Secondary | ICD-10-CM | POA: Diagnosis not present

## 2014-07-25 DIAGNOSIS — Z992 Dependence on renal dialysis: Secondary | ICD-10-CM | POA: Diagnosis not present

## 2014-07-25 DIAGNOSIS — D631 Anemia in chronic kidney disease: Secondary | ICD-10-CM | POA: Diagnosis not present

## 2014-07-25 DIAGNOSIS — N2581 Secondary hyperparathyroidism of renal origin: Secondary | ICD-10-CM | POA: Diagnosis not present

## 2014-07-27 ENCOUNTER — Ambulatory Visit (HOSPITAL_COMMUNITY)
Admission: RE | Admit: 2014-07-27 | Discharge: 2014-07-27 | Disposition: A | Discharge status: Home / Self Care | Payer: Medicare Other | Source: Ambulatory Visit | Attending: Vascular Surgery | Admitting: Vascular Surgery

## 2014-07-27 ENCOUNTER — Encounter (HOSPITAL_COMMUNITY)
Admission: RE | Disposition: A | Discharge status: Home / Self Care | Payer: Self-pay | Source: Ambulatory Visit | Attending: Vascular Surgery

## 2014-07-27 ENCOUNTER — Encounter (HOSPITAL_COMMUNITY): Payer: Self-pay | Admitting: Vascular Surgery

## 2014-07-27 DIAGNOSIS — T82898A Other specified complication of vascular prosthetic devices, implants and grafts, initial encounter: Secondary | ICD-10-CM | POA: Diagnosis not present

## 2014-07-27 DIAGNOSIS — E119 Type 2 diabetes mellitus without complications: Secondary | ICD-10-CM | POA: Insufficient documentation

## 2014-07-27 DIAGNOSIS — N186 End stage renal disease: Secondary | ICD-10-CM | POA: Insufficient documentation

## 2014-07-27 DIAGNOSIS — Z79891 Long term (current) use of opiate analgesic: Secondary | ICD-10-CM | POA: Diagnosis not present

## 2014-07-27 DIAGNOSIS — Z794 Long term (current) use of insulin: Secondary | ICD-10-CM | POA: Insufficient documentation

## 2014-07-27 DIAGNOSIS — Z89421 Acquired absence of other right toe(s): Secondary | ICD-10-CM | POA: Insufficient documentation

## 2014-07-27 DIAGNOSIS — Y929 Unspecified place or not applicable: Secondary | ICD-10-CM | POA: Diagnosis not present

## 2014-07-27 DIAGNOSIS — Y832 Surgical operation with anastomosis, bypass or graft as the cause of abnormal reaction of the patient, or of later complication, without mention of misadventure at the time of the procedure: Secondary | ICD-10-CM | POA: Diagnosis not present

## 2014-07-27 DIAGNOSIS — Z87891 Personal history of nicotine dependence: Secondary | ICD-10-CM | POA: Insufficient documentation

## 2014-07-27 DIAGNOSIS — Z89512 Acquired absence of left leg below knee: Secondary | ICD-10-CM | POA: Diagnosis not present

## 2014-07-27 DIAGNOSIS — E780 Pure hypercholesterolemia: Secondary | ICD-10-CM | POA: Diagnosis not present

## 2014-07-27 DIAGNOSIS — I739 Peripheral vascular disease, unspecified: Secondary | ICD-10-CM | POA: Insufficient documentation

## 2014-07-27 DIAGNOSIS — I120 Hypertensive chronic kidney disease with stage 5 chronic kidney disease or end stage renal disease: Secondary | ICD-10-CM | POA: Insufficient documentation

## 2014-07-27 DIAGNOSIS — D638 Anemia in other chronic diseases classified elsewhere: Secondary | ICD-10-CM | POA: Diagnosis not present

## 2014-07-27 DIAGNOSIS — Z992 Dependence on renal dialysis: Secondary | ICD-10-CM | POA: Insufficient documentation

## 2014-07-27 DIAGNOSIS — T82858A Stenosis of vascular prosthetic devices, implants and grafts, initial encounter: Secondary | ICD-10-CM | POA: Diagnosis not present

## 2014-07-27 DIAGNOSIS — Z7982 Long term (current) use of aspirin: Secondary | ICD-10-CM | POA: Diagnosis not present

## 2014-07-27 DIAGNOSIS — Z79899 Other long term (current) drug therapy: Secondary | ICD-10-CM | POA: Diagnosis not present

## 2014-07-27 HISTORY — PX: FISTULOGRAM: SHX5832

## 2014-07-27 LAB — POCT I-STAT, CHEM 8
BUN: 39 mg/dL — AB (ref 6–23)
CHLORIDE: 105 mmol/L (ref 96–112)
Calcium, Ion: 1.16 mmol/L (ref 1.13–1.30)
Creatinine, Ser: 7.1 mg/dL — ABNORMAL HIGH (ref 0.50–1.35)
Glucose, Bld: 103 mg/dL — ABNORMAL HIGH (ref 70–99)
HEMATOCRIT: 38 % — AB (ref 39.0–52.0)
Hemoglobin: 12.9 g/dL — ABNORMAL LOW (ref 13.0–17.0)
Potassium: 4 mmol/L (ref 3.5–5.1)
SODIUM: 142 mmol/L (ref 135–145)
TCO2: 22 mmol/L (ref 0–100)

## 2014-07-27 LAB — GLUCOSE, CAPILLARY
GLUCOSE-CAPILLARY: 78 mg/dL (ref 70–99)
Glucose-Capillary: 118 mg/dL — ABNORMAL HIGH (ref 70–99)

## 2014-07-27 SURGERY — FISTULOGRAM
Anesthesia: LOCAL

## 2014-07-27 MED ORDER — SODIUM CHLORIDE 0.9 % IJ SOLN: 3.0000 mL | INTRAMUSCULAR | Status: DC | PRN

## 2014-07-27 MED ORDER — HYDRALAZINE HCL 20 MG/ML IJ SOLN
INTRAMUSCULAR | Status: AC
  Filled 2014-07-27: qty 1

## 2014-07-27 MED ORDER — LIDOCAINE HCL (PF) 1 % IJ SOLN
INTRAMUSCULAR | Status: AC
  Filled 2014-07-27: qty 30

## 2014-07-27 MED ORDER — HEPARIN SODIUM (PORCINE) 1000 UNIT/ML IJ SOLN
INTRAMUSCULAR | Status: AC
  Filled 2014-07-27: qty 1

## 2014-07-27 MED ORDER — HEPARIN (PORCINE) IN NACL 2-0.9 UNIT/ML-% IJ SOLN
INTRAMUSCULAR | Status: AC
  Filled 2014-07-27: qty 500

## 2014-07-27 NOTE — Interval H&P Note (Signed)
History and Physical Interval Note:  07/27/2014 9:05 AM  Wayne [name]  has presented today for surgery, with the diagnosis of end stage renal  The various methods of treatment have been discussed with the patient and family. After consideration of risks, benefits and other options for treatment, the patient has consented to  Procedure(s): FISTULOGRAM (N/A) as a surgical intervention .  The patient's history has been reviewed, patient examined, no change in status, stable for surgery.  I have reviewed the patient's chart and labs.  Questions were answered to the patient's satisfaction.     DICKSON,CHRISTOPHER S

## 2014-07-27 NOTE — Op Note (Signed)
   PATIENT: Wayne [name]   MRN: [id number] DOB: 1951-01-22    DATE OF PROCEDURE: 07/27/2014  INDICATIONS: Wayne [name] is a 64 y.o. male with a poorly functioning left brachiocephalic AV fistula  PROCEDURE:  1. Ultrasound-guided access to left brachiocephalic AV fistula 2. Discogram left brachiocephalic AV fistula 3. Venoplasty 2 left brachiocephalic AV fistula  SURGEON: Judeth Cornfield. Scot Dock, MD, FACS  ANESTHESIA: local with sedation   EBL: minimal  TECHNIQUE:The patient was taken to the peripheral vascular lab and the left upper extremity was prepped and draped in the usual sterile fashion. Under ultrasound guidance, after the skin was anesthetized, the proximal fistula was cannulated with a micropuncture needle and a micropuncture sheath introduced over a wire. Fistula gram was obtained evaluating the fistula from the cannulation point to the central veins. There was no central venous stenosis. There was a long segment of narrowing in the cephalic vein right before it entered the subclavian vein. I elected to address this with ballooning angioplasty. I exchanged the micropuncture sheath for a 6 French sheath over a Benson wire. The patient was then heparinized. I initially selected a 4 mm x 6 cm balloon which was positioned across the stenosis in the central cephalic vein. This was inflated to 24 atm for 1 minute. Completion study showed some residual stenosis. I went back within 6 mm x 6 cm balloon which was inflated to 24 atm for 1 minute. There remained some residual stenosis. I therefore tried a 6 mm x 4 cm chocolate balloon. This was inflated to 10 atm for 1 minute. There was minimal residual stenosis. With the balloon inflated also did a retrograde shot to evaluate the proximal anastomosis. There was a very tight stenosis right at the proximal anastomosis.  I close the cannulation site with a 4-0 Monocryl. Pressure was held for hemostasis. I then cannulated the fistula in a retrograde  fashion and under ultrasound guidance after the skin was anesthetized. The wire was directed to the arterial anastomosis. The micropuncture sheath was exchanged for a 6 French sheath. I went back with the 4 mm x 6 cm balloon and this was inflated to 24 atm for 1 minute. There was some residual stenosis but there was no waist with inflation of the balloon and therefore I did not elect to proceed with a more aggressive approach. As an excellent thrill in the fistula at this point. A cannulation site was closed with 4-0 Monocryl suture.  FINDINGS:  1. 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 2. Critical narrowing at proximal anastomosis which was addressed with balloon angioplasty with mild residual stenosis. 3. No other significant problems with the fistula identified.  CLINICAL NOTE: The fistula can be used for access at this point.  Deitra Mayo, MD, FACS [facility]  DATE OF DICTATION:   07/27/2014

## 2014-07-27 NOTE — Discharge Instructions (Signed)
Fistulogram, Care After °Refer to this sheet in the next few weeks. These instructions provide you with information on caring for yourself after your procedure. Your health care provider may also give you more specific instructions. Your treatment has been planned according to current medical practices, but problems sometimes occur. Call your health care provider if you have any problems or questions after your procedure. °WHAT TO EXPECT AFTER THE PROCEDURE °After your procedure, it is typical to have the following: °· A small amount of discomfort in the area where the catheters were placed. °· A small amount of bruising around the fistula. °· Sleepiness and fatigue. °HOME CARE INSTRUCTIONS °· Rest at home for the day following your procedure. °· Do not drive or operate heavy machinery while taking pain medicine. °· Take medicines only as directed by your health care provider. °· Do not take baths, swim, or use a hot tub until your health care provider approves. You may shower 24 hours after the procedure or as directed by your health care provider. °· There are many different ways to close and cover an incision, including stitches, skin glue, and adhesive strips. Follow your health care provider's instructions on: °¨ Incision care. °¨ Bandage (dressing) changes and removal. °¨ Incision closure removal. °· Monitor your dialysis fistula carefully. °SEEK MEDICAL CARE IF: °· You have drainage, redness, swelling, or pain at your catheter site. °· You have a fever. °· You have chills. °SEEK IMMEDIATE MEDICAL CARE IF: °· You feel weak. °· You have trouble balancing. °· You have trouble moving your arms or legs. °· You have problems with your speech or vision. °· You can no longer feel a vibration or buzz when you put your fingers over your dialysis fistula. °· The limb that was used for the procedure: °¨ Swells. °¨ Is painful. °¨ Is cold. °¨ Is discolored, such as blue or pale white. °Document Released: 08/18/2013  Document Reviewed: 05/23/2013 °ExitCare® Patient Information ©2015 [employer]. This information is not intended to replace advice given to you by your health care provider. Make sure you discuss any questions you have with your health care provider. ° °

## 2014-07-27 NOTE — H&P (View-Only) (Signed)
HISTORY AND PHYSICAL     CC:  Evaluate fistula Referring Provider:  Rosita Fire, MD  HPI: This is a 64 y.o. male who underwent a left brachiocephalic AVF on 7/34/19.  At that time, he was not yet on HD.  He is now on HD T/T/S.  He states that recently, they began having trouble sticking the fistula.  He states that depending on who is cannulating the fistula depends on if they have trouble.  The more proximal the fistula is stuck, the harder it is to run the dialysis machine.  He is referred for evaluation of his fistula.    He is on a statin for his cholesterol.  He is on insulin for his DM.  He also takes a daily baby aspirin.  Past Medical History  Diagnosis Date  . Type 2 diabetes mellitus   . Essential hypertension   . Anemia of chronic disease   . Peripheral vascular disease   . Sarcoidosis   . ESRD (end stage renal disease)     Hemodialysis since December 2015    Past Surgical History  Procedure Laterality Date  . Below knee leg amputation Left   . 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 Esophageal dilation      8-10 yrs ago   . Eye surgery Right   . Av fistula placement Left 12/31/2012    Procedure: ARTERIOVENOUS (AV) FISTULA CREATION-LEFT;  Surgeon: Angelia Mould, MD;  Location: [place];  Service: Vascular;  Laterality: Left;    No Known Allergies  Current Outpatient Prescriptions  Medication Sig Dispense Refill  . aspirin EC 81 MG tablet Take 81 mg by mouth daily.    Marland Kitchen atorvastatin (LIPITOR) 40 MG tablet Take 40 mg by mouth daily.    . calcitRIOL (ROCALTROL) 0.25 MCG capsule Take 0.25 mcg by mouth daily.    . cloNIDine (CATAPRES) 0.2 MG tablet Take 1 tablet by mouth 2 (two) times daily.     . ferrous sulfate 325 (65 FE) MG tablet Take 325 mg by mouth daily with breakfast.    . insulin glargine (LANTUS) 100 UNIT/ML injection Inject 10-20 Units into the skin at bedtime. IF CBG>120=20 units    . Multiple  Vitamin (MULTIVITAMIN WITH MINERALS) TABS tablet Take 1 tablet by mouth daily.    Marland Kitchen NIFEdipine (PROCARDIA-XL/ADALAT CC) 60 MG 24 hr tablet Take 60 mg by mouth daily.    Marland Kitchen oxyCODONE (ROXICODONE) 5 MG immediate release tablet Take 1 tablet (5 mg total) by mouth every 6 (six) hours as needed for pain. 30 tablet 0  . sitaGLIPtin (JANUVIA) 100 MG tablet Take 100 mg by mouth daily.     No current facility-administered medications for this visit.    Family History  Problem Relation Age of Onset  . Diabetes Mellitus II Mother     History   Social History  . Marital Status: Married    Spouse Name: N/A  . Number of Children: N/A  . Years of Education: N/A   Occupational History  . Not on file.   Social History Main Topics  . Smoking status: Former Smoker    Types: Cigarettes    Quit date: 04/17/1998  . Smokeless tobacco: Not on file  . Alcohol Use: 0.0 oz/week    0 Standard drinks or equivalent per week     Comment: Socially  . Drug Use: No  . Sexual Activity: Not on file  Other Topics Concern  . Not on file   Social History Narrative     ROS: [x]  Positive   [ ]  Negative   [ ]  All sytems reviewed and are negative  Cardiovascular: []  chest pain/pressure []  palpitations []  SOB lying flat []  DOE []  pain in legs while walking []  pain in feet when lying flat []  hx of DVT []  hx of phlebitis []  swelling in legs []  varicose veins  Pulmonary: []  productive cough []  asthma []  wheezing  Neurologic: []  weakness in []  arms []  legs []  numbness in []  arms []  legs [] difficulty speaking or slurred speech [x]  loss of vision-legally blind []  dizziness  Hematologic: []  bleeding problems []  problems with blood clotting easily  GI []  vomiting blood []  blood in stool  GU: []  burning with urination []  blood in urine  Psychiatric: []  hx of major depression  Integumentary: []  rashes []  ulcers  Constitutional: []  fever []  chills   PHYSICAL EXAMINATION:  Vital  signs: Filed Vitals:   07/15/14 1454  Height: 6' (1.829 m)  Weight: 161 lb (73.029 kg)     General:  WDWN in NAD Gait: Not observed HENT: WNL, normocephalic Pulmonary: normal non-labored breathing  Cardiac: RRR Abdomen: soft, NT, no masses Skin: with rashes, with ulcers  Vascular Exam/Pulses: Pt has 2+ left radial pulse There is a good thrill within the fistula distally at the anastomosis, but becomes more pulsatile proximall Extremities: without ischemic changes, without Gangrene , without cellulitis; without open wounds;  Musculoskeletal: no muscle wasting or atrophy  Neurologic: A&O X 3; Appropriate Affect ; SENSATION: normal; MOTOR FUNCTION:  moving all extremities equally. Speech is fluent/normal   Non-Invasive Vascular Imaging:   There is a narrowing in diameter in the mid/proximal upper arm fistula.  Pt meds includes: Statin:  Yes.   Beta Blocker:  No. Aspirin:  Yes.   ACEI:  No. ARB:  No. Other Antiplatelet/Anticoagulant:  No.    ASSESSMENT/PLAN:: 64 y.o. male who had a left brachiocephalic AVF placed 08/2839 who has not had any issues with his fistula until recently.   -difficulty cannulating the fistula at times.  He does have a narrowing in the proximal portion of the fistula. -will schedule him for a fistulogram on July 27, 2014 with possible intervention. -questions answered by Dr. Scot Dock.   Leontine Locket, PA-C [facility] [phone]  Clinic MD:  Pt seen and examined in conjunction with Dr. Scot Dock  Agree with above. The fistula is pulsatile and therefore I have recommended that we proceed with a fistulogram. This has been scheduled for 07/27/2014. We have discussed the procedure potential complications and he is agreeable to proceed.  Deitra Mayo, MD, [place] [phone] Office: [phone]

## 2014-07-28 DIAGNOSIS — Z794 Long term (current) use of insulin: Secondary | ICD-10-CM | POA: Diagnosis not present

## 2014-07-28 DIAGNOSIS — D509 Iron deficiency anemia, unspecified: Secondary | ICD-10-CM | POA: Diagnosis not present

## 2014-07-28 DIAGNOSIS — Z992 Dependence on renal dialysis: Secondary | ICD-10-CM | POA: Diagnosis not present

## 2014-07-28 DIAGNOSIS — N2581 Secondary hyperparathyroidism of renal origin: Secondary | ICD-10-CM | POA: Diagnosis not present

## 2014-07-28 DIAGNOSIS — N186 End stage renal disease: Secondary | ICD-10-CM | POA: Diagnosis not present

## 2014-07-28 DIAGNOSIS — D631 Anemia in chronic kidney disease: Secondary | ICD-10-CM | POA: Diagnosis not present

## 2014-07-28 DIAGNOSIS — E119 Type 2 diabetes mellitus without complications: Secondary | ICD-10-CM | POA: Diagnosis not present

## 2014-07-30 ENCOUNTER — Ambulatory Visit: Payer: Self-pay | Admitting: Cardiovascular Disease

## 2014-07-30 DIAGNOSIS — D631 Anemia in chronic kidney disease: Secondary | ICD-10-CM | POA: Diagnosis not present

## 2014-07-30 DIAGNOSIS — N2581 Secondary hyperparathyroidism of renal origin: Secondary | ICD-10-CM | POA: Diagnosis not present

## 2014-07-30 DIAGNOSIS — Z992 Dependence on renal dialysis: Secondary | ICD-10-CM | POA: Diagnosis not present

## 2014-07-30 DIAGNOSIS — D509 Iron deficiency anemia, unspecified: Secondary | ICD-10-CM | POA: Diagnosis not present

## 2014-07-30 DIAGNOSIS — N186 End stage renal disease: Secondary | ICD-10-CM | POA: Diagnosis not present

## 2014-08-01 DIAGNOSIS — N2581 Secondary hyperparathyroidism of renal origin: Secondary | ICD-10-CM | POA: Diagnosis not present

## 2014-08-01 DIAGNOSIS — D509 Iron deficiency anemia, unspecified: Secondary | ICD-10-CM | POA: Diagnosis not present

## 2014-08-01 DIAGNOSIS — N186 End stage renal disease: Secondary | ICD-10-CM | POA: Diagnosis not present

## 2014-08-01 DIAGNOSIS — Z992 Dependence on renal dialysis: Secondary | ICD-10-CM | POA: Diagnosis not present

## 2014-08-01 DIAGNOSIS — D631 Anemia in chronic kidney disease: Secondary | ICD-10-CM | POA: Diagnosis not present

## 2014-08-03 DIAGNOSIS — Z992 Dependence on renal dialysis: Secondary | ICD-10-CM | POA: Diagnosis not present

## 2014-08-03 DIAGNOSIS — D509 Iron deficiency anemia, unspecified: Secondary | ICD-10-CM | POA: Diagnosis not present

## 2014-08-03 DIAGNOSIS — N186 End stage renal disease: Secondary | ICD-10-CM | POA: Diagnosis not present

## 2014-08-03 DIAGNOSIS — N2581 Secondary hyperparathyroidism of renal origin: Secondary | ICD-10-CM | POA: Diagnosis not present

## 2014-08-03 DIAGNOSIS — D631 Anemia in chronic kidney disease: Secondary | ICD-10-CM | POA: Diagnosis not present

## 2014-08-05 DIAGNOSIS — N2581 Secondary hyperparathyroidism of renal origin: Secondary | ICD-10-CM | POA: Diagnosis not present

## 2014-08-05 DIAGNOSIS — Z992 Dependence on renal dialysis: Secondary | ICD-10-CM | POA: Diagnosis not present

## 2014-08-05 DIAGNOSIS — D631 Anemia in chronic kidney disease: Secondary | ICD-10-CM | POA: Diagnosis not present

## 2014-08-05 DIAGNOSIS — D509 Iron deficiency anemia, unspecified: Secondary | ICD-10-CM | POA: Diagnosis not present

## 2014-08-05 DIAGNOSIS — N186 End stage renal disease: Secondary | ICD-10-CM | POA: Diagnosis not present

## 2014-08-06 ENCOUNTER — Other Ambulatory Visit (HOSPITAL_COMMUNITY): Payer: Self-pay | Admitting: Internal Medicine

## 2014-08-06 DIAGNOSIS — N186 End stage renal disease: Secondary | ICD-10-CM

## 2014-08-07 DIAGNOSIS — N2581 Secondary hyperparathyroidism of renal origin: Secondary | ICD-10-CM | POA: Diagnosis not present

## 2014-08-07 DIAGNOSIS — Z992 Dependence on renal dialysis: Secondary | ICD-10-CM | POA: Diagnosis not present

## 2014-08-07 DIAGNOSIS — D631 Anemia in chronic kidney disease: Secondary | ICD-10-CM | POA: Diagnosis not present

## 2014-08-07 DIAGNOSIS — D509 Iron deficiency anemia, unspecified: Secondary | ICD-10-CM | POA: Diagnosis not present

## 2014-08-07 DIAGNOSIS — N186 End stage renal disease: Secondary | ICD-10-CM | POA: Diagnosis not present

## 2014-08-10 DIAGNOSIS — N2581 Secondary hyperparathyroidism of renal origin: Secondary | ICD-10-CM | POA: Diagnosis not present

## 2014-08-10 DIAGNOSIS — D631 Anemia in chronic kidney disease: Secondary | ICD-10-CM | POA: Diagnosis not present

## 2014-08-10 DIAGNOSIS — Z992 Dependence on renal dialysis: Secondary | ICD-10-CM | POA: Diagnosis not present

## 2014-08-10 DIAGNOSIS — D509 Iron deficiency anemia, unspecified: Secondary | ICD-10-CM | POA: Diagnosis not present

## 2014-08-10 DIAGNOSIS — N186 End stage renal disease: Secondary | ICD-10-CM | POA: Diagnosis not present

## 2014-08-12 ENCOUNTER — Ambulatory Visit (HOSPITAL_COMMUNITY)
Admission: RE | Admit: 2014-08-12 | Discharge: 2014-08-12 | Disposition: A | Discharge status: Home / Self Care | Payer: Medicare Other | Source: Ambulatory Visit | Attending: Internal Medicine | Admitting: Internal Medicine

## 2014-08-12 DIAGNOSIS — N186 End stage renal disease: Secondary | ICD-10-CM | POA: Diagnosis not present

## 2014-08-12 DIAGNOSIS — Z794 Long term (current) use of insulin: Secondary | ICD-10-CM | POA: Insufficient documentation

## 2014-08-12 DIAGNOSIS — N3289 Other specified disorders of bladder: Secondary | ICD-10-CM | POA: Diagnosis not present

## 2014-08-12 DIAGNOSIS — K5900 Constipation, unspecified: Secondary | ICD-10-CM | POA: Diagnosis not present

## 2014-08-12 DIAGNOSIS — K8020 Calculus of gallbladder without cholecystitis without obstruction: Secondary | ICD-10-CM | POA: Diagnosis not present

## 2014-08-12 DIAGNOSIS — N400 Enlarged prostate without lower urinary tract symptoms: Secondary | ICD-10-CM | POA: Insufficient documentation

## 2014-08-12 DIAGNOSIS — Z992 Dependence on renal dialysis: Secondary | ICD-10-CM | POA: Diagnosis not present

## 2014-08-12 DIAGNOSIS — E119 Type 2 diabetes mellitus without complications: Secondary | ICD-10-CM | POA: Diagnosis not present

## 2014-08-12 DIAGNOSIS — D509 Iron deficiency anemia, unspecified: Secondary | ICD-10-CM | POA: Diagnosis not present

## 2014-08-12 DIAGNOSIS — D631 Anemia in chronic kidney disease: Secondary | ICD-10-CM | POA: Diagnosis not present

## 2014-08-12 DIAGNOSIS — N2581 Secondary hyperparathyroidism of renal origin: Secondary | ICD-10-CM | POA: Diagnosis not present

## 2014-08-14 DIAGNOSIS — D631 Anemia in chronic kidney disease: Secondary | ICD-10-CM | POA: Diagnosis not present

## 2014-08-14 DIAGNOSIS — Z992 Dependence on renal dialysis: Secondary | ICD-10-CM | POA: Diagnosis not present

## 2014-08-14 DIAGNOSIS — N186 End stage renal disease: Secondary | ICD-10-CM | POA: Diagnosis not present

## 2014-08-14 DIAGNOSIS — N2581 Secondary hyperparathyroidism of renal origin: Secondary | ICD-10-CM | POA: Diagnosis not present

## 2014-08-14 DIAGNOSIS — D509 Iron deficiency anemia, unspecified: Secondary | ICD-10-CM | POA: Diagnosis not present

## 2014-08-15 DIAGNOSIS — Z992 Dependence on renal dialysis: Secondary | ICD-10-CM | POA: Diagnosis not present

## 2014-08-15 DIAGNOSIS — N186 End stage renal disease: Secondary | ICD-10-CM | POA: Diagnosis not present

## 2014-08-17 ENCOUNTER — Ambulatory Visit: Payer: Self-pay | Admitting: Cardiovascular Disease

## 2014-08-17 DIAGNOSIS — Z992 Dependence on renal dialysis: Secondary | ICD-10-CM | POA: Diagnosis not present

## 2014-08-17 DIAGNOSIS — N186 End stage renal disease: Secondary | ICD-10-CM | POA: Diagnosis not present

## 2014-08-17 DIAGNOSIS — D509 Iron deficiency anemia, unspecified: Secondary | ICD-10-CM | POA: Diagnosis not present

## 2014-08-17 DIAGNOSIS — D631 Anemia in chronic kidney disease: Secondary | ICD-10-CM | POA: Diagnosis not present

## 2014-08-19 DIAGNOSIS — D631 Anemia in chronic kidney disease: Secondary | ICD-10-CM | POA: Diagnosis not present

## 2014-08-19 DIAGNOSIS — D509 Iron deficiency anemia, unspecified: Secondary | ICD-10-CM | POA: Diagnosis not present

## 2014-08-19 DIAGNOSIS — Z992 Dependence on renal dialysis: Secondary | ICD-10-CM | POA: Diagnosis not present

## 2014-08-19 DIAGNOSIS — N186 End stage renal disease: Secondary | ICD-10-CM | POA: Diagnosis not present

## 2014-08-21 DIAGNOSIS — D631 Anemia in chronic kidney disease: Secondary | ICD-10-CM | POA: Diagnosis not present

## 2014-08-21 DIAGNOSIS — Z992 Dependence on renal dialysis: Secondary | ICD-10-CM | POA: Diagnosis not present

## 2014-08-21 DIAGNOSIS — D509 Iron deficiency anemia, unspecified: Secondary | ICD-10-CM | POA: Diagnosis not present

## 2014-08-21 DIAGNOSIS — N186 End stage renal disease: Secondary | ICD-10-CM | POA: Diagnosis not present

## 2014-08-24 DIAGNOSIS — Z992 Dependence on renal dialysis: Secondary | ICD-10-CM | POA: Diagnosis not present

## 2014-08-24 DIAGNOSIS — D631 Anemia in chronic kidney disease: Secondary | ICD-10-CM | POA: Diagnosis not present

## 2014-08-24 DIAGNOSIS — D509 Iron deficiency anemia, unspecified: Secondary | ICD-10-CM | POA: Diagnosis not present

## 2014-08-24 DIAGNOSIS — N186 End stage renal disease: Secondary | ICD-10-CM | POA: Diagnosis not present

## 2014-08-25 DIAGNOSIS — B351 Tinea unguium: Secondary | ICD-10-CM | POA: Diagnosis not present

## 2014-08-25 DIAGNOSIS — E1342 Other specified diabetes mellitus with diabetic polyneuropathy: Secondary | ICD-10-CM | POA: Diagnosis not present

## 2014-08-26 DIAGNOSIS — N186 End stage renal disease: Secondary | ICD-10-CM | POA: Diagnosis not present

## 2014-08-26 DIAGNOSIS — D509 Iron deficiency anemia, unspecified: Secondary | ICD-10-CM | POA: Diagnosis not present

## 2014-08-26 DIAGNOSIS — Z992 Dependence on renal dialysis: Secondary | ICD-10-CM | POA: Diagnosis not present

## 2014-08-26 DIAGNOSIS — D631 Anemia in chronic kidney disease: Secondary | ICD-10-CM | POA: Diagnosis not present

## 2014-08-28 DIAGNOSIS — D631 Anemia in chronic kidney disease: Secondary | ICD-10-CM | POA: Diagnosis not present

## 2014-08-28 DIAGNOSIS — N186 End stage renal disease: Secondary | ICD-10-CM | POA: Diagnosis not present

## 2014-08-28 DIAGNOSIS — Z992 Dependence on renal dialysis: Secondary | ICD-10-CM | POA: Diagnosis not present

## 2014-08-28 DIAGNOSIS — D509 Iron deficiency anemia, unspecified: Secondary | ICD-10-CM | POA: Diagnosis not present

## 2014-08-31 DIAGNOSIS — Z992 Dependence on renal dialysis: Secondary | ICD-10-CM | POA: Diagnosis not present

## 2014-08-31 DIAGNOSIS — D509 Iron deficiency anemia, unspecified: Secondary | ICD-10-CM | POA: Diagnosis not present

## 2014-08-31 DIAGNOSIS — D631 Anemia in chronic kidney disease: Secondary | ICD-10-CM | POA: Diagnosis not present

## 2014-08-31 DIAGNOSIS — N186 End stage renal disease: Secondary | ICD-10-CM | POA: Diagnosis not present

## 2014-09-02 DIAGNOSIS — Z992 Dependence on renal dialysis: Secondary | ICD-10-CM | POA: Diagnosis not present

## 2014-09-02 DIAGNOSIS — D509 Iron deficiency anemia, unspecified: Secondary | ICD-10-CM | POA: Diagnosis not present

## 2014-09-02 DIAGNOSIS — D631 Anemia in chronic kidney disease: Secondary | ICD-10-CM | POA: Diagnosis not present

## 2014-09-02 DIAGNOSIS — N186 End stage renal disease: Secondary | ICD-10-CM | POA: Diagnosis not present

## 2014-09-04 DIAGNOSIS — D631 Anemia in chronic kidney disease: Secondary | ICD-10-CM | POA: Diagnosis not present

## 2014-09-04 DIAGNOSIS — N186 End stage renal disease: Secondary | ICD-10-CM | POA: Diagnosis not present

## 2014-09-04 DIAGNOSIS — D509 Iron deficiency anemia, unspecified: Secondary | ICD-10-CM | POA: Diagnosis not present

## 2014-09-04 DIAGNOSIS — Z992 Dependence on renal dialysis: Secondary | ICD-10-CM | POA: Diagnosis not present

## 2014-09-07 DIAGNOSIS — D631 Anemia in chronic kidney disease: Secondary | ICD-10-CM | POA: Diagnosis not present

## 2014-09-07 DIAGNOSIS — D509 Iron deficiency anemia, unspecified: Secondary | ICD-10-CM | POA: Diagnosis not present

## 2014-09-07 DIAGNOSIS — Z992 Dependence on renal dialysis: Secondary | ICD-10-CM | POA: Diagnosis not present

## 2014-09-07 DIAGNOSIS — N186 End stage renal disease: Secondary | ICD-10-CM | POA: Diagnosis not present

## 2014-09-08 ENCOUNTER — Encounter: Payer: Self-pay | Admitting: Cardiovascular Disease

## 2014-09-08 ENCOUNTER — Ambulatory Visit (INDEPENDENT_AMBULATORY_CARE_PROVIDER_SITE_OTHER): Payer: Medicare Other | Admitting: Cardiovascular Disease

## 2014-09-08 VITALS — BP 146/78 | HR 80 | Ht 73.0 in | Wt 156.0 lb

## 2014-09-08 DIAGNOSIS — N186 End stage renal disease: Secondary | ICD-10-CM | POA: Diagnosis not present

## 2014-09-08 DIAGNOSIS — R9431 Abnormal electrocardiogram [ECG] [EKG]: Secondary | ICD-10-CM

## 2014-09-08 DIAGNOSIS — I2510 Atherosclerotic heart disease of native coronary artery without angina pectoris: Secondary | ICD-10-CM | POA: Diagnosis not present

## 2014-09-08 DIAGNOSIS — I509 Heart failure, unspecified: Secondary | ICD-10-CM | POA: Diagnosis not present

## 2014-09-08 DIAGNOSIS — R0989 Other specified symptoms and signs involving the circulatory and respiratory systems: Secondary | ICD-10-CM

## 2014-09-08 DIAGNOSIS — I10 Essential (primary) hypertension: Secondary | ICD-10-CM

## 2014-09-08 DIAGNOSIS — Z01810 Encounter for preprocedural cardiovascular examination: Secondary | ICD-10-CM

## 2014-09-08 NOTE — Progress Notes (Signed)
Patient ID: Eugune Sine, male   DOB: 09-01-1950, 64 y.o.   MRN: [id number]       CARDIOLOGY CONSULT NOTE  Patient ID: Olanrewaju Osborn MRN: [id number] DOB/AGE: [birth year] 64 y.o.  Admit date: (Not on file) Primary Physician Rosita Fire, MD  Reason for Consultation: CAD  HPI: The patient is a 64 year old male with a history of end-stage renal disease who is being evaluated for renal transplantation. He is on dialysis. He also has a history of hypertension and hyperlipidemia, as well as diabetes.  ECG dated 07/27/14 demonstrated normal sinus rhythm with a T-wave abnormality suggestive of lateral ischemia.  He denies ever having chest pain. He stepped shortness of breath with pleural effusions prior to undergoing hemodialysis in December 2015, but these symptoms have since resolved. He denies palpitations and syncope. Abdominal and pelvis CT on 08/12/14 demonstrated "extensive atherosclerosis" and "coronary atherosclerosis". 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 He is legally blind.  No Known Allergies  Current Outpatient Prescriptions  Medication Sig Dispense Refill  . acetaminophen (TYLENOL) 500 MG tablet Take 500 mg by mouth every other day.    Marland Kitchen aspirin EC 81 MG tablet Take 81 mg by mouth daily.    Marland Kitchen atorvastatin (LIPITOR) 40 MG tablet Take 40 mg by mouth daily.    . calcitRIOL (ROCALTROL) 0.25 MCG capsule Take 0.25 mcg by mouth daily.    . cloNIDine (CATAPRES) 0.2 MG tablet Take 1 tablet by mouth daily.     . ferrous sulfate 325 (65 FE) MG tablet Take 325 mg by mouth daily with breakfast.    . insulin glargine (LANTUS) 100 UNIT/ML injection Inject 10-20 Units into the skin at bedtime. IF CBG>120=20 units    . sitaGLIPtin (JANUVIA) 100 MG tablet Take 100 mg by mouth daily.     No current facility-administered medications for this visit.    Past Medical History  Diagnosis Date  . Type 2 diabetes mellitus    . Essential hypertension   . Anemia of chronic disease   . Peripheral vascular disease   . Sarcoidosis   . ESRD (end stage renal disease)     Hemodialysis since December 2015    Past Surgical History  Procedure Laterality Date  . Below knee leg amputation Left   . Amputation of replicated toes Right 6301    3rd, 4th, 5th digits (toes) of right foot  . Esophageal dilation      8-10 yrs ago   . Eye surgery Right   . Av fistula placement Left 12/31/2012    Procedure: ARTERIOVENOUS (AV) FISTULA CREATION-LEFT;  Surgeon: Angelia Mould, MD;  Location: [place];  Service: Vascular;  Laterality: Left;  . Fistulogram N/A 07/27/2014    Procedure: FISTULOGRAM;  Surgeon: Angelia Mould, MD;  [facility]: [facility] C[facility];  Service: Cardiovascular;  Laterality: N/A;    History   Social History  . Marital Status: Married    Spouse Name: N/A  . Number of Children: N/A  . Years of Education: N/A   Occupational History  . Not on file.   Social History Main Topics  . Smoking status: Former Smoker    Types: Cigarettes    Quit date: 04/17/1998  . Smokeless tobacco: Not on file  . Alcohol Use: 0.0 oz/week    0 Standard drinks or equivalent per week     Comment: Socially  . Drug Use: No  . Sexual Activity: Not  on file   Other Topics Concern  . Not on file   Social History Narrative     No family history of premature CAD in 1st degree relatives.  Prior to Admission medications   Medication Sig Start Date End Date Taking? Authorizing Provider  acetaminophen (TYLENOL) 500 MG tablet Take 500 mg by mouth every other day.   Yes Historical Provider, MD  aspirin EC 81 MG tablet Take 81 mg by mouth daily.   Yes Historical Provider, MD  atorvastatin (LIPITOR) 40 MG tablet Take 40 mg by mouth daily.   Yes Historical Provider, MD  calcitRIOL (ROCALTROL) 0.25 MCG capsule Take 0.25 mcg by mouth daily.   Yes Historical Provider, MD  cloNIDine (CATAPRES) 0.2 MG tablet Take 1 tablet by mouth  daily.  02/11/13  Yes Historical Provider, MD  ferrous sulfate 325 (65 FE) MG tablet Take 325 mg by mouth daily with breakfast.   Yes Historical Provider, MD  insulin glargine (LANTUS) 100 UNIT/ML injection Inject 10-20 Units into the skin at bedtime. IF CBG>120=20 units   Yes Historical Provider, MD  sitaGLIPtin (JANUVIA) 100 MG tablet Take 100 mg by mouth daily.   Yes Historical Provider, MD     Review of systems complete and found to be negative unless listed above in HPI     Physical exam Blood pressure 146/78, pulse 80, height 6\' 1"  (1.854 m), weight 156 lb (70.761 kg), SpO2 99 %. General: NAD, blind Neck: No JVD, no thyromegaly or thyroid nodule.  Lungs: Diminished but clear to auscultation bilaterally with normal respiratory effort. No rales or wheezes. CV: Nondisplaced PMI. Regular rate and rhythm, normal S1/S2, no S3/S4, no murmur.  No peripheral edema in right leg. +left leg prosthesis.  +left carotid bruit.   Abdomen: Soft, nontender, no distention.  Skin: Intact without lesions or rashes.  Neurologic: Alert and oriented x 3.  Psych: Normal affect. Extremities: No clubbing or cyanosis.  HEENT: Normal.   ECG: Most recent ECG reviewed.  Labs:   Lab Results  Component Value Date   WBC 9.3 06/28/2007   HGB 12.9* 07/27/2014   HCT 38.0* 07/27/2014   MCV 86.7 06/28/2007   PLT 224 06/28/2007   No results for input(s): NA, K, CL, CO2, BUN, CREATININE, CALCIUM, PROT, BILITOT, ALKPHOS, ALT, AST, GLUCOSE in the last 168 hours.  Invalid input(s): LABALBU No results found for: CKTOTAL, CKMB, CKMBINDEX, TROPONINI No results found for: CHOL No results found for: HDL No results found for: LDLCALC No results found for: TRIG No results found for: CHOLHDL No results found for: LDLDIRECT       Studies: No results found.  ASSESSMENT AND PLAN:  1. Coronary atherosclerosis on CT with abnormal ECG: As he is planning to undergo renal transplantation with a prior h/o possible  CHF, will obtain an echocardiogram to evaluate left ventricular function as well as a Lexiscan Cardiolite stress test to evaluate for ischemic heart disease. Continue aspirin 81 mg and Lipitor 40 mg.  2. Left carotid bruit: High likelihood for peripheral vascular disease given his comorbidities. Will obtain carotid Dopplers.  3. Essential HTN: Mildly elevated but fluctuates quite a bit. Will monitor.  Dispo: f/u 2 months.   Signed: Kate Sable, M.D., F.A.C.C.  09/08/2014, 1:08 PM

## 2014-09-08 NOTE — Addendum Note (Signed)
Addended by: Barbarann Ehlers A on: 09/08/2014 01:34 PM   Modules accepted: Orders, Level of Service

## 2014-09-08 NOTE — Patient Instructions (Signed)
Your physician recommends that you schedule a follow-up appointment in: 2 months with Dr Bronson Ing   Your physician recommends that you continue on your current medications as directed. Please refer to the Current Medication list given to you today.    Your physician has requested that you have an echocardiogram. Echocardiography is a painless test that uses sound waves to create images of your heart. It provides your doctor with information about the size and shape of your heart and how well your heart's chambers and valves are working. This procedure takes approximately one hour. There are no restrictions for this procedure.  Your physician has requested that you have a carotid duplex. This test is an ultrasound of the carotid arteries in your neck. It looks at blood flow through these arteries that supply the brain with blood. Allow one hour for this exam. There are no restrictions or special instructions.   Your physician has requested that you have a lexiscan myoview. For further information please visit [email or web address]. Please follow instruction sheet, as given.    Thank you for choosing [facility] !

## 2014-09-09 DIAGNOSIS — Z992 Dependence on renal dialysis: Secondary | ICD-10-CM | POA: Diagnosis not present

## 2014-09-09 DIAGNOSIS — N186 End stage renal disease: Secondary | ICD-10-CM | POA: Diagnosis not present

## 2014-09-09 DIAGNOSIS — D509 Iron deficiency anemia, unspecified: Secondary | ICD-10-CM | POA: Diagnosis not present

## 2014-09-09 DIAGNOSIS — D631 Anemia in chronic kidney disease: Secondary | ICD-10-CM | POA: Diagnosis not present

## 2014-09-10 DIAGNOSIS — E1165 Type 2 diabetes mellitus with hyperglycemia: Secondary | ICD-10-CM | POA: Diagnosis not present

## 2014-09-10 DIAGNOSIS — N186 End stage renal disease: Secondary | ICD-10-CM | POA: Diagnosis not present

## 2014-09-10 DIAGNOSIS — Z89512 Acquired absence of left leg below knee: Secondary | ICD-10-CM | POA: Diagnosis not present

## 2014-09-10 DIAGNOSIS — I10 Essential (primary) hypertension: Secondary | ICD-10-CM | POA: Diagnosis not present

## 2014-09-11 DIAGNOSIS — D631 Anemia in chronic kidney disease: Secondary | ICD-10-CM | POA: Diagnosis not present

## 2014-09-11 DIAGNOSIS — D509 Iron deficiency anemia, unspecified: Secondary | ICD-10-CM | POA: Diagnosis not present

## 2014-09-11 DIAGNOSIS — Z992 Dependence on renal dialysis: Secondary | ICD-10-CM | POA: Diagnosis not present

## 2014-09-11 DIAGNOSIS — N186 End stage renal disease: Secondary | ICD-10-CM | POA: Diagnosis not present

## 2014-09-14 DIAGNOSIS — Z992 Dependence on renal dialysis: Secondary | ICD-10-CM | POA: Diagnosis not present

## 2014-09-14 DIAGNOSIS — N186 End stage renal disease: Secondary | ICD-10-CM | POA: Diagnosis not present

## 2014-09-14 DIAGNOSIS — D631 Anemia in chronic kidney disease: Secondary | ICD-10-CM | POA: Diagnosis not present

## 2014-09-14 DIAGNOSIS — D509 Iron deficiency anemia, unspecified: Secondary | ICD-10-CM | POA: Diagnosis not present

## 2014-09-15 DIAGNOSIS — Z992 Dependence on renal dialysis: Secondary | ICD-10-CM | POA: Diagnosis not present

## 2014-09-15 DIAGNOSIS — N186 End stage renal disease: Secondary | ICD-10-CM | POA: Diagnosis not present

## 2014-09-16 DIAGNOSIS — N186 End stage renal disease: Secondary | ICD-10-CM | POA: Diagnosis not present

## 2014-09-16 DIAGNOSIS — Z992 Dependence on renal dialysis: Secondary | ICD-10-CM | POA: Diagnosis not present

## 2014-09-16 DIAGNOSIS — D631 Anemia in chronic kidney disease: Secondary | ICD-10-CM | POA: Diagnosis not present

## 2014-09-16 DIAGNOSIS — D509 Iron deficiency anemia, unspecified: Secondary | ICD-10-CM | POA: Diagnosis not present

## 2014-09-17 ENCOUNTER — Encounter (HOSPITAL_COMMUNITY)
Admission: RE | Admit: 2014-09-17 | Discharge: 2014-09-17 | Disposition: A | Discharge status: Home / Self Care | Payer: Medicare Other | Source: Ambulatory Visit | Attending: Cardiovascular Disease | Admitting: Cardiovascular Disease

## 2014-09-17 ENCOUNTER — Encounter (HOSPITAL_COMMUNITY): Payer: Self-pay

## 2014-09-17 ENCOUNTER — Ambulatory Visit (HOSPITAL_COMMUNITY)
Admission: RE | Admit: 2014-09-17 | Discharge: 2014-09-17 | Disposition: A | Discharge status: Home / Self Care | Payer: Medicare Other | Source: Ambulatory Visit | Attending: Cardiovascular Disease | Admitting: Cardiovascular Disease

## 2014-09-17 ENCOUNTER — Inpatient Hospital Stay (HOSPITAL_COMMUNITY)
Admission: RE | Admit: 2014-09-17 | Discharge: 2014-09-17 | Disposition: A | Discharge status: Home / Self Care | Payer: Medicare Other | Source: Ambulatory Visit

## 2014-09-17 DIAGNOSIS — Z01810 Encounter for preprocedural cardiovascular examination: Secondary | ICD-10-CM | POA: Insufficient documentation

## 2014-09-17 DIAGNOSIS — I10 Essential (primary) hypertension: Secondary | ICD-10-CM | POA: Diagnosis not present

## 2014-09-17 DIAGNOSIS — I509 Heart failure, unspecified: Secondary | ICD-10-CM | POA: Diagnosis not present

## 2014-09-17 DIAGNOSIS — I6522 Occlusion and stenosis of left carotid artery: Secondary | ICD-10-CM | POA: Diagnosis not present

## 2014-09-17 DIAGNOSIS — R9431 Abnormal electrocardiogram [ECG] [EKG]: Secondary | ICD-10-CM | POA: Insufficient documentation

## 2014-09-17 DIAGNOSIS — I2510 Atherosclerotic heart disease of native coronary artery without angina pectoris: Secondary | ICD-10-CM | POA: Diagnosis not present

## 2014-09-17 DIAGNOSIS — R0989 Other specified symptoms and signs involving the circulatory and respiratory systems: Secondary | ICD-10-CM

## 2014-09-17 LAB — NM MYOCAR MULTI W/SPECT W/WALL MOTION / EF
CHL CUP NUCLEAR SRS: 6
CSEPPHR: 86 {beats}/min
LHR: 0.1
LV dias vol: 83 mL
LVSYSVOL: 28 mL
Nuc Stress EF: 66 %
Rest HR: 63 {beats}/min
SDS: 3
SSS: 7
TID: 0.92

## 2014-09-17 MED ORDER — TECHNETIUM TC 99M SESTAMIBI - CARDIOLITE
10.0000 | Freq: Once | INTRAVENOUS | Status: AC | PRN
  Administered 2014-09-17: 9 via INTRAVENOUS

## 2014-09-17 MED ORDER — REGADENOSON 0.4 MG/5ML IV SOLN
INTRAVENOUS | Status: AC
  Administered 2014-09-17: 0.4 mg via INTRAVENOUS
  Filled 2014-09-17: qty 5

## 2014-09-17 MED ORDER — TECHNETIUM TC 99M SESTAMIBI GENERIC - CARDIOLITE
30.0000 | Freq: Once | INTRAVENOUS | Status: AC | PRN
  Administered 2014-09-17: 30 via INTRAVENOUS

## 2014-09-17 MED ORDER — SODIUM CHLORIDE 0.9 % IJ SOLN
INTRAMUSCULAR | Status: AC
  Filled 2014-09-17: qty 3

## 2014-09-17 MED ORDER — REGADENOSON 0.4 MG/5ML IV SOLN
0.4000 mg | Freq: Once | INTRAVENOUS | Status: AC | PRN
  Administered 2014-09-17: 0.4 mg via INTRAVENOUS
  Filled 2014-09-17: qty 5

## 2014-09-17 MED ORDER — SODIUM CHLORIDE 0.9 % IJ SOLN
INTRAMUSCULAR | Status: AC
  Administered 2014-09-17: 10 mL via INTRAVENOUS
  Filled 2014-09-17: qty 36

## 2014-09-17 MED ORDER — SODIUM CHLORIDE 0.9 % IJ SOLN
10.0000 mL | INTRAMUSCULAR | Status: DC | PRN
  Administered 2014-09-17: 10 mL via INTRAVENOUS
  Filled 2014-09-17: qty 10

## 2014-09-18 DIAGNOSIS — D509 Iron deficiency anemia, unspecified: Secondary | ICD-10-CM | POA: Diagnosis not present

## 2014-09-18 DIAGNOSIS — D631 Anemia in chronic kidney disease: Secondary | ICD-10-CM | POA: Diagnosis not present

## 2014-09-18 DIAGNOSIS — N186 End stage renal disease: Secondary | ICD-10-CM | POA: Diagnosis not present

## 2014-09-18 DIAGNOSIS — Z992 Dependence on renal dialysis: Secondary | ICD-10-CM | POA: Diagnosis not present

## 2014-09-21 DIAGNOSIS — Z992 Dependence on renal dialysis: Secondary | ICD-10-CM | POA: Diagnosis not present

## 2014-09-21 DIAGNOSIS — D509 Iron deficiency anemia, unspecified: Secondary | ICD-10-CM | POA: Diagnosis not present

## 2014-09-21 DIAGNOSIS — N186 End stage renal disease: Secondary | ICD-10-CM | POA: Diagnosis not present

## 2014-09-21 DIAGNOSIS — D631 Anemia in chronic kidney disease: Secondary | ICD-10-CM | POA: Diagnosis not present

## 2014-09-23 DIAGNOSIS — D509 Iron deficiency anemia, unspecified: Secondary | ICD-10-CM | POA: Diagnosis not present

## 2014-09-23 DIAGNOSIS — D631 Anemia in chronic kidney disease: Secondary | ICD-10-CM | POA: Diagnosis not present

## 2014-09-23 DIAGNOSIS — Z992 Dependence on renal dialysis: Secondary | ICD-10-CM | POA: Diagnosis not present

## 2014-09-23 DIAGNOSIS — N186 End stage renal disease: Secondary | ICD-10-CM | POA: Diagnosis not present

## 2014-09-25 DIAGNOSIS — Z992 Dependence on renal dialysis: Secondary | ICD-10-CM | POA: Diagnosis not present

## 2014-09-25 DIAGNOSIS — D631 Anemia in chronic kidney disease: Secondary | ICD-10-CM | POA: Diagnosis not present

## 2014-09-25 DIAGNOSIS — N186 End stage renal disease: Secondary | ICD-10-CM | POA: Diagnosis not present

## 2014-09-25 DIAGNOSIS — D509 Iron deficiency anemia, unspecified: Secondary | ICD-10-CM | POA: Diagnosis not present

## 2014-09-28 DIAGNOSIS — D509 Iron deficiency anemia, unspecified: Secondary | ICD-10-CM | POA: Diagnosis not present

## 2014-09-28 DIAGNOSIS — Z992 Dependence on renal dialysis: Secondary | ICD-10-CM | POA: Diagnosis not present

## 2014-09-28 DIAGNOSIS — D631 Anemia in chronic kidney disease: Secondary | ICD-10-CM | POA: Diagnosis not present

## 2014-09-28 DIAGNOSIS — N186 End stage renal disease: Secondary | ICD-10-CM | POA: Diagnosis not present

## 2014-09-30 DIAGNOSIS — Z992 Dependence on renal dialysis: Secondary | ICD-10-CM | POA: Diagnosis not present

## 2014-09-30 DIAGNOSIS — I871 Compression of vein: Secondary | ICD-10-CM | POA: Diagnosis not present

## 2014-09-30 DIAGNOSIS — N186 End stage renal disease: Secondary | ICD-10-CM | POA: Diagnosis not present

## 2014-09-30 DIAGNOSIS — T82858A Stenosis of vascular prosthetic devices, implants and grafts, initial encounter: Secondary | ICD-10-CM | POA: Diagnosis not present

## 2014-10-01 ENCOUNTER — Other Ambulatory Visit: Payer: Self-pay | Admitting: *Deleted

## 2014-10-01 DIAGNOSIS — N186 End stage renal disease: Secondary | ICD-10-CM

## 2014-10-01 DIAGNOSIS — T82511S Breakdown (mechanical) of surgically created arteriovenous shunt, sequela: Secondary | ICD-10-CM

## 2014-10-02 DIAGNOSIS — D509 Iron deficiency anemia, unspecified: Secondary | ICD-10-CM | POA: Diagnosis not present

## 2014-10-02 DIAGNOSIS — Z992 Dependence on renal dialysis: Secondary | ICD-10-CM | POA: Diagnosis not present

## 2014-10-02 DIAGNOSIS — D631 Anemia in chronic kidney disease: Secondary | ICD-10-CM | POA: Diagnosis not present

## 2014-10-02 DIAGNOSIS — N186 End stage renal disease: Secondary | ICD-10-CM | POA: Diagnosis not present

## 2014-10-05 ENCOUNTER — Other Ambulatory Visit: Payer: Self-pay

## 2014-10-05 DIAGNOSIS — N186 End stage renal disease: Secondary | ICD-10-CM | POA: Diagnosis not present

## 2014-10-05 DIAGNOSIS — D509 Iron deficiency anemia, unspecified: Secondary | ICD-10-CM | POA: Diagnosis not present

## 2014-10-05 DIAGNOSIS — T82511A Breakdown (mechanical) of surgically created arteriovenous shunt, initial encounter: Secondary | ICD-10-CM

## 2014-10-05 DIAGNOSIS — D631 Anemia in chronic kidney disease: Secondary | ICD-10-CM | POA: Diagnosis not present

## 2014-10-05 DIAGNOSIS — Z992 Dependence on renal dialysis: Secondary | ICD-10-CM | POA: Diagnosis not present

## 2014-10-05 DIAGNOSIS — Z01810 Encounter for preprocedural cardiovascular examination: Secondary | ICD-10-CM

## 2014-10-07 DIAGNOSIS — D631 Anemia in chronic kidney disease: Secondary | ICD-10-CM | POA: Diagnosis not present

## 2014-10-07 DIAGNOSIS — D509 Iron deficiency anemia, unspecified: Secondary | ICD-10-CM | POA: Diagnosis not present

## 2014-10-07 DIAGNOSIS — Z992 Dependence on renal dialysis: Secondary | ICD-10-CM | POA: Diagnosis not present

## 2014-10-07 DIAGNOSIS — N186 End stage renal disease: Secondary | ICD-10-CM | POA: Diagnosis not present

## 2014-10-09 DIAGNOSIS — Z992 Dependence on renal dialysis: Secondary | ICD-10-CM | POA: Diagnosis not present

## 2014-10-09 DIAGNOSIS — N186 End stage renal disease: Secondary | ICD-10-CM | POA: Diagnosis not present

## 2014-10-09 DIAGNOSIS — D631 Anemia in chronic kidney disease: Secondary | ICD-10-CM | POA: Diagnosis not present

## 2014-10-09 DIAGNOSIS — D509 Iron deficiency anemia, unspecified: Secondary | ICD-10-CM | POA: Diagnosis not present

## 2014-10-12 DIAGNOSIS — Z992 Dependence on renal dialysis: Secondary | ICD-10-CM | POA: Diagnosis not present

## 2014-10-12 DIAGNOSIS — D631 Anemia in chronic kidney disease: Secondary | ICD-10-CM | POA: Diagnosis not present

## 2014-10-12 DIAGNOSIS — N186 End stage renal disease: Secondary | ICD-10-CM | POA: Diagnosis not present

## 2014-10-12 DIAGNOSIS — D509 Iron deficiency anemia, unspecified: Secondary | ICD-10-CM | POA: Diagnosis not present

## 2014-10-14 ENCOUNTER — Encounter: Payer: Self-pay | Admitting: Vascular Surgery

## 2014-10-14 DIAGNOSIS — N186 End stage renal disease: Secondary | ICD-10-CM | POA: Diagnosis not present

## 2014-10-14 DIAGNOSIS — D631 Anemia in chronic kidney disease: Secondary | ICD-10-CM | POA: Diagnosis not present

## 2014-10-14 DIAGNOSIS — D509 Iron deficiency anemia, unspecified: Secondary | ICD-10-CM | POA: Diagnosis not present

## 2014-10-14 DIAGNOSIS — Z992 Dependence on renal dialysis: Secondary | ICD-10-CM | POA: Diagnosis not present

## 2014-10-15 DIAGNOSIS — Z992 Dependence on renal dialysis: Secondary | ICD-10-CM | POA: Diagnosis not present

## 2014-10-15 DIAGNOSIS — N186 End stage renal disease: Secondary | ICD-10-CM | POA: Diagnosis not present

## 2014-10-16 ENCOUNTER — Ambulatory Visit (HOSPITAL_COMMUNITY)
Admission: RE | Admit: 2014-10-16 | Discharge: 2014-10-16 | Disposition: A | Discharge status: Home / Self Care | Payer: Medicare Other | Source: Ambulatory Visit | Attending: Vascular Surgery | Admitting: Vascular Surgery

## 2014-10-16 ENCOUNTER — Ambulatory Visit (INDEPENDENT_AMBULATORY_CARE_PROVIDER_SITE_OTHER): Payer: Medicare Other | Admitting: Vascular Surgery

## 2014-10-16 ENCOUNTER — Ambulatory Visit (INDEPENDENT_AMBULATORY_CARE_PROVIDER_SITE_OTHER)
Admission: RE | Admit: 2014-10-16 | Discharge: 2014-10-16 | Disposition: A | Discharge status: Home / Self Care | Payer: Medicare Other | Source: Ambulatory Visit | Attending: Vascular Surgery | Admitting: Vascular Surgery

## 2014-10-16 ENCOUNTER — Encounter: Payer: Self-pay | Admitting: Vascular Surgery

## 2014-10-16 VITALS — BP 150/80 | HR 87 | Resp 16 | Ht 72.0 in | Wt 156.0 lb

## 2014-10-16 DIAGNOSIS — D631 Anemia in chronic kidney disease: Secondary | ICD-10-CM | POA: Diagnosis not present

## 2014-10-16 DIAGNOSIS — T82511S Breakdown (mechanical) of surgically created arteriovenous shunt, sequela: Secondary | ICD-10-CM | POA: Diagnosis not present

## 2014-10-16 DIAGNOSIS — Z01818 Encounter for other preprocedural examination: Secondary | ICD-10-CM | POA: Diagnosis not present

## 2014-10-16 DIAGNOSIS — I2510 Atherosclerotic heart disease of native coronary artery without angina pectoris: Secondary | ICD-10-CM | POA: Diagnosis not present

## 2014-10-16 DIAGNOSIS — N186 End stage renal disease: Secondary | ICD-10-CM | POA: Diagnosis not present

## 2014-10-16 DIAGNOSIS — Z992 Dependence on renal dialysis: Secondary | ICD-10-CM | POA: Diagnosis not present

## 2014-10-16 DIAGNOSIS — D509 Iron deficiency anemia, unspecified: Secondary | ICD-10-CM | POA: Diagnosis not present

## 2014-10-16 NOTE — Progress Notes (Signed)
[facility] [place]  Patient name: Wayne [name] MRN: [id number] DOB: 06-Oct-1950 Sex: male  REASON FOR VISIT: evaluate for new hemodialysis access. Referred by Dr. Otelia Santee.  HPI: Wayne [name] is a 64 y.o. male who had a left brachiocephalic AV fistula and I performed a venoplasty on this on 07/27/2014. Patient subsequently had attempted declot of the fistula by Dr. Augustin Coupe. This was not successful and the patient is referred for evaluation for new access.  I did review the records that were sent from [facility]. Attempted thrombolysis was not successful and a catheter was placed. This was on 09/30/14.  He has no pain or paresthesias in the left upper extremity.   Past Medical History  Diagnosis Date  . Type 2 diabetes mellitus   . Essential hypertension   . Anemia of chronic disease   . Peripheral vascular disease   . Sarcoidosis   . ESRD (end stage renal disease)     Hemodialysis since December 2015  . Renal insufficiency    Family History  Problem Relation Age of Onset  . Diabetes Mellitus II Mother   . Diabetes Mother    SOCIAL HISTORY: History  Substance Use Topics  . Smoking status: Former Smoker    Types: Cigarettes    Quit date: 04/17/1998  . Smokeless tobacco: Never Used  . Alcohol Use: 0.0 oz/week    0 Standard drinks or equivalent per week     Comment: Socially   No Known Allergies Current Outpatient Prescriptions  Medication Sig Dispense Refill  . acetaminophen (TYLENOL) 500 MG tablet Take 500 mg by mouth every other day.    Marland Kitchen aspirin EC 81 MG tablet Take 81 mg by mouth daily.    Marland Kitchen atorvastatin (LIPITOR) 40 MG tablet Take 40 mg by mouth daily.    . calcitRIOL (ROCALTROL) 0.25 MCG capsule Take 0.25 mcg by mouth daily.    . cloNIDine (CATAPRES) 0.2 MG tablet Take 1 tablet by mouth daily.     . ferrous sulfate 325 (65 FE) MG tablet Take 325 mg by mouth daily with breakfast.    . insulin glargine (LANTUS) 100 UNIT/ML injection Inject 10-20  Units into the skin at bedtime. IF CBG>120=20 units    . sitaGLIPtin (JANUVIA) 100 MG tablet Take 100 mg by mouth daily.     No current facility-administered medications for this visit.   REVIEW OF SYSTEMS: Valu.Nieves ] denotes positive finding; [  ] denotes negative finding  CARDIOVASCULAR:  [ ]  chest pain   [ ]  chest pressure   [ ]  palpitations   [ ]  orthopnea   [ ]  dyspnea on exertion   [ ]  claudication   [ ]  rest pain   [ ]  DVT   [ ]  phlebitis PULMONARY:   [ ]  productive cough   [ ]  asthma   [ ]  wheezing NEUROLOGIC:   [ ]  weakness  [ ]  paresthesias  [ ]  aphasia  [ ]  amaurosis  [ ]  dizziness HEMATOLOGIC:   [ ]  bleeding problems   [ ]  clotting disorders MUSCULOSKELETAL:  [ ]  joint pain   [ ]  joint swelling [ ]  leg swelling GASTROINTESTINAL: [ ]   blood in stool  [ ]   hematemesis GENITOURINARY:  [ ]   dysuria  [ ]   hematuria PSYCHIATRIC:  [ ]  history of major depression INTEGUMENTARY:  [ ]  rashes  [ ]  ulcers CONSTITUTIONAL:  [ ]  fever   [ ]  chills  PHYSICAL EXAM: Filed Vitals:  10/16/14 1519  BP: 150/80  Pulse: 87  Resp: 16  Height: 6' (1.829 m)  Weight: 156 lb (70.761 kg)  SpO2: 100%   GENERAL: The patient is a well-nourished male, in no acute distress. The vital signs are documented above. CARDIOVASCULAR: There is a regular rate and rhythm. His left upper arm AV fistula is occluded. He has a palpable radial pulse bilaterally. PULMONARY: There is good air exchange bilaterally without wheezing or rales. ABDOMEN: Soft and non-tender with normal pitched bowel sounds.  MUSCULOSKELETAL: There are no major deformities or cyanosis. NEUROLOGIC: No focal weakness or paresthesias are detected. SKIN: There are no ulcers or rashes noted. PSYCHIATRIC: The patient has a normal affect.  DATA:  I have independently interpreted the vein map of his right upper extremity which shows that he has a reasonable forearm and upper arm cephalic vein on the right and also a reasonable basilic vein.  MEDICAL  ISSUES:  I recommended that we place a fistula in the right arm and this has been scheduled for 11/03/2014. I have explained the indications for placement of an AV fistula or AV graft. I've explained that if at all possible we will place an AV fistula.  I have reviewed the risks of placement of an AV fistula including but not limited to: failure of the fistula to mature, need for subsequent interventions, and thrombosis. In addition I have reviewed the potential complications of placement of an AV graft. These risks include, but are not limited to, graft thrombosis, graft infection, wound healing problems, bleeding, arm swelling, and steal syndrome. All the patient's questions were answered and they are agreeable to proceed with surgery.    Deitra Mayo [facility] [place]: [phone]

## 2014-10-19 DIAGNOSIS — D509 Iron deficiency anemia, unspecified: Secondary | ICD-10-CM | POA: Diagnosis not present

## 2014-10-19 DIAGNOSIS — D631 Anemia in chronic kidney disease: Secondary | ICD-10-CM | POA: Diagnosis not present

## 2014-10-19 DIAGNOSIS — N186 End stage renal disease: Secondary | ICD-10-CM | POA: Diagnosis not present

## 2014-10-19 DIAGNOSIS — Z992 Dependence on renal dialysis: Secondary | ICD-10-CM | POA: Diagnosis not present

## 2014-10-20 ENCOUNTER — Other Ambulatory Visit: Payer: Self-pay

## 2014-10-21 ENCOUNTER — Encounter: Payer: Self-pay | Admitting: Nephrology

## 2014-10-21 DIAGNOSIS — D509 Iron deficiency anemia, unspecified: Secondary | ICD-10-CM | POA: Diagnosis not present

## 2014-10-21 DIAGNOSIS — D631 Anemia in chronic kidney disease: Secondary | ICD-10-CM | POA: Diagnosis not present

## 2014-10-21 DIAGNOSIS — Z992 Dependence on renal dialysis: Secondary | ICD-10-CM | POA: Diagnosis not present

## 2014-10-21 DIAGNOSIS — N186 End stage renal disease: Secondary | ICD-10-CM | POA: Diagnosis not present

## 2014-10-23 DIAGNOSIS — Z992 Dependence on renal dialysis: Secondary | ICD-10-CM | POA: Diagnosis not present

## 2014-10-23 DIAGNOSIS — N186 End stage renal disease: Secondary | ICD-10-CM | POA: Diagnosis not present

## 2014-10-23 DIAGNOSIS — D509 Iron deficiency anemia, unspecified: Secondary | ICD-10-CM | POA: Diagnosis not present

## 2014-10-23 DIAGNOSIS — D631 Anemia in chronic kidney disease: Secondary | ICD-10-CM | POA: Diagnosis not present

## 2014-11-02 ENCOUNTER — Encounter (HOSPITAL_COMMUNITY): Payer: Self-pay | Admitting: *Deleted

## 2014-11-02 MED ORDER — DEXTROSE 5 % IV SOLN
1.5000 g | INTRAVENOUS | Status: AC
  Administered 2014-11-03: 1.5 g via INTRAVENOUS
  Filled 2014-11-02: qty 1.5

## 2014-11-02 MED ORDER — SODIUM CHLORIDE 0.9 % IV SOLN
INTRAVENOUS | Status: DC
  Administered 2014-11-03: 12:00:00 via INTRAVENOUS

## 2014-11-02 NOTE — Progress Notes (Signed)
Anesthesia Chart Review: SAME DAY WORK-UP.  Patient is a 64 year old male scheduled for RUE AVF creation 11/03/14 by Dr. Scot Dock. He has a history of left brachiocephalic AVF s/p venoplasty of the cephalic vein 8/83/25 but later thrombosed with unsuccessful thrombolysis. S/P hemodialysis catheter insertion 09/30/14. He now need a new permanent HD access.   History includes former smoker, HTN, DM2, PVD s/p left BKA, ESRD on HD (TTS) since 03/2014, Sarcoidosis, esophageal dilation. Cardiology notes indicate that he is legally blind.   PCP is Dr. Rosita Fire. He was recently referred to cardiologist Dr. Bronson Ing ([facility]) for coronary atherosclerosis on CT, abnormal EKG with need for cardiac evaluation as part of pre-renal transplant work-up [facility]). Recent stress showed possible old infarct, but was considered a low risk stress test (see below).  07/27/14 EKG: SR with first degree AVB, T wave abnormality, consider lateral ischemia.  09/17/14 Nuclear stress test:   There was no ST segment deviation noted during stress.  T wave inversion was noted during stress in the inferolateral, lateral and high-lateral leads (I, II, aVL, aVF, V5, V6 and V4), beginning at 0 minutes of stress, ending at 0 minutes of stress.  Findings consistent with prior myocardial infarction.  This is a low risk study.  The left ventricular ejection fraction is normal (55-65%). Apical thinning was seen. There are small, mildly intense, fixed defects seen in the apical wall and mid-inferior wall, suggestive of prior infarct. Soft tissue attenuation artifact cannot entirely be ruled out. Per 09/17/14 notation by Dr. Bronson Ing, "Possible old infarct, but shadowing also noted. Normal pumping function. Overall, low risk study.  09/17/14 Echo: Study Conclusions - Left ventricle: The cavity size was normal. There was mild concentric hypertrophy. Systolic function was normal. The estimated ejection fraction was in the range of  55% to 60%. Wall motion was normal; there were no regional wall motionabnormalities. Doppler parameters are consistent with abnormal left ventricular relaxation (grade 1 diastolic dysfunction). - Aortic valve: Moderately calcified annulus. Trileaflet; mildly thickened leaflets. There was no stenosis. - Mitral valve: Moderately to severely calcified annulus.  09/17/14 Carotid duplex: IMPRESSION: No evidence of right carotid stenosis in the neck. Mild plaque at the level of the left carotid bulb and proximal ICA with estimated left ICA stenosis of less than 50%.  05/29/14 CXR [facility], Care Everywhere): Impression: 1. Normal cardiac silhouette. Mediastinal contours within normal limits. 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 2. Normal appearance to the lungs and pleural spaces.  He is for labs on arrival. If labs are acceptable and no acute changes then I would anticipate that he could proceed as planned.  George Hugh [facility] Short Stay Center/Anesthesiology Phone [phone] 11/02/2014 12:51 PM

## 2014-11-02 NOTE — Progress Notes (Signed)
Pt denies SOB, chest pain, and being under the care of a cardiologist. Pt denies having a cardiac cath. Pt made aware to stop taking otc vitamins and herbal medications. Do not take any NSAIDs ie: Ibuprofen, Advil, Naproxen or etc. Pt verbalized understanding of all pre-op instructions.

## 2014-11-03 ENCOUNTER — Ambulatory Visit (HOSPITAL_COMMUNITY)
Admission: RE | Admit: 2014-11-03 | Discharge: 2014-11-03 | Disposition: A | Discharge status: Home / Self Care | Payer: Medicare Other | Source: Ambulatory Visit | Attending: Vascular Surgery | Admitting: Vascular Surgery

## 2014-11-03 ENCOUNTER — Encounter (HOSPITAL_COMMUNITY): Payer: Self-pay | Admitting: *Deleted

## 2014-11-03 ENCOUNTER — Encounter (HOSPITAL_COMMUNITY)
Admission: RE | Disposition: A | Discharge status: Home / Self Care | Payer: Self-pay | Source: Ambulatory Visit | Attending: Vascular Surgery

## 2014-11-03 ENCOUNTER — Ambulatory Visit (HOSPITAL_COMMUNITY): Payer: Medicare Other | Admitting: Vascular Surgery

## 2014-11-03 DIAGNOSIS — E119 Type 2 diabetes mellitus without complications: Secondary | ICD-10-CM | POA: Diagnosis not present

## 2014-11-03 DIAGNOSIS — D869 Sarcoidosis, unspecified: Secondary | ICD-10-CM | POA: Insufficient documentation

## 2014-11-03 DIAGNOSIS — Z7982 Long term (current) use of aspirin: Secondary | ICD-10-CM | POA: Diagnosis not present

## 2014-11-03 DIAGNOSIS — N186 End stage renal disease: Secondary | ICD-10-CM | POA: Diagnosis not present

## 2014-11-03 DIAGNOSIS — Z79899 Other long term (current) drug therapy: Secondary | ICD-10-CM | POA: Insufficient documentation

## 2014-11-03 DIAGNOSIS — Z87891 Personal history of nicotine dependence: Secondary | ICD-10-CM | POA: Diagnosis not present

## 2014-11-03 DIAGNOSIS — I120 Hypertensive chronic kidney disease with stage 5 chronic kidney disease or end stage renal disease: Secondary | ICD-10-CM | POA: Diagnosis not present

## 2014-11-03 DIAGNOSIS — Z89512 Acquired absence of left leg below knee: Secondary | ICD-10-CM | POA: Insufficient documentation

## 2014-11-03 DIAGNOSIS — D638 Anemia in other chronic diseases classified elsewhere: Secondary | ICD-10-CM | POA: Diagnosis not present

## 2014-11-03 DIAGNOSIS — I70208 Unspecified atherosclerosis of native arteries of extremities, other extremity: Secondary | ICD-10-CM | POA: Insufficient documentation

## 2014-11-03 DIAGNOSIS — I739 Peripheral vascular disease, unspecified: Secondary | ICD-10-CM | POA: Insufficient documentation

## 2014-11-03 DIAGNOSIS — K219 Gastro-esophageal reflux disease without esophagitis: Secondary | ICD-10-CM | POA: Diagnosis not present

## 2014-11-03 DIAGNOSIS — Z992 Dependence on renal dialysis: Secondary | ICD-10-CM | POA: Diagnosis not present

## 2014-11-03 DIAGNOSIS — Z794 Long term (current) use of insulin: Secondary | ICD-10-CM | POA: Diagnosis not present

## 2014-11-03 DIAGNOSIS — N185 Chronic kidney disease, stage 5: Secondary | ICD-10-CM | POA: Diagnosis not present

## 2014-11-03 HISTORY — PX: AV FISTULA PLACEMENT: SHX1204

## 2014-11-03 HISTORY — DX: Gastro-esophageal reflux disease without esophagitis: K21.9

## 2014-11-03 LAB — GLUCOSE, CAPILLARY
Glucose-Capillary: 127 mg/dL — ABNORMAL HIGH (ref 65–99)
Glucose-Capillary: 120 mg/dL — ABNORMAL HIGH (ref 65–99)

## 2014-11-03 LAB — POCT I-STAT 4, (NA,K, GLUC, HGB,HCT)
Glucose, Bld: 138 mg/dL — ABNORMAL HIGH (ref 65–99)
HEMATOCRIT: 36 % — AB (ref 39.0–52.0)
HEMOGLOBIN: 12.2 g/dL — AB (ref 13.0–17.0)
Potassium: 4.6 mmol/L (ref 3.5–5.1)
Sodium: 140 mmol/L (ref 135–145)

## 2014-11-03 SURGERY — INSERTION OF ARTERIOVENOUS (AV) GORE-TEX GRAFT ARM
Anesthesia: Monitor Anesthesia Care | Site: Arm Lower | Laterality: Right

## 2014-11-03 MED ORDER — LIDOCAINE-EPINEPHRINE (PF) 1 %-1:200000 IJ SOLN
INTRAMUSCULAR | Status: AC
  Filled 2014-11-03: qty 10

## 2014-11-03 MED ORDER — CHLORHEXIDINE GLUCONATE CLOTH 2 % EX PADS: 6.0000 | MEDICATED_PAD | Freq: Once | CUTANEOUS | Status: DC

## 2014-11-03 MED ORDER — FENTANYL CITRATE (PF) 250 MCG/5ML IJ SOLN
INTRAMUSCULAR | Status: AC
  Filled 2014-11-03: qty 5

## 2014-11-03 MED ORDER — SODIUM CHLORIDE 0.9 % IV SOLN
INTRAVENOUS | Status: DC | PRN
  Administered 2014-11-03 (×2): via INTRAVENOUS

## 2014-11-03 MED ORDER — ONDANSETRON HCL 4 MG/2ML IJ SOLN
INTRAMUSCULAR | Status: AC
  Filled 2014-11-03: qty 2

## 2014-11-03 MED ORDER — LIDOCAINE HCL (PF) 1 % IJ SOLN
INTRAMUSCULAR | Status: DC | PRN
  Administered 2014-11-03: 30 mL

## 2014-11-03 MED ORDER — PHENYLEPHRINE 40 MCG/ML (10ML) SYRINGE FOR IV PUSH (FOR BLOOD PRESSURE SUPPORT)
PREFILLED_SYRINGE | INTRAVENOUS | Status: AC
  Filled 2014-11-03: qty 40

## 2014-11-03 MED ORDER — PROTAMINE SULFATE 10 MG/ML IV SOLN
INTRAVENOUS | Status: DC | PRN
  Administered 2014-11-03 (×3): 10 mg via INTRAVENOUS

## 2014-11-03 MED ORDER — HEPARIN SODIUM (PORCINE) 1000 UNIT/ML IJ SOLN
INTRAMUSCULAR | Status: DC | PRN
  Administered 2014-11-03: 6000 [IU] via INTRAVENOUS

## 2014-11-03 MED ORDER — HEPARIN SODIUM (PORCINE) 1000 UNIT/ML IJ SOLN
INTRAMUSCULAR | Status: AC
  Filled 2014-11-03: qty 2

## 2014-11-03 MED ORDER — LIDOCAINE HCL (CARDIAC) 20 MG/ML IV SOLN
INTRAVENOUS | Status: DC | PRN
  Administered 2014-11-03: 40 mg via INTRAVENOUS

## 2014-11-03 MED ORDER — ONDANSETRON HCL 4 MG/2ML IJ SOLN
INTRAMUSCULAR | Status: DC | PRN
  Administered 2014-11-03: 4 mg via INTRAVENOUS

## 2014-11-03 MED ORDER — 0.9 % SODIUM CHLORIDE (POUR BTL) OPTIME
TOPICAL | Status: DC | PRN
  Administered 2014-11-03: 1000 mL

## 2014-11-03 MED ORDER — MIDAZOLAM HCL 2 MG/2ML IJ SOLN
INTRAMUSCULAR | Status: AC
  Filled 2014-11-03: qty 2

## 2014-11-03 MED ORDER — PROPOFOL INFUSION 10 MG/ML OPTIME
INTRAVENOUS | Status: DC | PRN
  Administered 2014-11-03: 80 ug/kg/min via INTRAVENOUS
  Administered 2014-11-03: 120 ug/kg/min via INTRAVENOUS
  Administered 2014-11-03: 100 ug/kg/min via INTRAVENOUS

## 2014-11-03 MED ORDER — HEPARIN SODIUM (PORCINE) 5000 UNIT/ML IJ SOLN
INTRAMUSCULAR | Status: DC | PRN
  Administered 2014-11-03: 500 mL

## 2014-11-03 MED ORDER — THROMBIN 20000 UNITS EX SOLR
CUTANEOUS | Status: AC
  Filled 2014-11-03: qty 20000

## 2014-11-03 MED ORDER — LIDOCAINE-EPINEPHRINE (PF) 1 %-1:200000 IJ SOLN
INTRAMUSCULAR | Status: DC | PRN
  Administered 2014-11-03: 15 mL

## 2014-11-03 MED ORDER — OXYCODONE-ACETAMINOPHEN 5-325 MG PO TABS: 1.0000 | ORAL_TABLET | Freq: Four times a day (QID) | ORAL | Status: DC | PRN

## 2014-11-03 MED ORDER — PROTAMINE SULFATE 10 MG/ML IV SOLN
INTRAVENOUS | Status: AC
  Filled 2014-11-03: qty 10

## 2014-11-03 MED ORDER — MIDAZOLAM HCL 5 MG/5ML IJ SOLN
INTRAMUSCULAR | Status: DC | PRN
  Administered 2014-11-03: 2 mg via INTRAVENOUS

## 2014-11-03 MED ORDER — PHENYLEPHRINE HCL 10 MG/ML IJ SOLN
INTRAMUSCULAR | Status: DC | PRN
  Administered 2014-11-03: 80 ug via INTRAVENOUS
  Administered 2014-11-03: 40 ug via INTRAVENOUS
  Administered 2014-11-03: 80 ug via INTRAVENOUS
  Administered 2014-11-03: 40 ug via INTRAVENOUS
  Administered 2014-11-03 (×2): 80 ug via INTRAVENOUS
  Administered 2014-11-03: 120 ug via INTRAVENOUS

## 2014-11-03 MED ORDER — LIDOCAINE HCL (PF) 1 % IJ SOLN
INTRAMUSCULAR | Status: AC
  Filled 2014-11-03: qty 30

## 2014-11-03 MED ORDER — FENTANYL CITRATE (PF) 100 MCG/2ML IJ SOLN
INTRAMUSCULAR | Status: DC | PRN
  Administered 2014-11-03: 25 ug via INTRAVENOUS

## 2014-11-03 SURGICAL SUPPLY — 39 items
ARMBAND PINK RESTRICT EXTREMIT (MISCELLANEOUS) ×4 IMPLANT
CANISTER SUCTION 2500CC (MISCELLANEOUS) ×4 IMPLANT
CANNULA VESSEL 3MM 2 BLNT TIP (CANNULA) ×7 IMPLANT
CLIP TI MEDIUM 6 (CLIP) ×4 IMPLANT
CLIP TI WIDE RED SMALL 6 (CLIP) ×7 IMPLANT
COVER PROBE W GEL 5X96 (DRAPES) ×3 IMPLANT
DECANTER SPIKE VIAL GLASS SM (MISCELLANEOUS) ×1 IMPLANT
ELECT REM PT RETURN 9FT ADLT (ELECTROSURGICAL) ×4
ELECTRODE REM PT RTRN 9FT ADLT (ELECTROSURGICAL) ×2 IMPLANT
GLOVE BIO SURGEON STRL SZ7 (GLOVE) ×3 IMPLANT
GLOVE BIO SURGEON STRL SZ7.5 (GLOVE) ×4 IMPLANT
GLOVE BIOGEL PI IND STRL 6 (GLOVE) ×1 IMPLANT
GLOVE BIOGEL PI IND STRL 6.5 (GLOVE) ×2 IMPLANT
GLOVE BIOGEL PI IND STRL 7.5 (GLOVE) ×2 IMPLANT
GLOVE BIOGEL PI IND STRL 8 (GLOVE) ×3 IMPLANT
GLOVE BIOGEL PI IND STRL 8.5 (GLOVE) ×1 IMPLANT
GLOVE BIOGEL PI INDICATOR 6 (GLOVE) ×2
GLOVE BIOGEL PI INDICATOR 6.5 (GLOVE) ×4
GLOVE BIOGEL PI INDICATOR 7.5 (GLOVE) ×4
GLOVE BIOGEL PI INDICATOR 8 (GLOVE) ×4
GLOVE BIOGEL PI INDICATOR 8.5 (GLOVE) ×2
GOWN STRL REUS W/ TWL LRG LVL3 (GOWN DISPOSABLE) ×7 IMPLANT
GOWN STRL REUS W/ TWL XL LVL3 (GOWN DISPOSABLE) ×1 IMPLANT
GOWN STRL REUS W/TWL LRG LVL3 (GOWN DISPOSABLE) ×16
GOWN STRL REUS W/TWL XL LVL3 (GOWN DISPOSABLE) ×4
GRAFT GORETEX STRT 4-7X45 (Vascular Products) ×3 IMPLANT
KIT BASIN OR (CUSTOM PROCEDURE TRAY) ×4 IMPLANT
KIT ROOM TURNOVER OR (KITS) ×4 IMPLANT
LIQUID BAND (GAUZE/BANDAGES/DRESSINGS) ×7 IMPLANT
NS IRRIG 1000ML POUR BTL (IV SOLUTION) ×4 IMPLANT
PACK CV ACCESS (CUSTOM PROCEDURE TRAY) ×4 IMPLANT
PAD ARMBOARD 7.5X6 YLW CONV (MISCELLANEOUS) ×8 IMPLANT
SPONGE SURGIFOAM ABS GEL 100 (HEMOSTASIS) IMPLANT
SUT PROLENE 6 0 BV (SUTURE) ×10 IMPLANT
SUT VIC AB 3-0 SH 27 (SUTURE) ×8
SUT VIC AB 3-0 SH 27X BRD (SUTURE) ×3 IMPLANT
SUT VICRYL 4-0 PS2 18IN ABS (SUTURE) ×7 IMPLANT
UNDERPAD 30X30 INCONTINENT (UNDERPADS AND DIAPERS) ×4 IMPLANT
WATER STERILE IRR 1000ML POUR (IV SOLUTION) ×4 IMPLANT

## 2014-11-03 NOTE — Transfer of Care (Signed)
Immediate Anesthesia Transfer of Care Note  Patient: Wayne [name]  Procedure(s) Performed: Procedure(s): INSERTION OF RIGHT ARM  ARTERIOVENOUS (AV) GORE-TEX GRAFT (Right)  Patient Location: PACU  Anesthesia Type:MAC  Level of Consciousness: awake, alert  and oriented  Airway & Oxygen Therapy: Patient Spontanous Breathing  Post-op Assessment: Report given to RN and Post -op Vital signs reviewed and stable  Post vital signs: Reviewed and stable  Last Vitals:  Filed Vitals:   11/03/14 1131  BP: 136/74  Pulse: 57  Temp: 36.5 C  Resp: 18    Complications: No apparent anesthesia complications

## 2014-11-03 NOTE — Op Note (Signed)
    NAME: Wayne [name]    MRN: [id number] DOB: 1950-04-24    DATE OF OPERATION: 11/03/2014  PREOP DIAGNOSIS: Stage V chronic kidney disease  POSTOP DIAGNOSIS: Same  PROCEDURE:  1. Exploration of right forearm cephalic vein and right radial artery 2. Right forearm AV graft (4-7 mm PTFE graft)  SURGEON: Judeth Cornfield. Scot Dock, MD, FACS  ASSIST: Gerri Lins PA  ANESTHESIA: local with sedation   EBL: minimal  INDICATIONS: Wayne [name] is a 64 y.o. male who presents for new access. He has a functioning catheter. Preoperative vein mapping suggested a reasonable forearm and upper arm cephalic vein although by my duplex the cephalic vein and basilic vein all looked marginal in size.  FINDINGS: the right radial artery was too calcified to be used for inflow. The upper arm cephalic vein was not identified and the basilic vein was small and sclerotic. Therefore a loop forearm graft was placed. The arterial aspect of the graft is along the radial aspect of the forearm.  TECHNIQUE: The patient was taken to the operating room and sedated by anesthesia. The right upper extremity was prepped and draped in usual sterile fashion. After the skin was anesthetized with 1% lidocaine, an oblique incision was made at the wrist. The cephalic vein was dissected free. The vein was small but potentially usable. However I dissected free the radial artery beneath the fascia and this was markedly calcified and could not be clamped therefore it could not be used for inflow. The upper arm cephalic vein could not be identified in the basilic vein in the upper arm wound looks small and sclerotic. I therefore elected to place a loop forearm graft. A transverse incision was made just above the antecubital level after the skin was anesthetized. Here the brachial artery and adjacent brachial vein were dissected free. This artery to somewhat calcified but had a good pulse. Using one distal counter incision after the skin was  anesthetized a 4-7 mm PTFE graft was tunneled in a loop fashion in the form of the arterial aspect of the graft along the radial aspect of the forearm. The patient was then heparinized. The brachial artery was clamped proximally and distally and a longitudinal arteriotomy was made. A segment of the 4 mm end of the graft excised the graft spatulated and sewn end-to-side to the artery using continuous 60 proline suture. The grafts and pulled the appropriate length for anastomosis to the brachial vein. The vein was ligated distally and spatulated proximally. The graft was sewn into into the vein using 2 continuous 60 proline sutures. At the completion was good thrill in the fistula and a radial and ulnar signal with the Doppler. Heparin was partially reversed with protamine. The wounds were then closed with a [place] Vicryl and the skin closed with 4-0 Vicryl. Liquid band was applied. The patient tolerated the procedure well and transferred to the recovery room in stable condition. All needle and sponge counts were correct.  Deitra Mayo, MD, FACS [facility]  DATE OF DICTATION:   11/03/2014

## 2014-11-03 NOTE — H&P (View-Only) (Signed)
[facility] [place]  Patient name: Wayne [name] MRN: [id number] DOB: July 11, 1950 Sex: male  REASON FOR VISIT: evaluate for new hemodialysis access. Referred by Dr. Otelia Santee.  HPI: Wayne [name] is a 64 y.o. male who had a left brachiocephalic AV fistula and I performed a venoplasty on this on 07/27/2014. Patient subsequently had attempted declot of the fistula by Dr. Augustin Coupe. This was not successful and the patient is referred for evaluation for new access.  I did review the records that were sent from [facility]. Attempted thrombolysis was not successful and a catheter was placed. This was on 09/30/14.  He has no pain or paresthesias in the left upper extremity.   Past Medical History  Diagnosis Date  . Type 2 diabetes mellitus   . Essential hypertension   . Anemia of chronic disease   . Peripheral vascular disease   . Sarcoidosis   . ESRD (end stage renal disease)     Hemodialysis since December 2015  . Renal insufficiency    Family History  Problem Relation Age of Onset  . Diabetes Mellitus II Mother   . Diabetes Mother    SOCIAL HISTORY: History  Substance Use Topics  . Smoking status: Former Smoker    Types: Cigarettes    Quit date: 04/17/1998  . Smokeless tobacco: Never Used  . Alcohol Use: 0.0 oz/week    0 Standard drinks or equivalent per week     Comment: Socially   No Known Allergies Current Outpatient Prescriptions  Medication Sig Dispense Refill  . acetaminophen (TYLENOL) 500 MG tablet Take 500 mg by mouth every other day.    Marland Kitchen aspirin EC 81 MG tablet Take 81 mg by mouth daily.    Marland Kitchen atorvastatin (LIPITOR) 40 MG tablet Take 40 mg by mouth daily.    . calcitRIOL (ROCALTROL) 0.25 MCG capsule Take 0.25 mcg by mouth daily.    . cloNIDine (CATAPRES) 0.2 MG tablet Take 1 tablet by mouth daily.     . ferrous sulfate 325 (65 FE) MG tablet Take 325 mg by mouth daily with breakfast.    . insulin glargine (LANTUS) 100 UNIT/ML injection Inject 10-20  Units into the skin at bedtime. IF CBG>120=20 units    . sitaGLIPtin (JANUVIA) 100 MG tablet Take 100 mg by mouth daily.     No current facility-administered medications for this visit.   REVIEW OF SYSTEMS: Valu.Nieves ] denotes positive finding; [  ] denotes negative finding  CARDIOVASCULAR:  [ ]  chest pain   [ ]  chest pressure   [ ]  palpitations   [ ]  orthopnea   [ ]  dyspnea on exertion   [ ]  claudication   [ ]  rest pain   [ ]  DVT   [ ]  phlebitis PULMONARY:   [ ]  productive cough   [ ]  asthma   [ ]  wheezing NEUROLOGIC:   [ ]  weakness  [ ]  paresthesias  [ ]  aphasia  [ ]  amaurosis  [ ]  dizziness HEMATOLOGIC:   [ ]  bleeding problems   [ ]  clotting disorders MUSCULOSKELETAL:  [ ]  joint pain   [ ]  joint swelling [ ]  leg swelling GASTROINTESTINAL: [ ]   blood in stool  [ ]   hematemesis GENITOURINARY:  [ ]   dysuria  [ ]   hematuria PSYCHIATRIC:  [ ]  history of major depression INTEGUMENTARY:  [ ]  rashes  [ ]  ulcers CONSTITUTIONAL:  [ ]  fever   [ ]  chills  PHYSICAL EXAM: Filed Vitals:  10/16/14 1519  BP: 150/80  Pulse: 87  Resp: 16  Height: 6' (1.829 m)  Weight: 156 lb (70.761 kg)  SpO2: 100%   GENERAL: The patient is a well-nourished male, in no acute distress. The vital signs are documented above. CARDIOVASCULAR: There is a regular rate and rhythm. His left upper arm AV fistula is occluded. He has a palpable radial pulse bilaterally. PULMONARY: There is good air exchange bilaterally without wheezing or rales. ABDOMEN: Soft and non-tender with normal pitched bowel sounds.  MUSCULOSKELETAL: There are no major deformities or cyanosis. NEUROLOGIC: No focal weakness or paresthesias are detected. SKIN: There are no ulcers or rashes noted. PSYCHIATRIC: The patient has a normal affect.  DATA:  I have independently interpreted the vein map of his right upper extremity which shows that he has a reasonable forearm and upper arm cephalic vein on the right and also a reasonable basilic vein.  MEDICAL  ISSUES:  I recommended that we place a fistula in the right arm and this has been scheduled for 11/03/2014. I have explained the indications for placement of an AV fistula or AV graft. I've explained that if at all possible we will place an AV fistula.  I have reviewed the risks of placement of an AV fistula including but not limited to: failure of the fistula to mature, need for subsequent interventions, and thrombosis. In addition I have reviewed the potential complications of placement of an AV graft. These risks include, but are not limited to, graft thrombosis, graft infection, wound healing problems, bleeding, arm swelling, and steal syndrome. All the patient's questions were answered and they are agreeable to proceed with surgery.    Deitra Mayo [facility] [place]: [phone]

## 2014-11-03 NOTE — Progress Notes (Signed)
Wife was given all belongings including prothesis, wallet

## 2014-11-03 NOTE — Anesthesia Preprocedure Evaluation (Addendum)
Anesthesia Evaluation  Patient identified by MRN, date of birth, ID band Patient awake    Reviewed: Allergy & Precautions, NPO status , Patient's Chart, lab work & pertinent test results  History of Anesthesia Complications Negative for: history of anesthetic complications  Airway Mallampati: II  TM Distance: >3 FB Neck ROM: Full    Dental  (+) Partial Upper, Dental Advisory Given   Pulmonary former smoker,  breath sounds clear to auscultation  Pulmonary exam normal       Cardiovascular Exercise Tolerance: Good hypertension, Pt. on medications + Peripheral Vascular Disease Rhythm:Regular Rate:Normal     Neuro/Psych    GI/Hepatic GERD-  Controlled,  Endo/Other  diabetes, Well Controlled, Type 2, Oral Hypoglycemic Agents, Insulin Dependent  Renal/GU Dialysis and ESRFRenal diseaseHD 7/18     Musculoskeletal   Abdominal Normal abdominal exam  (+)   Peds  Hematology  (+) anemia ,   Anesthesia Other Findings   Reproductive/Obstetrics                           Anesthesia Physical Anesthesia Plan  ASA: III  Anesthesia Plan: MAC   Post-op Pain Management:    Induction: Intravenous  Airway Management Planned: Simple Face Mask  Additional Equipment:   Intra-op Plan:   Post-operative Plan:   Informed Consent: I have reviewed the patients History and Physical, chart, labs and discussed the procedure including the risks, benefits and alternatives for the proposed anesthesia with the patient or authorized representative who has indicated his/her understanding and acceptance.   Dental advisory given  Plan Discussed with: Anesthesiologist and Surgeon  Anesthesia Plan Comments:         Anesthesia Quick Evaluation

## 2014-11-03 NOTE — Progress Notes (Signed)
Pt has pre existing Diatek in right upper chest-both ports capped & clamped, dsg CDI. HD access record faxed to [facility]

## 2014-11-03 NOTE — Interval H&P Note (Signed)
History and Physical Interval Note:  11/03/2014 1:36 PM  Wayne [name]  has presented today for surgery, with the diagnosis of End Stage Renal Disease N18.6  The various methods of treatment have been discussed with the patient and family. After consideration of risks, benefits and other options for treatment, the patient has consented to  Procedure(s): ARTERIOVENOUS (AV) FISTULA CREATION (Right) as a surgical intervention .  The patient's history has been reviewed, patient examined, no change in status, stable for surgery.  I have reviewed the patient's chart and labs.  Questions were answered to the patient's satisfaction.     Deitra Mayo

## 2014-11-03 NOTE — Anesthesia Postprocedure Evaluation (Signed)
  Anesthesia Post-op Note  Patient: Wayne [name]  Procedure(s) Performed: Procedure(s): INSERTION OF RIGHT ARM  ARTERIOVENOUS (AV) GORE-TEX GRAFT (Right)  Patient Location: PACU  Anesthesia Type:MAC  Level of Consciousness: awake, alert  and oriented  Airway and Oxygen Therapy: Patient Spontanous Breathing  Post-op Pain: none  Post-op Assessment: Post-op Vital signs reviewed              Post-op Vital Signs: Reviewed  Last Vitals:  Filed Vitals:   11/03/14 1645  BP:   Pulse: 95  Temp: 36.8 C  Resp: 19    Complications: No apparent anesthesia complications

## 2014-11-05 ENCOUNTER — Encounter (HOSPITAL_COMMUNITY): Payer: Self-pay | Admitting: Vascular Surgery

## 2014-11-10 ENCOUNTER — Encounter: Payer: Self-pay | Admitting: Vascular Surgery

## 2014-11-10 ENCOUNTER — Ambulatory Visit (INDEPENDENT_AMBULATORY_CARE_PROVIDER_SITE_OTHER): Payer: Self-pay | Admitting: Vascular Surgery

## 2014-11-10 VITALS — BP 170/62 | HR 84 | Temp 98.6°F | Resp 18 | Ht 73.0 in | Wt 170.0 lb

## 2014-11-10 DIAGNOSIS — N186 End stage renal disease: Secondary | ICD-10-CM

## 2014-11-10 DIAGNOSIS — Z992 Dependence on renal dialysis: Secondary | ICD-10-CM

## 2014-11-10 NOTE — Progress Notes (Signed)
  POST OPERATIVE OFFICE NOTE    CC:  F/u for surgery  HPI:  This is a 64 y.o. male who is s/p right forearm loop graft placed by Dr. Scot Dock on 11/03/14.  He states he has done well.  He states that he had the graft placed last Tuesday.  He went to HD on Wednesday and did not have any problems.  After about 3 hours on the dialysis machine on Friday, he states he had some worsening of the swelling in the right arm around the graft.  He denies any pain in his right hand with the exception of some numbness after being on the machine for a while.  He denies any fevers.    No Known Allergies  Current Outpatient Prescriptions  Medication Sig Dispense Refill  . acetaminophen (TYLENOL) 500 MG tablet Take 500 mg by mouth daily as needed (pain).     Marland Kitchen aspirin EC 81 MG tablet Take 81 mg by mouth at bedtime.     Marland Kitchen atorvastatin (LIPITOR) 40 MG tablet Take 40 mg by mouth at bedtime.     . calcitRIOL (ROCALTROL) 0.25 MCG capsule Take 0.25 mcg by mouth at bedtime.     . cloNIDine (CATAPRES) 0.2 MG tablet Take 0.2 mg by mouth at bedtime.     . insulin glargine (LANTUS) 100 UNIT/ML injection Inject 20 Units into the skin every other day. In the morning    . oxyCODONE-acetaminophen (PERCOCET/ROXICET) 5-325 MG per tablet Take 1 tablet by mouth every 6 (six) hours as needed for moderate pain. 20 tablet 0  . sevelamer carbonate (RENVELA) 800 MG tablet Take 800-1,600 mg by mouth See admin instructions. Take 2 tablets (1600 mg) by mouth with meals and 1 tablet (800 mg) with snacks    . sitaGLIPtin (JANUVIA) 100 MG tablet Take 100 mg by mouth at bedtime.      No current facility-administered medications for this visit.     ROS:  See HPI  Physical Exam:  Filed Vitals:   11/10/14 1327  BP: 170/62  Pulse:   Temp:   Resp:     Incision:  Healing nicely.   Extremities:  Peau d'orange appearance right forearm in the middle of the loop with some erythema and mild swelling.  There is a + bruit/thrill within the  graft.  Sensation and motor are in tact.  Right diatek catheter in place.    Assessment/Plan:  This is a 64 y.o. male who is s/p: Placement of right forearm loop graft  -swelling around the forearm graft is most likely from mild reaction to Gortex.  Would continue to dialyze via diatek catheter and stick graft in 3 weeks.  After 2-3 successful treatments via graft, pt may have diatek removed by Dr. Augustin Coupe as the catheter placed by Dr. Augustin Coupe on 09/30/14. -continue with arm elevation  -f/u with [facility] as needed.   Leontine Locket, PA-C [facility] [phone]  Clinic MD:  Pt seen and examined with Dr. Kellie Simmering  agree with above  likely minor reaction to the Gore-Tex with edema within the loop -should resolve

## 2014-11-10 NOTE — Progress Notes (Signed)
Filed Vitals:   11/10/14 1325 11/10/14 1327  BP: 172/64 170/62  Pulse: 84   Temp: 98.6 F (37 C)   Resp: 18   Height: 6\' 1"  (1.854 m)   Weight: 170 lb (77.111 kg)   SpO2: 100%

## 2014-11-15 DIAGNOSIS — Z992 Dependence on renal dialysis: Secondary | ICD-10-CM | POA: Diagnosis not present

## 2014-11-15 DIAGNOSIS — N186 End stage renal disease: Secondary | ICD-10-CM | POA: Diagnosis not present

## 2014-11-24 ENCOUNTER — Ambulatory Visit (INDEPENDENT_AMBULATORY_CARE_PROVIDER_SITE_OTHER): Payer: Medicare Other | Admitting: Cardiovascular Disease

## 2014-11-24 ENCOUNTER — Encounter: Payer: Self-pay | Admitting: Cardiovascular Disease

## 2014-11-24 VITALS — BP 132/80 | HR 79 | Ht 73.0 in | Wt 155.0 lb

## 2014-11-24 DIAGNOSIS — R9431 Abnormal electrocardiogram [ECG] [EKG]: Secondary | ICD-10-CM | POA: Diagnosis not present

## 2014-11-24 DIAGNOSIS — I6522 Occlusion and stenosis of left carotid artery: Secondary | ICD-10-CM | POA: Diagnosis not present

## 2014-11-24 DIAGNOSIS — I2510 Atherosclerotic heart disease of native coronary artery without angina pectoris: Secondary | ICD-10-CM

## 2014-11-24 DIAGNOSIS — R931 Abnormal findings on diagnostic imaging of heart and coronary circulation: Secondary | ICD-10-CM | POA: Diagnosis not present

## 2014-11-24 DIAGNOSIS — I10 Essential (primary) hypertension: Secondary | ICD-10-CM

## 2014-11-24 DIAGNOSIS — N186 End stage renal disease: Secondary | ICD-10-CM

## 2014-11-24 NOTE — Progress Notes (Signed)
Patient ID: Wayne [name], male   DOB: 1951/02/07, 64 y.o.   MRN: [id number]      SUBJECTIVE: The patient returns for follow-up after undergoing cardiovascular testing performed for the evaluation of coronary atherosclerosis seen on chest CT and for carotid bruit.  He successfully underwent right forearm AV graft placement on 11/03/2014.  There appeared to be a fixed defect in the apical and mid inferior wall suggestive of prior infarct, but soft tissue attenuation artifact could not entirely be excluded on nuclear stress testing on 09-17-14. It was a low risk study. ECG abnormalities were noted.  Echocardiogram on 09-17-14 demonstrated normal left ventricular systolic function, LVEF 40-97%, mild concentric LVH, and grade 1 diastolic dysfunction. There was calcification of both the aortic and mitral valve annulus.  Carotid artery Dopplers on 09-17-14 demonstrated less than 50% stenosis of the left internal carotid artery.  He denies chest pain. He had been experiencing shortness of breath prior to undergoing dialysis but has had no recurrences. He feels well.    Review of Systems: As per "subjective", otherwise negative.  No Known Allergies  Current Outpatient Prescriptions  Medication Sig Dispense Refill  . acetaminophen (TYLENOL) 500 MG tablet Take 500 mg by mouth daily as needed (pain).     Marland Kitchen aspirin EC 81 MG tablet Take 81 mg by mouth at bedtime.     Marland Kitchen atorvastatin (LIPITOR) 40 MG tablet Take 40 mg by mouth at bedtime.     . calcitRIOL (ROCALTROL) 0.25 MCG capsule Take 0.25 mcg by mouth at bedtime.     . cloNIDine (CATAPRES) 0.2 MG tablet Take 0.2 mg by mouth at bedtime.     . insulin glargine (LANTUS) 100 UNIT/ML injection Inject 20 Units into the skin every other day. In the morning    . oxyCODONE-acetaminophen (PERCOCET/ROXICET) 5-325 MG per tablet Take 1 tablet by mouth every 6 (six) hours as needed for moderate pain. 20 tablet 0  . sevelamer carbonate (RENVELA) 800 MG tablet Take  800-1,600 mg by mouth See admin instructions. Take 2 tablets (1600 mg) by mouth with meals and 1 tablet (800 mg) with snacks    . sitaGLIPtin (JANUVIA) 100 MG tablet Take 100 mg by mouth at bedtime.      No current facility-administered medications for this visit.    Past Medical History  Diagnosis Date  . Type 2 diabetes mellitus   . Essential hypertension   . Anemia of chronic disease   . Peripheral vascular disease   . Sarcoidosis   . ESRD (end stage renal disease)     Hemodialysis since December 2015  . Renal insufficiency   . GERD (gastroesophageal reflux disease)     PMH:     Past Surgical History  Procedure Laterality Date  . Below knee leg amputation Left   . 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 Esophageal dilation      8-10 yrs ago   . Eye surgery Right   . Av fistula placement Left 12/31/2012    Procedure: ARTERIOVENOUS (AV) FISTULA CREATION-LEFT;  Surgeon: Angelia Mould, MD;  Location: [facility];  Service: Vascular;  Laterality: Left;  . Fistulogram N/A 07/27/2014    Procedure: FISTULOGRAM;  Surgeon: Angelia Mould, MD;  Location[facility] CATH LAB;  Service: Cardiovascular;  Laterality: N/A;  . Av fistula placement Right 11/03/2014    Procedure: INSERTION OF RIGHT ARM  ARTERIOVENOUS (AV) GORE-TEX GRAFT;  Surgeon: Judeth Cornfield  Scot Dock, MD;  Location: [facility];  Service: Vascular;  Laterality: Right;    History   Social History  . Marital Status: Married    Spouse Name: N/A  . Number of Children: N/A  . Years of Education: N/A   Occupational History  . Not on file.   Social History Main Topics  . Smoking status: Former Smoker    Types: Cigarettes    Quit date: 04/17/1998  . Smokeless tobacco: Never Used  . Alcohol Use: 0.0 oz/week    0 Standard drinks or equivalent per week     Comment: rare  . Drug Use: No  . Sexual Activity: Not on file   Other Topics Concern  . Not on file   Social History  Narrative     Filed Vitals:   11/24/14 0816  BP: 132/80  Pulse: 79  Height: 6\' 1"  (1.854 m)  Weight: 155 lb (70.308 kg)  SpO2: 99%    PHYSICAL EXAM General: NAD, blind Neck: No JVD, no thyromegaly or thyroid nodule.  Lungs: Diminished but clear to auscultation bilaterally with normal respiratory effort. No rales or wheezes. CV: Nondisplaced PMI. Regular rate and rhythm, normal S1/S2, no S3/S4, no murmur. No peripheral edema in right leg. +left leg prosthesis. +left carotid bruit.  Abdomen: Soft, nontender, no distention.  Skin: Intact without lesions or rashes.  Neurologic: Alert and oriented x 3.  Psych: Normal affect. Extremities: Right forearm AV graft incision noted, thrill palpated.  HEENT: Normal.   ECG: Most recent ECG reviewed.      ASSESSMENT AND PLAN: 1. Abnormal nuclear stress test with abnormal ECG: Results noted above. Asymptomatic. Continue aspirin 81 mg and Lipitor 40 mg.  2. Left internal carotid artery stenosis: Carotid Dopplers results noted above (<50%). Continue aspirin 81 mg and Lipitor 40 mg.  3. Essential HTN: Normal today. Will monitor.  Dispo: f/u 1 year.   Kate Sable, M.D., F.A.C.C.

## 2014-11-24 NOTE — Patient Instructions (Signed)
Your physician wants you to follow-up in: 1 year with Dr Koneswaran You will receive a reminder letter in the mail two months in advance. If you don't receive a letter, please call our office to schedule the follow-up appointment.    Your physician recommends that you continue on your current medications as directed. Please refer to the Current Medication list given to you today.     Thank you for choosing [facility] !        

## 2014-12-08 DIAGNOSIS — N186 End stage renal disease: Secondary | ICD-10-CM | POA: Diagnosis not present

## 2014-12-10 DIAGNOSIS — N186 End stage renal disease: Secondary | ICD-10-CM | POA: Diagnosis not present

## 2014-12-10 DIAGNOSIS — Z992 Dependence on renal dialysis: Secondary | ICD-10-CM | POA: Diagnosis not present

## 2014-12-10 DIAGNOSIS — I871 Compression of vein: Secondary | ICD-10-CM | POA: Diagnosis not present

## 2014-12-10 DIAGNOSIS — T82858D Stenosis of vascular prosthetic devices, implants and grafts, subsequent encounter: Secondary | ICD-10-CM | POA: Diagnosis not present

## 2014-12-24 DIAGNOSIS — E11311 Type 2 diabetes mellitus with unspecified diabetic retinopathy with macular edema: Secondary | ICD-10-CM | POA: Diagnosis not present

## 2014-12-24 DIAGNOSIS — N186 End stage renal disease: Secondary | ICD-10-CM | POA: Diagnosis not present

## 2014-12-24 DIAGNOSIS — Z23 Encounter for immunization: Secondary | ICD-10-CM | POA: Diagnosis not present

## 2014-12-24 DIAGNOSIS — I10 Essential (primary) hypertension: Secondary | ICD-10-CM | POA: Diagnosis not present

## 2014-12-24 DIAGNOSIS — Z89512 Acquired absence of left leg below knee: Secondary | ICD-10-CM | POA: Diagnosis not present

## 2015-02-23 DIAGNOSIS — E1342 Other specified diabetes mellitus with diabetic polyneuropathy: Secondary | ICD-10-CM | POA: Diagnosis not present

## 2015-02-23 DIAGNOSIS — B351 Tinea unguium: Secondary | ICD-10-CM | POA: Diagnosis not present

## 2015-05-04 DIAGNOSIS — E11311 Type 2 diabetes mellitus with unspecified diabetic retinopathy with macular edema: Secondary | ICD-10-CM | POA: Diagnosis not present

## 2015-05-04 DIAGNOSIS — Z89512 Acquired absence of left leg below knee: Secondary | ICD-10-CM | POA: Diagnosis not present

## 2015-05-04 DIAGNOSIS — I10 Essential (primary) hypertension: Secondary | ICD-10-CM | POA: Diagnosis not present

## 2015-05-04 DIAGNOSIS — N186 End stage renal disease: Secondary | ICD-10-CM | POA: Diagnosis not present

## 2015-08-03 DIAGNOSIS — N186 End stage renal disease: Secondary | ICD-10-CM | POA: Diagnosis not present

## 2015-08-03 DIAGNOSIS — E11311 Type 2 diabetes mellitus with unspecified diabetic retinopathy with macular edema: Secondary | ICD-10-CM | POA: Diagnosis not present

## 2015-08-03 DIAGNOSIS — I10 Essential (primary) hypertension: Secondary | ICD-10-CM | POA: Diagnosis not present

## 2015-08-03 DIAGNOSIS — Z89512 Acquired absence of left leg below knee: Secondary | ICD-10-CM | POA: Diagnosis not present

## 2015-10-12 DIAGNOSIS — E1342 Other specified diabetes mellitus with diabetic polyneuropathy: Secondary | ICD-10-CM | POA: Diagnosis not present

## 2015-10-12 DIAGNOSIS — B351 Tinea unguium: Secondary | ICD-10-CM | POA: Diagnosis not present

## 2015-11-09 ENCOUNTER — Other Ambulatory Visit: Payer: Self-pay | Admitting: Vascular Surgery

## 2015-11-09 DIAGNOSIS — T82511D Breakdown (mechanical) of surgically created arteriovenous shunt, subsequent encounter: Secondary | ICD-10-CM

## 2015-11-09 DIAGNOSIS — Z01810 Encounter for preprocedural cardiovascular examination: Secondary | ICD-10-CM

## 2015-11-10 ENCOUNTER — Other Ambulatory Visit: Payer: Self-pay | Admitting: Vascular Surgery

## 2015-11-10 DIAGNOSIS — N186 End stage renal disease: Secondary | ICD-10-CM

## 2015-11-10 DIAGNOSIS — T82511D Breakdown (mechanical) of surgically created arteriovenous shunt, subsequent encounter: Secondary | ICD-10-CM

## 2015-11-10 DIAGNOSIS — Z01810 Encounter for preprocedural cardiovascular examination: Secondary | ICD-10-CM

## 2015-11-18 ENCOUNTER — Encounter: Payer: Self-pay | Admitting: Vascular Surgery

## 2015-11-25 ENCOUNTER — Ambulatory Visit: Payer: Non-veteran care | Admitting: Cardiovascular Disease

## 2015-11-25 ENCOUNTER — Ambulatory Visit (HOSPITAL_COMMUNITY)
Admission: RE | Admit: 2015-11-25 | Discharge: 2015-11-25 | Disposition: A | Discharge status: Home / Self Care | Payer: Medicare Other | Source: Ambulatory Visit | Attending: Vascular Surgery | Admitting: Vascular Surgery

## 2015-11-25 ENCOUNTER — Encounter (HOSPITAL_COMMUNITY): Payer: Self-pay

## 2015-11-25 ENCOUNTER — Encounter: Payer: Self-pay | Admitting: Vascular Surgery

## 2015-11-25 ENCOUNTER — Ambulatory Visit (INDEPENDENT_AMBULATORY_CARE_PROVIDER_SITE_OTHER): Payer: Medicare Other | Admitting: Vascular Surgery

## 2015-11-25 VITALS — BP 184/79 | HR 78 | Ht 73.0 in | Wt 166.0 lb

## 2015-11-25 DIAGNOSIS — N186 End stage renal disease: Secondary | ICD-10-CM

## 2015-11-25 DIAGNOSIS — Z01810 Encounter for preprocedural cardiovascular examination: Secondary | ICD-10-CM

## 2015-11-25 DIAGNOSIS — T82511A Breakdown (mechanical) of surgically created arteriovenous shunt, initial encounter: Secondary | ICD-10-CM

## 2015-11-25 NOTE — Progress Notes (Signed)
Patient name: Wayne [name] MRN: [id number] DOB: 11/09/1950 Sex: male  REASON FOR VISIT: Evaluate for new hemodialysis access. Referred by Dr. Posey Pronto.  HPI: Wayne [name] is a 65 y.o. male Who I placed a right forearm AV graft on in July 2016. He was not a candidate for a fistula in the right upper extremity. He had a 4-7 mm PTFE graft placed in the forearm. This occluded recently and he underwent a fistulogram at [facility] and was noted to have diffusely diseased outflow.   I have reviewed the records from [facility]. 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 A right IJ tunneled dialysis catheter was placed. Unfortunately, I do not have the actual films to review but I do have the report from that most recent study. 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 This underwent balloon angioplasty. Long areas of stenosis within the brachial vein and axillary vein were noted.  He is sent for evaluation for new access. The patient dialyzes on Monday Wednesdays and Fridays and they have been using his catheter. However, he states that his fistula has had a good thrill. He denies pain or paresthesias in the right arm. He denies swelling in the right arm.  Past Medical History:  Diagnosis Date  . Anemia of chronic disease   . ESRD (end stage renal disease) ([place])    Hemodialysis since December 2015  . Essential hypertension   . GERD (gastroesophageal reflux disease)    PMH:   . Peripheral vascular disease ([place])   . Renal insufficiency   . Sarcoidosis ([place])   . Type 2 diabetes mellitus ([facility])     Family History  Problem Relation Age of Onset  . Diabetes Mellitus II Mother   . Diabetes Mother     SOCIAL HISTORY: Social History  Substance Use Topics  . Smoking status: Former Smoker    Types: Cigarettes    Quit date: 04/17/1998  . Smokeless tobacco: Never Used  . Alcohol use 0.0 oz/week   Comment: rare    No Known Allergies  Current Outpatient Prescriptions  Medication Sig Dispense Refill  . acetaminophen (TYLENOL) 500 MG tablet Take 500 mg by mouth daily as needed (pain).     Marland Kitchen aspirin EC 81 MG tablet Take 81 mg by mouth at bedtime.     Marland Kitchen atorvastatin (LIPITOR) 40 MG tablet Take 40 mg by mouth at bedtime.     . calcitRIOL (ROCALTROL) 0.25 MCG capsule Take 0.25 mcg by mouth at bedtime.     . cloNIDine (CATAPRES) 0.2 MG tablet Take 0.2 mg by mouth at bedtime.     . insulin glargine (LANTUS) 100 UNIT/ML injection Inject 20 Units into the skin every other day. In the morning    . oxyCODONE-acetaminophen (PERCOCET/ROXICET) 5-325 MG per tablet Take 1 tablet by mouth every 6 (six) hours as needed for moderate pain. 20 tablet 0  . sevelamer carbonate (RENVELA) 800 MG tablet Take 800-1,600 mg by mouth See admin instructions. Take 2 tablets (1600 mg) by mouth with meals and 1 tablet (800 mg) with snacks    . sitaGLIPtin (JANUVIA) 100 MG tablet Take 100 mg by mouth at bedtime.      No current facility-administered medications for this visit.     REVIEW OF SYSTEMS:  [X]  denotes positive finding, [ ]  denotes negative finding Cardiac  Comments:  Chest pain or chest pressure:    Shortness  of breath upon exertion: X   Short of breath when lying flat:    Irregular heart rhythm:        Vascular    Pain in calf, thigh, or hip brought on by ambulation:    Pain in feet at night that wakes you up from your sleep:     Blood clot in your veins:    Leg swelling:         Pulmonary    Oxygen at home:    Productive cough:     Wheezing:         Neurologic    Sudden weakness in arms or legs:     Sudden numbness in arms or legs:     Sudden onset of difficulty speaking or slurred speech:    Temporary loss of vision in one eye:     Problems with dizziness:         Gastrointestinal    Blood in stool:     Vomited blood:         Genitourinary    Burning when urinating:     Blood in  urine:        Psychiatric    Major depression:         Hematologic    Bleeding problems:    Problems with blood clotting too easily:        Skin    Rashes or ulcers:        Constitutional    Fever or chills:      PHYSICAL EXAM: Vitals:   11/25/15 1300 11/25/15 1301  BP: (!) 201/80 (!) 184/79  Pulse: 78   SpO2: 100%   Weight: 166 lb (75.3 kg)   Height: 6\' 1"  (1.854 m)     GENERAL: The patient is a well-nourished male, in no acute distress. The vital signs are documented above. CARDIAC: There is a regular rate and rhythm.  VASCULAR: He does have a good thrill in his right forearm AV graft although it is slightly pulsatile. He has a palpable right radial pulse. He has no right arm swelling. PULMONARY: There is good air exchange bilaterally without wheezing or rales. ABDOMEN: Soft and non-tender with normal pitched bowel sounds.  MUSCULOSKELETAL: There are no major deformities or cyanosis. NEUROLOGIC: No focal weakness or paresthesias are detected. SKIN: There are no ulcers or rashes noted. PSYCHIATRIC: The patient has a normal affect.  DATA:   The results of his most recent fistulogram are described above.  MEDICAL ISSUES:  END-STAGE RENAL DISEASE: His right forearm graft appears to be functioning although it is slightly pulsatile. They have been using his catheter for dialysis. I have recommended that the dialysis center attempted uses right forearm AV graft on Friday. If this is successful then they can continue to use it next week and if it continues to work well arrangements can be made to remove his catheter. If the graft is not working adequately then I would obtain a repeat fistulogram in order to determine if he has a central venous stenosis on the right. If he does have a central venous stenosis on the right and he would not be a candidate for a new right upper arm graft. He has already had a previous brachiocephalic fistula in the left arm and would likely require a  graft in the left arm if he is not a candidate for a graft in the right arm. If he does require a fistulogram on the right than I think it  would be also worth obtaining a central venogram on the left side so that we can no his next best option for access.  HYPERTENSION: The patient's initial blood pressure today was elevated. We repeated this and this was still elevated. We have encouraged the patient to follow up with their primary care physician for management of their blood pressure.   Deitra Mayo [facility] [phone]

## 2015-12-02 ENCOUNTER — Ambulatory Visit: Payer: Non-veteran care | Admitting: Cardiovascular Disease

## 2016-01-11 ENCOUNTER — Ambulatory Visit: Payer: Medicare Other | Admitting: Cardiovascular Disease

## 2016-01-11 ENCOUNTER — Encounter: Payer: Self-pay | Admitting: Cardiovascular Disease

## 2016-01-11 ENCOUNTER — Ambulatory Visit (INDEPENDENT_AMBULATORY_CARE_PROVIDER_SITE_OTHER): Payer: Medicare Other | Admitting: Cardiovascular Disease

## 2016-01-11 VITALS — BP 156/72 | HR 80 | Ht 73.0 in | Wt 167.0 lb

## 2016-01-11 DIAGNOSIS — I6522 Occlusion and stenosis of left carotid artery: Secondary | ICD-10-CM | POA: Diagnosis not present

## 2016-01-11 DIAGNOSIS — R931 Abnormal findings on diagnostic imaging of heart and coronary circulation: Secondary | ICD-10-CM | POA: Diagnosis not present

## 2016-01-11 DIAGNOSIS — I10 Essential (primary) hypertension: Secondary | ICD-10-CM

## 2016-01-11 NOTE — Progress Notes (Signed)
SUBJECTIVE: Pt returns for annual follow up for abnormal stress test. Nuclear MPI study 09/17/14 showed a fixed defect in the apical and mid inferior wall suggestive of prior infarct, but soft tissue attenuation artifact could not entirely be excluded. ECG abnormalities were noted.  Echocardiogram on 09-17-14 demonstrated normal left ventricular systolic function, LVEF 0000000, mild concentric LVH, and grade 1 diastolic dysfunction. There was calcification of both the aortic and mitral valve annulus.  He denies chest pain and shortness of breath.  Said he just ate fried fish and oysters which were salty. Says BP is usually normal.  Review of Systems: As per "subjective", otherwise negative.  No Known Allergies  Current Outpatient Prescriptions  Medication Sig Dispense Refill  . acetaminophen (TYLENOL) 500 MG tablet Take 500 mg by mouth daily as needed (pain).     Marland Kitchen aspirin EC 81 MG tablet Take 81 mg by mouth at bedtime.     Marland Kitchen atorvastatin (LIPITOR) 40 MG tablet Take 40 mg by mouth at bedtime.     . calcitRIOL (ROCALTROL) 0.25 MCG capsule Take 0.25 mcg by mouth at bedtime.     . cloNIDine (CATAPRES) 0.2 MG tablet Take 0.2 mg by mouth at bedtime.     . insulin glargine (LANTUS) 100 UNIT/ML injection Inject 20 Units into the skin every other day. In the morning    . sevelamer carbonate (RENVELA) 800 MG tablet Take 800-1,600 mg by mouth See admin instructions. Take 2 tablets (1600 mg) by mouth with meals and 1 tablet (800 mg) with snacks    . sitaGLIPtin (JANUVIA) 100 MG tablet Take 100 mg by mouth at bedtime.      No current facility-administered medications for this visit.     Past Medical History:  Diagnosis Date  . Anemia of chronic disease   . ESRD (end stage renal disease) ([place])    Hemodialysis since December 2015  . Essential hypertension   . GERD (gastroesophageal reflux disease)    PMH:   . Peripheral vascular disease ([place])   . Renal insufficiency   . Sarcoidosis ([place])    . Type 2 diabetes mellitus ([place])     Past Surgical History:  Procedure Laterality Date  . AMPUTATION OF REPLICATED TOES Right [id number]   3rd, 4th, 5th digits (toes) of right foot  . AV FISTULA PLACEMENT Left 12/31/2012   Procedure: ARTERIOVENOUS (AV) FISTULA CREATION-LEFT;  Surgeon: Angelia Mould, MD;  Location: [facility];  Service: Vascular;  Laterality: Left;  . AV FISTULA PLACEMENT Right 11/03/2014   Procedure: INSERTION OF RIGHT ARM  ARTERIOVENOUS (AV) GORE-TEX GRAFT;  Surgeon: Angelia Mould, MD;  Location: [facility];  Service: Vascular;  Laterality: Right;  . BELOW KNEE LEG AMPUTATION Left   . ESOPHAGEAL DILATION     8-10 yrs ago   . EYE SURGERY Right   . FISTULOGRAM N/A 07/27/2014   Procedure: FISTULOGRAM;  Surgeon: Angelia Mould, MD;  Location: [facility] CATH LAB;  Service: Cardiovascular;  Laterality: N/A;    Social History   Social History  . Marital status: Married    Spouse name: N/A  . Number of children: N/A  . Years of education: N/A   Occupational History  . Not on file.   Social History Main Topics  . Smoking status: Former Smoker    Types: Cigarettes    Quit date: 04/17/1998  . Smokeless tobacco: Never Used  . Alcohol use 0.0 oz/week     Comment: rare  . Drug  use: No  . Sexual activity: Not on file   Other Topics Concern  . Not on file   Social History Narrative  . No narrative on file     Vitals:   01/11/16 1509  BP: (!) 156/72  Pulse: 80  SpO2: 97%  Weight: 167 lb (75.8 kg)  Height: 6\' 1"  (1.854 m)    PHYSICAL EXAM General: NAD Neck: No JVD, no thyromegaly or thyroid nodule.  Lungs: Diminished but clear to auscultation bilaterally with normal respiratory effort. No rales or wheezes. CV: Nondisplaced PMI. Regular rate and rhythm, normal S1/S2, no [id number], soft systolic murmur along LSB. No peripheral edema in right leg. +left leg prosthesis. +left carotid bruit.  Abdomen: Soft, nontender, no distention.  Skin: Intact without  lesions or rashes.  Neurologic: Alert and oriented x 3.  Psych: Normal affect. Extremities: Right forearm AV graft incision noted, thrill palpated.  HEENT: Blind.   ECG: Most recent ECG reviewed.      ASSESSMENT AND PLAN: 1. Abnormal nuclear stress test with abnormal ECG: Results noted above. Asymptomatic. Continue aspirin 81 mg and Lipitor 40 mg.  2. Left internal carotid artery stenosis: Carotid Dopplers showed <50% stenosis 09/17/14. Continue aspirin 81 mg and Lipitor 40 mg. Consider repeating in 2 years.  3. Essential HTN: Elevated but says he just ate a salty meal and said it's usually normal. No changes to meds today.  Dispo: f/u prn.    Kate Sable, M.D., F.A.C.C.

## 2016-01-11 NOTE — Patient Instructions (Signed)
Your physician recommends that you schedule a follow-up appointment in: As Needed  Your physician recommends that you continue on your current medications as directed. Please refer to the Current Medication list given to you today.  Thank you for choosing [facility]!    

## 2016-01-12 NOTE — Addendum Note (Signed)
Addended by: Levonne Hubert on: 01/12/2016 08:58 AM   Modules accepted: Orders

## 2016-02-01 DIAGNOSIS — N186 End stage renal disease: Secondary | ICD-10-CM | POA: Diagnosis not present

## 2016-02-01 DIAGNOSIS — Z89512 Acquired absence of left leg below knee: Secondary | ICD-10-CM | POA: Diagnosis not present

## 2016-02-01 DIAGNOSIS — I10 Essential (primary) hypertension: Secondary | ICD-10-CM | POA: Diagnosis not present

## 2016-02-01 DIAGNOSIS — E11311 Type 2 diabetes mellitus with unspecified diabetic retinopathy with macular edema: Secondary | ICD-10-CM | POA: Diagnosis not present

## 2016-05-11 DIAGNOSIS — H269 Unspecified cataract: Secondary | ICD-10-CM | POA: Diagnosis not present

## 2016-05-11 DIAGNOSIS — H4720 Unspecified optic atrophy: Secondary | ICD-10-CM | POA: Diagnosis not present

## 2016-05-11 DIAGNOSIS — E113553 Type 2 diabetes mellitus with stable proliferative diabetic retinopathy, bilateral: Secondary | ICD-10-CM | POA: Diagnosis not present

## 2016-05-11 DIAGNOSIS — H3411 Central retinal artery occlusion, right eye: Secondary | ICD-10-CM | POA: Diagnosis not present

## 2016-05-19 ENCOUNTER — Encounter: Payer: Self-pay | Admitting: Internal Medicine

## 2016-06-02 DIAGNOSIS — Z992 Dependence on renal dialysis: Secondary | ICD-10-CM | POA: Diagnosis not present

## 2016-06-02 DIAGNOSIS — N186 End stage renal disease: Secondary | ICD-10-CM | POA: Diagnosis not present

## 2016-06-02 DIAGNOSIS — T82868A Thrombosis of vascular prosthetic devices, implants and grafts, initial encounter: Secondary | ICD-10-CM | POA: Diagnosis not present

## 2016-06-02 DIAGNOSIS — I871 Compression of vein: Secondary | ICD-10-CM | POA: Diagnosis not present

## 2016-06-06 DIAGNOSIS — H4720 Unspecified optic atrophy: Secondary | ICD-10-CM | POA: Diagnosis not present

## 2016-06-06 DIAGNOSIS — H2512 Age-related nuclear cataract, left eye: Secondary | ICD-10-CM | POA: Diagnosis not present

## 2016-06-06 DIAGNOSIS — Z961 Presence of intraocular lens: Secondary | ICD-10-CM | POA: Diagnosis not present

## 2016-06-08 DIAGNOSIS — H04123 Dry eye syndrome of bilateral lacrimal glands: Secondary | ICD-10-CM | POA: Diagnosis not present

## 2016-06-08 DIAGNOSIS — Z961 Presence of intraocular lens: Secondary | ICD-10-CM | POA: Diagnosis not present

## 2016-06-21 DIAGNOSIS — H3523 Other non-diabetic proliferative retinopathy, bilateral: Secondary | ICD-10-CM | POA: Diagnosis not present

## 2016-06-21 DIAGNOSIS — H35373 Puckering of macula, bilateral: Secondary | ICD-10-CM | POA: Diagnosis not present

## 2016-06-21 DIAGNOSIS — H3589 Other specified retinal disorders: Secondary | ICD-10-CM | POA: Diagnosis not present

## 2016-06-21 DIAGNOSIS — E113593 Type 2 diabetes mellitus with proliferative diabetic retinopathy without macular edema, bilateral: Secondary | ICD-10-CM | POA: Diagnosis not present

## 2016-06-21 DIAGNOSIS — H04123 Dry eye syndrome of bilateral lacrimal glands: Secondary | ICD-10-CM | POA: Diagnosis not present

## 2016-08-15 DIAGNOSIS — E113593 Type 2 diabetes mellitus with proliferative diabetic retinopathy without macular edema, bilateral: Secondary | ICD-10-CM | POA: Diagnosis not present

## 2016-08-15 DIAGNOSIS — H401111 Primary open-angle glaucoma, right eye, mild stage: Secondary | ICD-10-CM | POA: Diagnosis not present

## 2016-08-22 DIAGNOSIS — Z89512 Acquired absence of left leg below knee: Secondary | ICD-10-CM | POA: Diagnosis not present

## 2016-08-22 DIAGNOSIS — E11311 Type 2 diabetes mellitus with unspecified diabetic retinopathy with macular edema: Secondary | ICD-10-CM | POA: Diagnosis not present

## 2016-08-22 DIAGNOSIS — I10 Essential (primary) hypertension: Secondary | ICD-10-CM | POA: Diagnosis not present

## 2016-08-22 DIAGNOSIS — N186 End stage renal disease: Secondary | ICD-10-CM | POA: Diagnosis not present

## 2016-09-15 ENCOUNTER — Encounter (INDEPENDENT_AMBULATORY_CARE_PROVIDER_SITE_OTHER): Payer: Self-pay | Admitting: *Deleted

## 2016-09-22 DIAGNOSIS — E1342 Other specified diabetes mellitus with diabetic polyneuropathy: Secondary | ICD-10-CM | POA: Diagnosis not present

## 2016-09-22 DIAGNOSIS — B351 Tinea unguium: Secondary | ICD-10-CM | POA: Diagnosis not present

## 2016-10-03 DIAGNOSIS — H401111 Primary open-angle glaucoma, right eye, mild stage: Secondary | ICD-10-CM | POA: Diagnosis not present

## 2016-10-16 ENCOUNTER — Other Ambulatory Visit (INDEPENDENT_AMBULATORY_CARE_PROVIDER_SITE_OTHER): Payer: Self-pay | Admitting: *Deleted

## 2016-10-16 DIAGNOSIS — Z800 Family history of malignant neoplasm of digestive organs: Secondary | ICD-10-CM

## 2016-10-16 DIAGNOSIS — Z1211 Encounter for screening for malignant neoplasm of colon: Secondary | ICD-10-CM | POA: Insufficient documentation

## 2016-12-26 DIAGNOSIS — E1165 Type 2 diabetes mellitus with hyperglycemia: Secondary | ICD-10-CM | POA: Diagnosis not present

## 2016-12-26 DIAGNOSIS — N186 End stage renal disease: Secondary | ICD-10-CM | POA: Diagnosis not present

## 2016-12-26 DIAGNOSIS — Z23 Encounter for immunization: Secondary | ICD-10-CM | POA: Diagnosis not present

## 2016-12-26 DIAGNOSIS — E230 Hypopituitarism: Secondary | ICD-10-CM | POA: Diagnosis not present

## 2016-12-26 DIAGNOSIS — E46 Unspecified protein-calorie malnutrition: Secondary | ICD-10-CM | POA: Diagnosis not present

## 2016-12-26 DIAGNOSIS — I10 Essential (primary) hypertension: Secondary | ICD-10-CM | POA: Diagnosis not present

## 2017-01-15 ENCOUNTER — Encounter (INDEPENDENT_AMBULATORY_CARE_PROVIDER_SITE_OTHER): Payer: Self-pay | Admitting: *Deleted

## 2017-01-15 ENCOUNTER — Telehealth (INDEPENDENT_AMBULATORY_CARE_PROVIDER_SITE_OTHER): Payer: Self-pay | Admitting: *Deleted

## 2017-01-15 NOTE — Telephone Encounter (Signed)
Patient needs trilyte 

## 2017-01-16 MED ORDER — PEG 3350-KCL-NA BICARB-NACL 420 G PO SOLR: 4000.0000 mL | Freq: Once | ORAL | 0 refills | Status: AC

## 2017-01-30 ENCOUNTER — Telehealth (INDEPENDENT_AMBULATORY_CARE_PROVIDER_SITE_OTHER): Payer: Self-pay | Admitting: *Deleted

## 2017-01-30 NOTE — Telephone Encounter (Signed)
agree

## 2017-01-30 NOTE — Telephone Encounter (Signed)
Referring MD/PCP: fanta   Procedure: tcs  Reason/Indication:  Screening, fam hx colon ca  Has patient had this procedure before?  Yes, 10 yrs ago  If so, when, by whom and where?    Is there a family history of colon cancer?  Yes, mother  Who?  What age when diagnosed?    Is patient diabetic?   yes      Does patient have prosthetic heart valve or mechanical valve?  no  Do you have a pacemaker?  no  Has patient ever had endocarditis? no  Has patient had joint replacement within last 12 months?  no  Is patient constipated or take laxatives? no  Does patient have a history of alcohol/drug use?  no  Is patient on Coumadin, Plavix and/or Aspirin? yes  Medications: asa 81 mg daily, januvia 100 mg daily, atorvastatin 80 mg 1/2 tab daily, clonidine 0.2 mg bid, insulin 20 units every other day, sevelamer 800 mg 2 tab bid  Allergies: nkda  Medication Adjustment per Dr Laural Golden: asa 2 days, don't take diabetic medicine evening before or morning of proceudre  Procedure date & time: 02/22/17 at 830

## 2017-02-22 ENCOUNTER — Encounter (HOSPITAL_COMMUNITY): Payer: Self-pay | Admitting: *Deleted

## 2017-02-22 ENCOUNTER — Other Ambulatory Visit: Payer: Self-pay

## 2017-02-22 ENCOUNTER — Ambulatory Visit (HOSPITAL_COMMUNITY)
Admission: RE | Admit: 2017-02-22 | Discharge: 2017-02-22 | Disposition: A | Discharge status: Home Health | Payer: Medicare Other | Source: Ambulatory Visit | Attending: Internal Medicine | Admitting: Internal Medicine

## 2017-02-22 ENCOUNTER — Encounter (HOSPITAL_COMMUNITY)
Admission: RE | Disposition: A | Discharge status: Home Health | Payer: Self-pay | Source: Ambulatory Visit | Attending: Internal Medicine

## 2017-02-22 DIAGNOSIS — D125 Benign neoplasm of sigmoid colon: Secondary | ICD-10-CM | POA: Insufficient documentation

## 2017-02-22 DIAGNOSIS — K219 Gastro-esophageal reflux disease without esophagitis: Secondary | ICD-10-CM | POA: Insufficient documentation

## 2017-02-22 DIAGNOSIS — E1151 Type 2 diabetes mellitus with diabetic peripheral angiopathy without gangrene: Secondary | ICD-10-CM | POA: Diagnosis not present

## 2017-02-22 DIAGNOSIS — Z79899 Other long term (current) drug therapy: Secondary | ICD-10-CM | POA: Diagnosis not present

## 2017-02-22 DIAGNOSIS — E7800 Pure hypercholesterolemia, unspecified: Secondary | ICD-10-CM | POA: Insufficient documentation

## 2017-02-22 DIAGNOSIS — K644 Residual hemorrhoidal skin tags: Secondary | ICD-10-CM | POA: Insufficient documentation

## 2017-02-22 DIAGNOSIS — D124 Benign neoplasm of descending colon: Secondary | ICD-10-CM | POA: Diagnosis not present

## 2017-02-22 DIAGNOSIS — Z87891 Personal history of nicotine dependence: Secondary | ICD-10-CM | POA: Insufficient documentation

## 2017-02-22 DIAGNOSIS — Z7982 Long term (current) use of aspirin: Secondary | ICD-10-CM | POA: Insufficient documentation

## 2017-02-22 DIAGNOSIS — D123 Benign neoplasm of transverse colon: Secondary | ICD-10-CM | POA: Insufficient documentation

## 2017-02-22 DIAGNOSIS — Z1211 Encounter for screening for malignant neoplasm of colon: Secondary | ICD-10-CM | POA: Diagnosis not present

## 2017-02-22 DIAGNOSIS — N186 End stage renal disease: Secondary | ICD-10-CM | POA: Diagnosis not present

## 2017-02-22 DIAGNOSIS — D122 Benign neoplasm of ascending colon: Secondary | ICD-10-CM | POA: Diagnosis not present

## 2017-02-22 DIAGNOSIS — Z992 Dependence on renal dialysis: Secondary | ICD-10-CM | POA: Diagnosis not present

## 2017-02-22 DIAGNOSIS — Z7984 Long term (current) use of oral hypoglycemic drugs: Secondary | ICD-10-CM | POA: Insufficient documentation

## 2017-02-22 DIAGNOSIS — D869 Sarcoidosis, unspecified: Secondary | ICD-10-CM | POA: Diagnosis not present

## 2017-02-22 DIAGNOSIS — E1122 Type 2 diabetes mellitus with diabetic chronic kidney disease: Secondary | ICD-10-CM | POA: Diagnosis not present

## 2017-02-22 DIAGNOSIS — Z794 Long term (current) use of insulin: Secondary | ICD-10-CM | POA: Insufficient documentation

## 2017-02-22 DIAGNOSIS — I120 Hypertensive chronic kidney disease with stage 5 chronic kidney disease or end stage renal disease: Secondary | ICD-10-CM | POA: Insufficient documentation

## 2017-02-22 DIAGNOSIS — Z800 Family history of malignant neoplasm of digestive organs: Secondary | ICD-10-CM | POA: Diagnosis not present

## 2017-02-22 HISTORY — PX: COLONOSCOPY: SHX5424

## 2017-02-22 HISTORY — DX: Pure hypercholesterolemia, unspecified: E78.00

## 2017-02-22 LAB — GLUCOSE, CAPILLARY: Glucose-Capillary: 142 mg/dL — ABNORMAL HIGH (ref 65–99)

## 2017-02-22 SURGERY — COLONOSCOPY
Anesthesia: Moderate Sedation

## 2017-02-22 MED ORDER — FENTANYL CITRATE (PF) 100 MCG/2ML IJ SOLN
INTRAMUSCULAR | Status: DC | PRN
  Administered 2017-02-22 (×2): 25 ug via INTRAVENOUS

## 2017-02-22 MED ORDER — MEPERIDINE HCL 50 MG/ML IJ SOLN
INTRAMUSCULAR | Status: AC
  Filled 2017-02-22: qty 1

## 2017-02-22 MED ORDER — STERILE WATER FOR IRRIGATION IR SOLN
Status: DC | PRN
  Administered 2017-02-22: 100 mL

## 2017-02-22 MED ORDER — MIDAZOLAM HCL 5 MG/5ML IJ SOLN
INTRAMUSCULAR | Status: AC
  Filled 2017-02-22: qty 10

## 2017-02-22 MED ORDER — FENTANYL CITRATE (PF) 100 MCG/2ML IJ SOLN
INTRAMUSCULAR | Status: AC
  Filled 2017-02-22: qty 2

## 2017-02-22 MED ORDER — MIDAZOLAM HCL 5 MG/5ML IJ SOLN
INTRAMUSCULAR | Status: DC | PRN
  Administered 2017-02-22: 1 mg via INTRAVENOUS
  Administered 2017-02-22: 2 mg via INTRAVENOUS
  Administered 2017-02-22 (×2): 1 mg via INTRAVENOUS

## 2017-02-22 MED ORDER — ENALAPRILAT 1.25 MG/ML IV SOLN
INTRAVENOUS | Status: AC
  Filled 2017-02-22: qty 2

## 2017-02-22 MED ORDER — SODIUM CHLORIDE 0.9 % IV SOLN
INTRAVENOUS | Status: DC
  Administered 2017-02-22: 08:00:00 via INTRAVENOUS

## 2017-02-22 NOTE — Discharge Instructions (Signed)
Resume aspirin on 02/24/2017. Resume other medications as before. Resume usual diet. Physician will call with biopsy results.    Colonoscopy, Adult, Care After This sheet gives you information about how to care for yourself after your procedure. Your doctor may also give you more specific instructions. If you have problems or questions, call your doctor. Follow these instructions at home: General instructions   For the first 24 hours after the procedure: ? Do not drive or use machinery. ? Do not sign important documents. ? Do not drink alcohol. ? Do your daily activities more slowly than normal. ? Eat foods that are soft and easy to digest. ? Rest often.  Take over-the-counter or prescription medicines only as told by your doctor.  It is up to you to get the results of your procedure. Ask your doctor, or the department performing the procedure, when your results will be ready. To help cramping and bloating:  Try walking around.  Put heat on your belly (abdomen) as told by your doctor. Use a heat source that your doctor recommends, such as a moist heat pack or a heating pad. ? Put a towel between your skin and the heat source. ? Leave the heat on for 20-30 minutes. ? Remove the heat if your skin turns bright red. This is especially important if you cannot feel pain, heat, or cold. You can get burned. Eating and drinking  Drink enough fluid to keep your pee (urine) clear or pale yellow.  Return to your normal diet as told by your doctor. Avoid heavy or fried foods that are hard to digest.  Avoid drinking alcohol for as long as told by your doctor. Contact a doctor if:  You have blood in your poop (stool) 2-3 days after the procedure. Get help right away if:  You have more than a small amount of blood in your poop.  You see large clumps of tissue (blood clots) in your poop.  Your belly is swollen.  You feel sick to your stomach (nauseous).  You throw up  (vomit).  You have a fever.  You have belly pain that gets worse, and medicine does not help your pain. This information is not intended to replace advice given to you by your health care provider. Make sure you discuss any questions you have with your health care provider.   Colon Polyps Polyps are tissue growths inside the body. Polyps can grow in many places, including the large intestine (colon). A polyp may be a round bump or a mushroom-shaped growth. You could have one polyp or several. Most colon polyps are noncancerous (benign). However, some colon polyps can become cancerous over time. What are the causes? The exact cause of colon polyps is not known. What increases the risk? This condition is more likely to develop in people who:  Have a family history of colon cancer or colon polyps.  Are older than 66 or older than 45 if they are African American.  Have inflammatory bowel disease, such as ulcerative colitis or Crohn disease.  Are overweight.  Smoke cigarettes.  Do not get enough exercise.  Drink too much alcohol.  Eat a diet that is: ? High in fat and red meat. ? Low in fiber.  Had childhood cancer that was treated with abdominal radiation.  What are the signs or symptoms? Most polyps do not cause symptoms. If you have symptoms, they may include:  Blood coming from your rectum when having a bowel movement.  Blood in your  stool.The stool may look dark red or black.  A change in bowel habits, such as constipation or diarrhea.  How is this diagnosed? This condition is diagnosed with a colonoscopy. This is a procedure that uses a lighted, flexible scope to look at the inside of your colon. How is this treated? Treatment for this condition involves removing any polyps that are found. Those polyps will then be tested for cancer. If cancer is found, your health care provider will talk to you about options for colon cancer treatment. Follow these instructions at  home: Diet  Eat plenty of fiber, such as fruits, vegetables, and whole grains.  Eat foods that are high in calcium and vitamin D, such as milk, cheese, yogurt, eggs, liver, fish, and broccoli.  Limit foods high in fat, red meats, and processed meats, such as hot dogs, sausage, bacon, and lunch meats.  Maintain a healthy weight, or lose weight if recommended by your health care provider. General instructions  Do not smoke cigarettes.  Do not drink alcohol excessively.  Keep all follow-up visits as told by your health care provider. This is important. This includes keeping regularly scheduled colonoscopies. Talk to your health care provider about when you need a colonoscopy.  Exercise every day or as told by your health care provider. Contact a health care provider if:  You have new or worsening bleeding during a bowel movement.  You have new or increased blood in your stool.  You have a change in bowel habits.  You unexpectedly lose weight. This information is not intended to replace advice given to you by your health care provider. Make sure you discuss any questions you have with your health care provider.

## 2017-02-22 NOTE — H&P (Signed)
Wayne [name] is an 66 y.o. male.   Chief Complaint: Patient is here for colonoscopy. HPI: Patient 66 year old Afro-American male who is here for screening colonoscopy.  Last exam was normal about 10 years ago.  He denies abdominal pain change in bowel habits or rectal bleeding. Family history is significant for colon carcinoma in his mother at late onset.  She was 67 years old at the time of diagnosis and died within a few months.  Past Medical History:  Diagnosis Date  . Anemia of chronic disease   . ESRD (end stage renal disease) ([place])    Hemodialysis since December 2015  . Essential hypertension   . GERD (gastroesophageal reflux disease)    PMH:   . Hypercholesteremia   . Peripheral vascular disease ([place])   . Renal insufficiency   . Sarcoidosis   . Type 2 diabetes mellitus ([place])     Past Surgical History:  Procedure Laterality Date  . AMPUTATION OF REPLICATED TOES Right 1245   3rd, 4th, 5th digits (toes) of right foot  . BELOW KNEE LEG AMPUTATION Left   . COLONOSCOPY    . ESOPHAGEAL DILATION     8-10 yrs ago   . EYE SURGERY Right     Family History  Problem Relation Age of Onset  . Diabetes Mellitus II Mother   . Diabetes Mother   . Colon cancer Mother    Social History:  reports that he quit smoking about 18 years ago. His smoking use included cigarettes. He has quit using smokeless tobacco. He reports that he drinks alcohol. He reports that he does not use drugs.  Allergies: No Known Allergies  Medications Prior to Admission  Medication Sig Dispense Refill  . acetaminophen (TYLENOL) 500 MG tablet Take 500 mg by mouth daily as needed (pain).     Marland Kitchen amLODipine (NORVASC) 10 MG tablet Take 10 mg daily by mouth.    Marland Kitchen aspirin EC 81 MG tablet Take 81 mg by mouth at bedtime.     Marland Kitchen atorvastatin (LIPITOR) 80 MG tablet Take 40 mg daily by mouth.    . sevelamer carbonate (RENVELA) 800 MG tablet Take 800-1,600 mg by mouth See admin instructions. Take 2 tablets (1600 mg) by mouth  with meals and 1 tablet (800 mg) with snacks    . sitaGLIPtin (JANUVIA) 100 MG tablet Take 100 mg by mouth at bedtime.     . insulin glargine (LANTUS) 100 UNIT/ML injection Inject 20 Units into the skin every other day. In the morning      Results for orders placed or performed during the hospital encounter of 02/22/17 (from the past 48 hour(s))  Glucose, capillary     Status: Abnormal   Collection Time: 02/22/17  7:55 AM  Result Value Ref Range   Glucose-Capillary 142 (H) 65 - 99 mg/dL   No results found.  ROS  Blood pressure (!) 188/81, pulse 63, temperature 98.4 F (36.9 C), temperature source Oral, resp. rate 16, height 6\' 1"  (1.854 m), weight 172 lb (78 kg), SpO2 99 %. Physical Exam  Constitutional: He appears well-developed and well-nourished.  HENT:  Mouth/Throat: Oropharynx is clear and moist.  Eyes: Conjunctivae are normal. No scleral icterus.  Neck: No thyromegaly present.  Cardiovascular: Normal rate, regular rhythm and normal heart sounds.  Faint systolic ejection murmur noted at left sternal border.  Respiratory: Effort normal and breath sounds normal.  GI: Soft. He exhibits no distension and no mass. There is no tenderness.  Musculoskeletal: He exhibits no  edema.  Patient has AV fistula in right forearm.  Lymphadenopathy:    He has no cervical adenopathy.  Neurological: He is alert.  Skin: Skin is warm and dry.     Assessment/Plan Average risk screening colonoscopy.  Hildred Laser, MD 02/22/2017, 8:22 AM

## 2017-02-22 NOTE — Op Note (Signed)
[facility] Patient Name: Wayne [name] Procedure Date: 02/22/2017 8:09 AM MRN: [id number] Date of Birth: 1950/07/29 Attending MD: Hildred Laser , MD CSN: [id number] Age: 66 Admit Type: Outpatient Procedure:                Colonoscopy Indications:              Screening for colorectal malignant neoplasm Providers:                Hildred Laser, MD, Janeece Riggers, RN, Rosina Lowenstein, RN Referring MD:             Rosita Fire, MD Medicines:                Fentanyl 50 micrograms IV, Midazolam 5 mg IV and                            Enalapril 6.44 mg IV Complications:            No immediate complications. Estimated Blood Loss:     Estimated blood loss was minimal. Procedure:                Pre-Anesthesia Assessment:                           - Prior to the procedure, a History and Physical                            was performed, and patient medications and                            allergies were reviewed. The patient's tolerance of                            previous anesthesia was also reviewed. The risks                            and benefits of the procedure and the sedation                            options and risks were discussed with the patient.                            All questions were answered, and informed consent                            was obtained. Prior Anticoagulants: The patient                            last took aspirin 2 days prior to the procedure.                            ASA Grade Assessment: III - A patient with severe                            systemic disease. After reviewing the risks and  benefits, the patient was deemed in satisfactory                            condition to undergo the procedure.                           After obtaining informed consent, the colonoscope                            was passed under direct vision. Throughout the                            procedure, the patient's blood pressure, pulse,  and                            oxygen saturations were monitored continuously. The                            EC-[id number] ([id number]) scope was introduced through                            the anus and advanced to the the cecum, identified                            by appendiceal orifice and ileocecal valve. The                            colonoscopy was performed without difficulty. The                            patient tolerated the procedure well. The quality                            of the bowel preparation was good. The ileocecal                            valve, appendiceal orifice, and rectum were                            photographed. Scope In: 8:33:34 AM Scope Out: 9:05:00 AM Scope Withdrawal Time: 0 hours 16 minutes 22 seconds  Total Procedure Duration: 0 hours 31 minutes 26 seconds  Findings:      The perianal and digital rectal examinations were normal.      A 5 mm polyp was found in the hepatic flexure. The polyp was sessile.       The polyp was removed with a cold snare. Resection and retrieval were       complete. The pathology specimen was placed into Bottle Number 1.      Five sessile polyps were found in the sigmoid colon, descending colon,       splenic flexure, hepatic flexure and ascending colon. The polyps were       small in size. These were biopsied with a cold forceps for histology.       The pathology specimen was placed into Bottle Number 1.  External hemorrhoids were found during retroflexion. The hemorrhoids       were small. Impression:               - One 5 mm polyp at the hepatic flexure, removed                            with a cold snare. Resected and retrieved.                           - Five small polyps in the sigmoid colon, in the                            descending colon, at the splenic flexure, at the                            hepatic flexure and in the ascending colon.                            Biopsied.                           -  External hemorrhoids. Moderate Sedation:      Moderate (conscious) sedation was administered by the endoscopy nurse       and supervised by the endoscopist. The following parameters were       monitored: oxygen saturation, heart rate, blood pressure, CO2       capnography and response to care. Total physician intraservice time was       36 minutes. Recommendation:           - Patient has a contact number available for                            emergencies. The signs and symptoms of potential                            delayed complications were discussed with the                            patient. Return to normal activities tomorrow.                            Written discharge instructions were provided to the                            patient.                           - Resume previous diet today.                           - Continue present medications.                           - No aspirin, ibuprofen, naproxen, or other  non-steroidal anti-inflammatory drugs for 2 days.                           - Await pathology results.                           - Repeat colonoscopy date to be determined after                            pending pathology results are reviewed. Procedure Code(s):        --- Professional ---                           [phone], Colonoscopy, flexible; with removal of                            tumor(s), polyp(s), or other lesion(s) by snare                            technique                           45380, 59, Colonoscopy, flexible; with biopsy,                            single or multiple                           99152, Moderate sedation services provided by the                            same physician or other qualified health care                            professional performing the diagnostic or                            therapeutic service that the sedation supports,                            requiring the presence of an  independent trained                            observer to assist in the monitoring of the                            patient's level of consciousness and physiological                            status; initial 15 minutes of intraservice time,                            patient age 19 years or older                           [phone], Moderate  sedation services; each additional                            15 minutes intraservice time Diagnosis Code(s):        --- Professional ---                           Z12.11, Encounter for screening for malignant                            neoplasm of colon                           D12.3, Benign neoplasm of transverse colon (hepatic                            flexure or splenic flexure)                           D12.5, Benign neoplasm of sigmoid colon                           D12.4, Benign neoplasm of descending colon                           D12.2, Benign neoplasm of ascending colon                           K64.4, Residual hemorrhoidal skin tags CPT copyright 2016 American Medical Association. All rights reserved. The codes documented in this report are preliminary and upon coder review may  be revised to meet current compliance requirements. Hildred Laser, MD Hildred Laser, MD 02/22/2017 9:16:39 AM This report has been signed electronically. Number of Addenda: 0

## 2017-02-27 ENCOUNTER — Encounter (HOSPITAL_COMMUNITY): Payer: Self-pay | Admitting: Internal Medicine

## 2017-03-14 ENCOUNTER — Emergency Department (HOSPITAL_COMMUNITY)
Admission: EM | Admit: 2017-03-14 | Discharge: 2017-03-14 | Disposition: A | Discharge status: Home / Self Care | Payer: Medicare Other | Attending: Emergency Medicine | Admitting: Emergency Medicine

## 2017-03-14 ENCOUNTER — Other Ambulatory Visit: Payer: Self-pay

## 2017-03-14 ENCOUNTER — Encounter (HOSPITAL_COMMUNITY): Payer: Self-pay | Admitting: *Deleted

## 2017-03-14 ENCOUNTER — Emergency Department (HOSPITAL_COMMUNITY): Payer: Medicare Other

## 2017-03-14 DIAGNOSIS — R42 Dizziness and giddiness: Secondary | ICD-10-CM | POA: Diagnosis not present

## 2017-03-14 DIAGNOSIS — Z79899 Other long term (current) drug therapy: Secondary | ICD-10-CM | POA: Diagnosis not present

## 2017-03-14 DIAGNOSIS — Z87891 Personal history of nicotine dependence: Secondary | ICD-10-CM | POA: Insufficient documentation

## 2017-03-14 DIAGNOSIS — E119 Type 2 diabetes mellitus without complications: Secondary | ICD-10-CM | POA: Diagnosis not present

## 2017-03-14 DIAGNOSIS — I120 Hypertensive chronic kidney disease with stage 5 chronic kidney disease or end stage renal disease: Secondary | ICD-10-CM | POA: Diagnosis not present

## 2017-03-14 DIAGNOSIS — Z794 Long term (current) use of insulin: Secondary | ICD-10-CM | POA: Insufficient documentation

## 2017-03-14 DIAGNOSIS — I10 Essential (primary) hypertension: Secondary | ICD-10-CM

## 2017-03-14 DIAGNOSIS — Z992 Dependence on renal dialysis: Secondary | ICD-10-CM | POA: Insufficient documentation

## 2017-03-14 DIAGNOSIS — Z7982 Long term (current) use of aspirin: Secondary | ICD-10-CM | POA: Diagnosis not present

## 2017-03-14 DIAGNOSIS — N186 End stage renal disease: Secondary | ICD-10-CM | POA: Insufficient documentation

## 2017-03-14 DIAGNOSIS — E7800 Pure hypercholesterolemia, unspecified: Secondary | ICD-10-CM | POA: Diagnosis not present

## 2017-03-14 DIAGNOSIS — R51 Headache: Secondary | ICD-10-CM | POA: Diagnosis not present

## 2017-03-14 LAB — CBC
HCT: 34.5 % — ABNORMAL LOW (ref 39.0–52.0)
Hemoglobin: 11.2 g/dL — ABNORMAL LOW (ref 13.0–17.0)
MCH: 30.8 pg (ref 26.0–34.0)
MCHC: 32.5 g/dL (ref 30.0–36.0)
MCV: 94.8 fL (ref 78.0–100.0)
PLATELETS: 147 10*3/uL — AB (ref 150–400)
RBC: 3.64 MIL/uL — AB (ref 4.22–5.81)
RDW: 14.1 % (ref 11.5–15.5)
WBC: 4.3 10*3/uL (ref 4.0–10.5)

## 2017-03-14 LAB — BASIC METABOLIC PANEL
Anion gap: 8 (ref 5–15)
BUN: 21 mg/dL — AB (ref 6–20)
CALCIUM: 8 mg/dL — AB (ref 8.9–10.3)
CO2: 29 mmol/L (ref 22–32)
Chloride: 97 mmol/L — ABNORMAL LOW (ref 101–111)
Creatinine, Ser: 7.61 mg/dL — ABNORMAL HIGH (ref 0.61–1.24)
GFR calc Af Amer: 8 mL/min — ABNORMAL LOW (ref >60)
GFR, EST NON AFRICAN AMERICAN: 7 mL/min — AB (ref >60)
GLUCOSE: 196 mg/dL — AB (ref 65–99)
POTASSIUM: 5 mmol/L (ref 3.5–5.1)
SODIUM: 134 mmol/L — AB (ref 135–145)

## 2017-03-14 MED ORDER — MECLIZINE HCL 25 MG PO TABS: 25.0000 mg | ORAL_TABLET | Freq: Two times a day (BID) | ORAL | 0 refills | Status: DC

## 2017-03-14 NOTE — ED Provider Notes (Signed)
[facility] EMERGENCY DEPARTMENT Provider Note   CSN: [id number] Arrival date & time: 03/14/17  1909     History   Chief Complaint Chief Complaint  Patient presents with  . Hypertension    HPI Wayne [name] is a 66 y.o. male.  Patient is a dialysis patient.  Normally dialyzed Monday Wednesdays and Saturdays patient was at dialysis today.  They noted that his blood pressure was elevated.  Patient went home rechecked his blood pressure still elevated and complaint of headache and some slight dizziness with may be a mild component of vertigo.  No other complaints.  Patient has long-standing visual changes.  But not significantly worse in the last day or so.      Past Medical History:  Diagnosis Date  . Anemia of chronic disease   . ESRD (end stage renal disease) ([place])    Hemodialysis since December 2015  . Essential hypertension   . GERD (gastroesophageal reflux disease)    PMH:   . Hypercholesteremia   . Peripheral vascular disease ([place])   . Renal insufficiency   . Sarcoidosis   . Type 2 diabetes mellitus [facility])     Patient Active Problem List   Diagnosis Date Noted  . Special screening for malignant neoplasms, colon 10/16/2016  . Family history of colon cancer 10/16/2016  . End stage renal disease ([place]) 02/12/2013  . Chronic kidney disease, stage IV (severe) ([place]) 12/11/2012  . Anemia 11/18/2010    Past Surgical History:  Procedure Laterality Date  . AMPUTATION OF REPLICATED TOES Right 6295   3rd, 4th, 5th digits (toes) of right foot  . AV FISTULA PLACEMENT Left 12/31/2012   Procedure: ARTERIOVENOUS (AV) FISTULA CREATION-LEFT;  Surgeon: Angelia Mould, MD;  Location: [place];  Service: Vascular;  Laterality: Left;  . AV FISTULA PLACEMENT Right 11/03/2014   Procedure: INSERTION OF RIGHT ARM  ARTERIOVENOUS (AV) GORE-TEX GRAFT;  Surgeon: Angelia Mould, MD;  Location: [place];  Service: Vascular;  Laterality: Right;  . BELOW KNEE LEG AMPUTATION Left   .  COLONOSCOPY    . COLONOSCOPY N/A 02/22/2017   Procedure: COLONOSCOPY;  Surgeon: Rogene Houston, MD;  [facility];  Service: Endoscopy;  Laterality: N/A;  830  . ESOPHAGEAL DILATION     8-10 yrs ago   . EYE SURGERY Right   . FISTULOGRAM N/A 07/27/2014   Procedure: FISTULOGRAM;  Surgeon: Angelia Mould, MD;  Location[facility] LAB;  Service: Cardiovascular;  Laterality: N/A;       Home Medications    Prior to Admission medications   Medication Sig Start Date End Date Taking? Authorizing Provider  acetaminophen (TYLENOL) 500 MG tablet Take 500 mg by mouth daily as needed (pain).    Yes [provider]  aspirin EC 81 MG tablet Take 1 tablet (81 mg total) at bedtime by mouth. 02/24/17  Yes Rehman, Mechele Dawley, MD  atorvastatin (LIPITOR) 80 MG tablet Take 40 mg daily by mouth.   Yes [provider]  cloNIDine (CATAPRES - DOSED IN MG/24 HR) 0.2 mg/24hr patch Place 0.2 mg onto the skin once a week.  02/28/17  Yes [provider]  insulin glargine (LANTUS) 100 UNIT/ML injection Inject 20 Units into the skin every other day. In the morning   Yes [provider]  sevelamer carbonate (RENVELA) 800 MG tablet Take 800-1,600 mg by mouth See admin instructions. Take 2 tablets (1600 mg) by mouth with meals and 1 tablet (800 mg) with snacks  Yes [provider]  sitaGLIPtin (JANUVIA) 100 MG tablet Take 100 mg by mouth at bedtime.    Yes [provider]  timolol (TIMOPTIC) 0.5 % ophthalmic solution Place 1 drop into both eyes 2 (two) times daily. 02/13/17  Yes [provider]  meclizine (ANTIVERT) 25 MG tablet Take 1 tablet (25 mg total) by mouth 2 (two) times daily. 03/14/17   Fredia Sorrow, MD    Family History Family History  Problem Relation Age of Onset  . Diabetes Mellitus II Mother   . Diabetes Mother   . Colon cancer Mother     Social History Social History   Tobacco Use  . Smoking status: Former Smoker     Types: Cigarettes    Last attempt to quit: 04/17/1998    Years since quitting: 18.9  . Smokeless tobacco: Former Network engineer Use Topics  . Alcohol use: Yes    Alcohol/week: 0.0 oz    Comment: rare  . Drug use: No     Allergies   Patient has no known allergies.   Review of Systems Review of Systems  Constitutional: Negative for fever.  HENT: Negative for congestion.   Eyes: Positive for visual disturbance.  Respiratory: Negative for shortness of breath.   Cardiovascular: Negative for chest pain.  Gastrointestinal: Negative for abdominal pain.  Musculoskeletal: Negative for back pain.  Skin: Negative for rash.  Neurological: Positive for dizziness and headaches.  Hematological: Does not bruise/bleed easily.  Psychiatric/Behavioral: Negative for confusion.     Physical Exam Updated Vital Signs BP (!) 188/81   Pulse 65   Temp 98.6 F (37 C) (Oral)   Resp 17   Ht 1.854 m (6\' 1" )   Wt 78 kg (172 lb)   SpO2 91%   BMI 22.69 kg/m   Physical Exam  Constitutional: He is oriented to person, place, and time. He appears well-developed and well-nourished. No distress.  HENT:  Head: Normocephalic and atraumatic.  Mouth/Throat: Oropharynx is clear and moist.  Eyes: Conjunctivae and EOM are normal. Pupils are equal, round, and reactive to light.  Neck: Normal range of motion. Neck supple.  Cardiovascular: Normal rate, regular rhythm and normal heart sounds.  Pulmonary/Chest: Effort normal and breath sounds normal.  Abdominal: Soft. Bowel sounds are normal. There is no tenderness.  Musculoskeletal: Normal range of motion.  AV fistula right upper arm good thrill.  Neurological: He is alert and oriented to person, place, and time. No cranial nerve deficit or sensory deficit. He exhibits normal muscle tone. Coordination normal.  Skin: Skin is warm.  Nursing note and vitals reviewed.    ED Treatments / Results  Labs (all labs ordered are listed, but only abnormal results  are displayed) Labs Reviewed  CBC - Abnormal; Notable for the following components:      Result Value   RBC 3.64 (*)    Hemoglobin 11.2 (*)    HCT 34.5 (*)    Platelets 147 (*)    All other components within normal limits  BASIC METABOLIC PANEL - Abnormal; Notable for the following components:   Sodium 134 (*)    Chloride 97 (*)    Glucose, Bld 196 (*)    BUN 21 (*)    Creatinine, Ser 7.61 (*)    Calcium 8.0 (*)    GFR calc non Af Amer 7 (*)    GFR calc Af Amer 8 (*)    All other components within normal limits    EKG  EKG Interpretation  None       Radiology Mr Brain Wo Contrast (neuro Protocol)  Result Date: 03/14/2017 CLINICAL DATA:  Ataxia and headache after dialysis.  Hypertension. EXAM: MRI HEAD WITHOUT CONTRAST TECHNIQUE: Multiplanar, multiecho pulse sequences of the brain and surrounding structures were obtained without intravenous contrast. COMPARISON:  None. FINDINGS: Brain: No acute infarction, hemorrhage, hydrocephalus, extra-axial collection or mass lesion. Moderate signal abnormality in the cerebral white matter, mainly periventricular. Patient's vascular risk factors suggests that this is chronic small vessel ischemia. Remote lacunar infarct in the right thalamus. Small superficial signal abnormality in the right parasagittal cerebellum that is likely also ischemic. Calcification or remote microhemorrhage along the atrium of the left lateral ventricle and in the left cerebral white matter. Vascular: Major flow voids are preserved Skull and upper cervical spine: Negative for marrow lesion. Sinuses/Orbits: Secretions in the small left sphenoid sinus. Mild mucosal thickening along the floor of the right maxillary sinus. Right cataract resection. IMPRESSION: 1. No acute finding including infarct. 2. Moderate chronic small vessel ischemia. 3. Inspissated secretions opacify the small left sphenoid sinus. Electronically Signed   By: Monte Fantasia M.D.   On: 03/14/2017 21:37      Procedures Procedures (including critical care time)  Medications Ordered in ED Medications - No data to display   Initial Impression / Assessment and Plan / ED Course  I have reviewed the triage vital signs and the nursing notes.  Pertinent labs & imaging results that were available during my care of the patient were reviewed by me and considered in my medical decision making (see chart for details).    Patient's MRI was negative for evidence of stroke or tumor.  Vertigo-like symptoms are very mild.  Treated with Antivert.  Patient stable for discharge home and follow-up.  Patient on cardiac monitor no arrhythmias.  Patient symptoms very minimal.   Final Clinical Impressions(s) / ED Diagnoses   Final diagnoses:  Essential hypertension  Dizziness    ED Discharge Orders        Ordered    meclizine (ANTIVERT) 25 MG tablet  2 times daily     03/14/17 2344       Fredia Sorrow, MD 03/16/17 1710

## 2017-03-14 NOTE — Discharge Instructions (Signed)
Follow-up with your kidney doctors for better blood pressure control.  The dizziness here today evaluated with MRI which was negative.  Trial of meclizine.  Return for any new or worse symptoms.

## 2017-03-14 NOTE — ED Notes (Signed)
Patient states that he is not having any pain at this time. States that he feels a little dizzy and congested. Patients 02 sat reading 90-92.

## 2017-03-14 NOTE — ED Triage Notes (Signed)
Pt states that he was at dialysis today and staff noticed that his blood pressure was elevated, pt went home, rechecked his blood pressure and it was still elevated with headache and dizziness that started after he got home between 4-4:30.

## 2017-03-27 DIAGNOSIS — I10 Essential (primary) hypertension: Secondary | ICD-10-CM | POA: Diagnosis not present

## 2017-03-27 DIAGNOSIS — Z1389 Encounter for screening for other disorder: Secondary | ICD-10-CM | POA: Diagnosis not present

## 2017-03-27 DIAGNOSIS — E1165 Type 2 diabetes mellitus with hyperglycemia: Secondary | ICD-10-CM | POA: Diagnosis not present

## 2017-03-27 DIAGNOSIS — N186 End stage renal disease: Secondary | ICD-10-CM | POA: Diagnosis not present

## 2017-03-27 DIAGNOSIS — Z0001 Encounter for general adult medical examination with abnormal findings: Secondary | ICD-10-CM | POA: Diagnosis not present

## 2017-03-27 DIAGNOSIS — Z89512 Acquired absence of left leg below knee: Secondary | ICD-10-CM | POA: Diagnosis not present

## 2017-04-03 DIAGNOSIS — E113593 Type 2 diabetes mellitus with proliferative diabetic retinopathy without macular edema, bilateral: Secondary | ICD-10-CM | POA: Diagnosis not present

## 2017-04-03 DIAGNOSIS — H401111 Primary open-angle glaucoma, right eye, mild stage: Secondary | ICD-10-CM | POA: Diagnosis not present

## 2017-06-29 ENCOUNTER — Other Ambulatory Visit: Payer: Self-pay

## 2017-06-29 ENCOUNTER — Emergency Department (HOSPITAL_COMMUNITY): Payer: Medicare Other

## 2017-06-29 ENCOUNTER — Encounter (HOSPITAL_COMMUNITY): Payer: Self-pay | Admitting: Emergency Medicine

## 2017-06-29 ENCOUNTER — Observation Stay (HOSPITAL_COMMUNITY)
Admission: EM | Admit: 2017-06-29 | Discharge: 2017-06-30 | Disposition: A | Discharge status: Home / Self Care | Payer: Medicare Other | Attending: Internal Medicine | Admitting: Internal Medicine

## 2017-06-29 DIAGNOSIS — E119 Type 2 diabetes mellitus without complications: Secondary | ICD-10-CM | POA: Insufficient documentation

## 2017-06-29 DIAGNOSIS — R0902 Hypoxemia: Secondary | ICD-10-CM

## 2017-06-29 DIAGNOSIS — Z9115 Patient's noncompliance with renal dialysis: Secondary | ICD-10-CM

## 2017-06-29 DIAGNOSIS — N185 Chronic kidney disease, stage 5: Secondary | ICD-10-CM

## 2017-06-29 DIAGNOSIS — R9431 Abnormal electrocardiogram [ECG] [EKG]: Secondary | ICD-10-CM | POA: Diagnosis not present

## 2017-06-29 DIAGNOSIS — Z992 Dependence on renal dialysis: Secondary | ICD-10-CM | POA: Diagnosis not present

## 2017-06-29 DIAGNOSIS — E8770 Fluid overload, unspecified: Secondary | ICD-10-CM | POA: Diagnosis present

## 2017-06-29 DIAGNOSIS — J9601 Acute respiratory failure with hypoxia: Secondary | ICD-10-CM | POA: Diagnosis present

## 2017-06-29 DIAGNOSIS — R0600 Dyspnea, unspecified: Principal | ICD-10-CM | POA: Insufficient documentation

## 2017-06-29 DIAGNOSIS — Z91158 Patient's noncompliance with renal dialysis for other reason: Secondary | ICD-10-CM

## 2017-06-29 DIAGNOSIS — Z87891 Personal history of nicotine dependence: Secondary | ICD-10-CM | POA: Diagnosis not present

## 2017-06-29 DIAGNOSIS — N186 End stage renal disease: Secondary | ICD-10-CM | POA: Insufficient documentation

## 2017-06-29 DIAGNOSIS — R079 Chest pain, unspecified: Secondary | ICD-10-CM | POA: Diagnosis present

## 2017-06-29 DIAGNOSIS — I120 Hypertensive chronic kidney disease with stage 5 chronic kidney disease or end stage renal disease: Secondary | ICD-10-CM | POA: Insufficient documentation

## 2017-06-29 DIAGNOSIS — Z79899 Other long term (current) drug therapy: Secondary | ICD-10-CM | POA: Insufficient documentation

## 2017-06-29 DIAGNOSIS — Z7982 Long term (current) use of aspirin: Secondary | ICD-10-CM | POA: Diagnosis not present

## 2017-06-29 DIAGNOSIS — D649 Anemia, unspecified: Secondary | ICD-10-CM | POA: Diagnosis present

## 2017-06-29 DIAGNOSIS — E1122 Type 2 diabetes mellitus with diabetic chronic kidney disease: Secondary | ICD-10-CM | POA: Diagnosis present

## 2017-06-29 LAB — CBC WITH DIFFERENTIAL/PLATELET
BASOS ABS: 0 10*3/uL (ref 0.0–0.1)
Basophils Relative: 0 %
Eosinophils Absolute: 0.2 10*3/uL (ref 0.0–0.7)
Eosinophils Relative: 2 %
HEMATOCRIT: 29.7 % — AB (ref 39.0–52.0)
HEMOGLOBIN: 9.6 g/dL — AB (ref 13.0–17.0)
Lymphocytes Relative: 5 %
Lymphs Abs: 0.4 10*3/uL — ABNORMAL LOW (ref 0.7–4.0)
MCH: 30 pg (ref 26.0–34.0)
MCHC: 32.3 g/dL (ref 30.0–36.0)
MCV: 92.8 fL (ref 78.0–100.0)
MONO ABS: 0.5 10*3/uL (ref 0.1–1.0)
Monocytes Relative: 7 %
NEUTROS ABS: 6.6 10*3/uL (ref 1.7–7.7)
Neutrophils Relative %: 86 %
Platelets: 170 10*3/uL (ref 150–400)
RBC: 3.2 MIL/uL — ABNORMAL LOW (ref 4.22–5.81)
RDW: 14.3 % (ref 11.5–15.5)
WBC: 7.6 10*3/uL (ref 4.0–10.5)

## 2017-06-29 LAB — BASIC METABOLIC PANEL
ANION GAP: 19 — AB (ref 5–15)
BUN: 62 mg/dL — ABNORMAL HIGH (ref 6–20)
CHLORIDE: 103 mmol/L (ref 101–111)
CO2: 21 mmol/L — AB (ref 22–32)
Calcium: 9.3 mg/dL (ref 8.9–10.3)
Creatinine, Ser: 14.91 mg/dL — ABNORMAL HIGH (ref 0.61–1.24)
GFR calc Af Amer: 3 mL/min — ABNORMAL LOW (ref >60)
GFR calc non Af Amer: 3 mL/min — ABNORMAL LOW (ref >60)
GLUCOSE: 185 mg/dL — AB (ref 65–99)
Potassium: 4.4 mmol/L (ref 3.5–5.1)
Sodium: 143 mmol/L (ref 135–145)

## 2017-06-29 LAB — GLUCOSE, CAPILLARY
GLUCOSE-CAPILLARY: 222 mg/dL — AB (ref 65–99)
Glucose-Capillary: 147 mg/dL — ABNORMAL HIGH (ref 65–99)

## 2017-06-29 LAB — TROPONIN I
TROPONIN I: 0.1 ng/mL — AB (ref <0.03)
Troponin I: 0.04 ng/mL (ref <0.03)

## 2017-06-29 LAB — BRAIN NATRIURETIC PEPTIDE: B Natriuretic Peptide: 773 pg/mL — ABNORMAL HIGH (ref 0.0–100.0)

## 2017-06-29 LAB — MRSA PCR SCREENING: MRSA by PCR: NEGATIVE

## 2017-06-29 MED ORDER — PENTAFLUOROPROP-TETRAFLUOROETH EX AERO: 1.0000 "application " | INHALATION_SPRAY | CUTANEOUS | Status: DC | PRN

## 2017-06-29 MED ORDER — CLONIDINE HCL 0.2 MG/24HR TD PTWK
0.2000 mg | MEDICATED_PATCH | TRANSDERMAL | Status: DC
Start: 2017-07-06 — End: 2017-06-30

## 2017-06-29 MED ORDER — ALTEPLASE 2 MG IJ SOLR
2.0000 mg | Freq: Once | INTRAMUSCULAR | Status: DC | PRN
  Filled 2017-06-29: qty 2

## 2017-06-29 MED ORDER — ONDANSETRON HCL 4 MG/2ML IJ SOLN: 4.0000 mg | Freq: Four times a day (QID) | INTRAMUSCULAR | Status: DC | PRN

## 2017-06-29 MED ORDER — HEPARIN SODIUM (PORCINE) 1000 UNIT/ML DIALYSIS
1000.0000 [IU] | INTRAMUSCULAR | Status: DC | PRN
  Filled 2017-06-29: qty 1

## 2017-06-29 MED ORDER — INSULIN GLARGINE 100 UNIT/ML ~~LOC~~ SOLN
20.0000 [IU] | SUBCUTANEOUS | Status: DC
  Administered 2017-06-30: 20 [IU] via SUBCUTANEOUS
  Filled 2017-06-29 (×3): qty 0.2

## 2017-06-29 MED ORDER — SEVELAMER CARBONATE 800 MG PO TABS
1600.0000 mg | ORAL_TABLET | Freq: Three times a day (TID) | ORAL | Status: DC
  Administered 2017-06-29: 1600 mg via ORAL
  Filled 2017-06-29 (×2): qty 2

## 2017-06-29 MED ORDER — SODIUM CHLORIDE 0.9 % IV SOLN: 100.0000 mL | INTRAVENOUS | Status: DC | PRN

## 2017-06-29 MED ORDER — LIDOCAINE-PRILOCAINE 2.5-2.5 % EX CREA: 1.0000 "application " | TOPICAL_CREAM | CUTANEOUS | Status: DC | PRN

## 2017-06-29 MED ORDER — TIMOLOL MALEATE 0.5 % OP SOLN
1.0000 [drp] | Freq: Two times a day (BID) | OPHTHALMIC | Status: DC
  Administered 2017-06-29 – 2017-06-30 (×2): 1 [drp] via OPHTHALMIC
  Filled 2017-06-29: qty 5

## 2017-06-29 MED ORDER — ASPIRIN EC 81 MG PO TBEC
81.0000 mg | DELAYED_RELEASE_TABLET | Freq: Every day | ORAL | Status: DC
  Administered 2017-06-29: 81 mg via ORAL
  Filled 2017-06-29: qty 1

## 2017-06-29 MED ORDER — ACETAMINOPHEN 325 MG PO TABS: 650.0000 mg | ORAL_TABLET | Freq: Four times a day (QID) | ORAL | Status: DC | PRN

## 2017-06-29 MED ORDER — ONDANSETRON HCL 4 MG PO TABS
4.0000 mg | ORAL_TABLET | Freq: Four times a day (QID) | ORAL | Status: DC | PRN
Start: 2017-06-29 — End: 2017-06-30

## 2017-06-29 MED ORDER — LIDOCAINE HCL (PF) 1 % IJ SOLN: 5.0000 mL | INTRAMUSCULAR | Status: DC | PRN

## 2017-06-29 MED ORDER — ACETAMINOPHEN 650 MG RE SUPP
650.0000 mg | Freq: Four times a day (QID) | RECTAL | Status: DC | PRN
Start: 2017-06-29 — End: 2017-06-30

## 2017-06-29 MED ORDER — LINAGLIPTIN 5 MG PO TABS
5.0000 mg | ORAL_TABLET | Freq: Every day | ORAL | Status: DC
  Administered 2017-06-29 – 2017-06-30 (×2): 5 mg via ORAL
  Filled 2017-06-29 (×2): qty 1

## 2017-06-29 MED ORDER — INSULIN ASPART 100 UNIT/ML ~~LOC~~ SOLN
0.0000 [IU] | Freq: Every day | SUBCUTANEOUS | Status: DC
  Administered 2017-06-29: 2 [IU] via SUBCUTANEOUS

## 2017-06-29 MED ORDER — INSULIN ASPART 100 UNIT/ML ~~LOC~~ SOLN
0.0000 [IU] | Freq: Three times a day (TID) | SUBCUTANEOUS | Status: DC
  Administered 2017-06-29: 1 [IU] via SUBCUTANEOUS
  Administered 2017-06-30: 3 [IU] via SUBCUTANEOUS

## 2017-06-29 MED ORDER — SEVELAMER CARBONATE 800 MG PO TABS: 800.0000 mg | ORAL_TABLET | Freq: Three times a day (TID) | ORAL | Status: DC | PRN

## 2017-06-29 MED ORDER — EPOETIN ALFA 3000 UNIT/ML IJ SOLN
3000.0000 [IU] | INTRAMUSCULAR | Status: DC
Start: 2017-06-29 — End: 2017-06-30
  Administered 2017-06-29: 3000 [IU] via SUBCUTANEOUS
  Filled 2017-06-29 (×2): qty 1

## 2017-06-29 MED ORDER — HEPARIN SODIUM (PORCINE) 5000 UNIT/ML IJ SOLN
5000.0000 [IU] | Freq: Three times a day (TID) | INTRAMUSCULAR | Status: DC
  Administered 2017-06-29 – 2017-06-30 (×2): 5000 [IU] via SUBCUTANEOUS
  Filled 2017-06-29: qty 1

## 2017-06-29 MED ORDER — ATORVASTATIN CALCIUM 40 MG PO TABS
40.0000 mg | ORAL_TABLET | Freq: Every day | ORAL | Status: DC
  Administered 2017-06-29 – 2017-06-30 (×2): 40 mg via ORAL
  Filled 2017-06-29: qty 1

## 2017-06-29 NOTE — Consult Note (Signed)
Reason for Consult: Fluid overload and end-stage renal disease Referring Physician: Dr. Marya Fossa [name] is an 67 y.o. male.  HPI: He is a patient who has history of hypertension, diabetes, sarcoidosis and end-stage renal disease on maintenance hemodialysis presently was sent from dialysis unit because of chest pain and difficulty breathing.  Patient regularly being dialyzed Monday Wednesday Friday.  He came on Monday but missed his dialysis on Wednesday because of ophthalmology appointment.  This morning when he wakes up he was found to have difficulty breathing and some left-sided chest pain described as tightness.  Presently he is feeling much better.  He denies any nausea or vomiting.  Past Medical History:  Diagnosis Date  . Anemia of chronic disease   . ESRD (end stage renal disease) ([place])    Hemodialysis since December 2015  . Essential hypertension   . GERD (gastroesophageal reflux disease)    PMH:   . Hypercholesteremia   . Peripheral vascular disease ([place])   . Renal insufficiency   . Sarcoidosis   . Type 2 diabetes mellitus ([place])     Past Surgical History:  Procedure Laterality Date  . AMPUTATION OF REPLICATED TOES Right 3474   3rd, 4th, 5th digits (toes) of right foot  . AV FISTULA PLACEMENT Left 12/31/2012   Procedure: ARTERIOVENOUS (AV) FISTULA CREATION-LEFT;  Surgeon: Angelia Mould, MD;  Location: [place];  Service: Vascular;  Laterality: Left;  . AV FISTULA PLACEMENT Right 11/03/2014   Procedure: INSERTION OF RIGHT ARM  ARTERIOVENOUS (AV) GORE-TEX GRAFT;  Surgeon: Angelia Mould, MD;  Location: [place];  Service: Vascular;  Laterality: Right;  . BELOW KNEE LEG AMPUTATION Left   . COLONOSCOPY    . COLONOSCOPY N/A 02/22/2017   Procedure: COLONOSCOPY;  Surgeon: Rogene Houston, MD;  [facility];  Service: Endoscopy;  Laterality: N/A;  830  . ESOPHAGEAL DILATION     8-10 yrs ago   . EYE SURGERY Right   . FISTULOGRAM N/A 07/27/2014   Procedure:  FISTULOGRAM;  Surgeon: Angelia Mould, MD;  Location[facility] CATH LAB;  Service: Cardiovascular;  Laterality: N/A;    Family History  Problem Relation Age of Onset  . Diabetes Mellitus II Mother   . Diabetes Mother   . Colon cancer Mother     Social History:  reports that he quit smoking about 19 years ago. His smoking use included cigarettes. He has quit using smokeless tobacco. He reports that he does not drink alcohol or use drugs.  Allergies: No Known Allergies  Medications: I have reviewed the patient's current medications.  Results for orders placed or performed during the hospital encounter of 06/29/17 (from the past 48 hour(s))  Brain natriuretic peptide     Status: Abnormal   Collection Time: 06/29/17  8:04 AM  Result Value Ref Range   B Natriuretic Peptide 773.0 (H) 0.0 - 100.0 pg/mL    Comment: Performed at [facility], [address] [place], [place] [address]  CBC with Differential     Status: Abnormal   Collection Time: 06/29/17  8:04 AM  Result Value Ref Range   WBC 7.6 4.0 - 10.5 K/uL   RBC 3.20 (L) 4.22 - 5.81 MIL/uL   Hemoglobin 9.6 (L) 13.0 - 17.0 g/dL   HCT 29.7 (L) 39.0 - 52.0 %   MCV 92.8 78.0 - 100.0 fL   MCH 30.0 26.0 - 34.0 pg   MCHC 32.3 30.0 - 36.0 g/dL   RDW 14.3 11.5 -  15.5 %   Platelets 170 150 - 400 K/uL   Neutrophils Relative % 86 %   Neutro Abs 6.6 1.7 - 7.7 K/uL   Lymphocytes Relative 5 %   Lymphs Abs 0.4 (L) 0.7 - 4.0 K/uL   Monocytes Relative 7 %   Monocytes Absolute 0.5 0.1 - 1.0 K/uL   Eosinophils Relative 2 %   Eosinophils Absolute 0.2 0.0 - 0.7 K/uL   Basophils Relative 0 %   Basophils Absolute 0.0 0.0 - 0.1 K/uL    Comment: Performed at [facility], [address] [place], [place] [address]    No results found.  Review of Systems  Constitutional: Negative for chills and fever.  HENT: Positive for congestion.   Respiratory: Positive for shortness of breath. Negative for cough and hemoptysis.   Cardiovascular: Positive  for chest pain and orthopnea.  Gastrointestinal: Negative for nausea and vomiting.  Neurological: Positive for weakness.   Blood pressure (!) 165/70, pulse 87, temperature 97.7 F (36.5 C), temperature source Oral, resp. rate (!) 28, height 6\' 1"  (1.854 m), weight 78 kg (172 lb), SpO2 95 %. Physical Exam  Constitutional: He is oriented to person, place, and time. No distress.  Neck: No JVD present.  Cardiovascular: Normal rate and regular rhythm.  Respiratory: No respiratory distress. He has wheezes. He has rales.  GI: He exhibits no distension. There is no tenderness.  Musculoskeletal: He exhibits no edema.  Neurological: He is alert and oriented to person, place, and time.    Assessment/Plan: 1] difficulty breathing: This is a combination of high salt and fluid intake and also missing his dialysis.  Presently patient does not have any chest pain.  He is on oxygen and his breathing is much better. 2] hypertension: His blood pressure is slightly high but stable 3] end-stage renal disease: Presently patient denies any nausea or vomiting. 4] anemia: His hemoglobin is below target goal 5] diabetes 6] bone and mineral disorder: Calcium is range 7] history of sarcoidosis. Plan: 1] we will dialyze patient for 4 hours 2] will use 2K/2.5 calcium bath and remove about 3 1/2 L if his blood pressure tolerates. 3] we will check his renal panel and CBC in the morning. 4] patient advised not to miss his dialysis and also to cut his salt and fluid intake.  Lorely Bubb S 06/29/2017, 9:08 AM

## 2017-06-29 NOTE — ED Notes (Signed)
Dialysis nurse in room to do hemodialysis

## 2017-06-29 NOTE — ED Notes (Signed)
CRITICAL VALUE ALERT  Critical Value:  Trop 0.10  Date & Time Notied:  06/29/17 1137  Provider Notified: Mauro Kaufmann  Orders Received/Actions taken: NA

## 2017-06-29 NOTE — ED Notes (Signed)
EDP made aware of pt's sats

## 2017-06-29 NOTE — ED Notes (Signed)
Date and time results received: 06/29/17 0918  (use smartphrase ".now" to insert current time)  Test: Troponin Critical Value: 0.04  Name of Provider Notified: Zavitz  Orders Received? Or Actions Taken?: Orders Received - See Orders for details

## 2017-06-29 NOTE — Progress Notes (Signed)
**Note De-identified Wayne [name] Obfuscation** EKG complete and placed in patient chart 

## 2017-06-29 NOTE — ED Notes (Signed)
O2 increased to 5 L/M

## 2017-06-29 NOTE — ED Triage Notes (Signed)
Pt brought by EMS from home with chest pain since 5 am this morning. Pt states he has chest pain in the middle of his chest that is a level 3 on a scale of 0 to 10. Pt states the pain feels like pressure when he inspires. Pt had dialysis yesterday 06/28/2017 at 7pm and started having chest pain. Dialysis was stopped. EMS gave pt "4 baby aspirin". Pt is not normally on oxygen at home, however oxygen saturation is in 80s. Pt on 4 L of oxygen at this time with sat at 90%

## 2017-06-29 NOTE — ED Provider Notes (Addendum)
[facility] EMERGENCY DEPARTMENT Provider Note   CSN: [id number] Arrival date & time: 06/29/17  5361     History   Chief Complaint Chief Complaint  Patient presents with  . Chest Pain    HPI Wayne [name] is a 67 y.o. male.  Patient presents with chest pain since 5:00 this morning. Patient's main concern right now is shortness of breath and he has no chest pain. Patient went to dialysis and because he was having worsening symptoms she was sent over for further evaluation. Patient missed dialysis on Wednesday and last dialyzed on Monday. Patient's been on dialysis for 5 years. Patient has end-stage renal disease, diabetes and high blood pressure. Patient denies blood clot history or fevers.      Past Medical History:  Diagnosis Date  . Anemia of chronic disease   . ESRD (end stage renal disease) ([place])    Hemodialysis since December 2015  . Essential hypertension   . GERD (gastroesophageal reflux disease)    PMH:   . Hypercholesteremia   . Peripheral vascular disease ([place])   . Renal insufficiency   . Sarcoidosis   . Type 2 diabetes mellitus [facility])     Patient Active Problem List   Diagnosis Date Noted  . Fluid overload 06/29/2017  . Special screening for malignant neoplasms, colon 10/16/2016  . Family history of colon cancer 10/16/2016  . End stage renal disease ([place]) 02/12/2013  . Chronic kidney disease, stage IV (severe) ([place]) 12/11/2012  . Anemia 11/18/2010    Past Surgical History:  Procedure Laterality Date  . AMPUTATION OF REPLICATED TOES Right 4431   3rd, 4th, 5th digits (toes) of right foot  . AV FISTULA PLACEMENT Left 12/31/2012   Procedure: ARTERIOVENOUS (AV) FISTULA CREATION-LEFT;  Surgeon: Angelia Mould, MD;  Location: [place];  Service: Vascular;  Laterality: Left;  . AV FISTULA PLACEMENT Right 11/03/2014   Procedure: INSERTION OF RIGHT ARM  ARTERIOVENOUS (AV) GORE-TEX GRAFT;  Surgeon: Angelia Mould, MD;  Location: [place];  Service:  Vascular;  Laterality: Right;  . BELOW KNEE LEG AMPUTATION Left   . COLONOSCOPY    . COLONOSCOPY N/A 02/22/2017   Procedure: COLONOSCOPY;  Surgeon: Rogene Houston, MD;  [facility];  Service: Endoscopy;  Laterality: N/A;  830  . ESOPHAGEAL DILATION     8-10 yrs ago   . EYE SURGERY Right   . FISTULOGRAM N/A 07/27/2014   Procedure: FISTULOGRAM;  Surgeon: Angelia Mould, MD;  Location: [facility] CATH LAB;  Service: Cardiovascular;  Laterality: N/A;       Home Medications    Prior to Admission medications   Medication Sig Start Date End Date Taking? Authorizing Provider  acetaminophen (TYLENOL) 500 MG tablet Take 500 mg by mouth daily as needed (pain).     [provider]  aspirin EC 81 MG tablet Take 1 tablet (81 mg total) at bedtime by mouth. 02/24/17   Rehman, Mechele Dawley, MD  atorvastatin (LIPITOR) 80 MG tablet Take 40 mg daily by mouth.    [provider]  cloNIDine (CATAPRES - DOSED IN MG/24 HR) 0.2 mg/24hr patch Place 0.2 mg onto the skin once a week.  02/28/17   [provider]  insulin glargine (LANTUS) 100 UNIT/ML injection Inject 20 Units into the skin every other day. In the morning    [provider]  meclizine (ANTIVERT) 25 MG tablet Take 1 tablet (25 mg total) by mouth 2 (two) times daily. 03/14/17   Fredia Sorrow,  MD  sevelamer carbonate (RENVELA) 800 MG tablet Take 800-1,600 mg by mouth See admin instructions. Take 2 tablets (1600 mg) by mouth with meals and 1 tablet (800 mg) with snacks    [provider]  sitaGLIPtin (JANUVIA) 100 MG tablet Take 100 mg by mouth at bedtime.     [provider]  timolol (TIMOPTIC) 0.5 % ophthalmic solution Place 1 drop into both eyes 2 (two) times daily. 02/13/17   [provider]    Family History Family History  Problem Relation Age of Onset  . Diabetes Mellitus II Mother   . Diabetes Mother   . Colon cancer Mother     Social History Social History    Tobacco Use  . Smoking status: Former Smoker    Types: Cigarettes    Last attempt to quit: 04/17/1998    Years since quitting: 19.2  . Smokeless tobacco: Former Network engineer Use Topics  . Alcohol use: No    Alcohol/week: 0.0 oz    Frequency: Never    Comment: rare  . Drug use: No     Allergies   Patient has no known allergies.   Review of Systems Review of Systems  Constitutional: Negative for chills and fever.  HENT: Negative for congestion.   Eyes: Negative for visual disturbance.  Respiratory: Positive for shortness of breath.   Cardiovascular: Positive for chest pain (anterior initially, resolved, primarily shortness of breath).  Gastrointestinal: Negative for abdominal pain and vomiting.  Genitourinary: Negative for dysuria and flank pain.  Musculoskeletal: Negative for back pain, neck pain and neck stiffness.  Skin: Negative for rash.  Neurological: Negative for light-headedness and headaches.     Physical Exam Updated Vital Signs BP (!) 165/70   Pulse 87   Temp 97.7 F (36.5 C) (Oral)   Resp (!) 28   Ht 6\' 1"  (1.854 m)   Wt 78 kg (172 lb)   SpO2 95%   BMI 22.69 kg/m   Physical Exam  Constitutional: He is oriented to person, place, and time. He appears well-developed and well-nourished.  HENT:  Head: Normocephalic and atraumatic.  Eyes: Conjunctivae are normal. Right eye exhibits no discharge. Left eye exhibits no discharge.  Neck: Normal range of motion. Neck supple. No tracheal deviation present.  Cardiovascular: Normal rate and regular rhythm.  Pulmonary/Chest: Tachypnea noted. He has rales in the right lower field and the left lower field.  Abdominal: Soft. He exhibits no distension. There is no tenderness. There is no guarding.  Musculoskeletal: He exhibits no edema.  Neurological: He is alert and oriented to person, place, and time.  Skin: Skin is warm. No rash noted.  Psychiatric: He has a normal mood and affect.  Nursing note and vitals  reviewed.    ED Treatments / Results  Labs (all labs ordered are listed, but only abnormal results are displayed) Labs Reviewed  BRAIN NATRIURETIC PEPTIDE - Abnormal; Notable for the following components:      Result Value   B Natriuretic Peptide 773.0 (*)    All other components within normal limits  BASIC METABOLIC PANEL - Abnormal; Notable for the following components:   CO2 21 (*)    Glucose, Bld 185 (*)    BUN 62 (*)    Creatinine, Ser 14.91 (*)    GFR calc non Af Amer 3 (*)    GFR calc Af Amer 3 (*)    Anion gap 19 (*)    All other components within normal limits  CBC  WITH DIFFERENTIAL/PLATELET - Abnormal; Notable for the following components:   RBC 3.20 (*)    Hemoglobin 9.6 (*)    HCT 29.7 (*)    Lymphs Abs 0.4 (*)    All other components within normal limits  TROPONIN I - Abnormal; Notable for the following components:   Troponin I 0.04 (*)    All other components within normal limits  HEPATITIS B SURFACE ANTIGEN  TROPONIN I  OCCULT BLOOD X 1 CARD TO LAB, STOOL    EKG  EKG Interpretation  Date/Time:  Friday June 29 2017 07:39:18 EDT Ventricular Rate:  88 PR Interval:    QRS Duration: 92 QT Interval:  411 QTC Calculation: 498 R Axis:   93 Text Interpretation:  Sinus rhythm Consider left atrial enlargement Right axis deviation Repol abnrm, severe global ischemia (LM/MVD) Confirmed by Elnora Morrison [phone]) on 06/29/2017 7:49:54 AM       Radiology Dg Chest 2 View  Result Date: 06/29/2017 CLINICAL DATA:  Shortness of breath and left-sided chest pain. EXAM: CHEST - 2 VIEW COMPARISON:  Chest x-ray dated May 14, 2012. FINDINGS: The heart size and mediastinal contours are within normal limits. Calcified mediastinal and supraclavicular lymph nodes are again noted. Vascular congestion with diffusely increased interstitial markings, greater on the right. Hazy opacification in the right lower lobe. Small right pleural effusion. No acute osseous abnormality.  IMPRESSION: 1. Vascular congestion with asymmetric right greater than left interstitial edema. Hazy opacification in the right lower lobe could reflect asymmetric alveolar edema versus infiltrate. 2. Small right pleural effusion. Electronically Signed   By: Titus Dubin M.D.   On: 06/29/2017 09:07    Procedures .Critical Care Performed by: Elnora Morrison, MD Authorized by: Elnora Morrison, MD   Critical care provider statement:    Critical care time (minutes):  75   Critical care start time:  06/29/2017 8:00 AM   Critical care end time:  06/29/2017 9:15 AM   Critical care time was exclusive of:  Separately billable procedures and treating other patients and teaching time   Critical care was necessary to treat or prevent imminent or life-threatening deterioration of the following conditions:  Renal failure   Critical care was time spent personally by me on the following activities:  Examination of patient, re-evaluation of patient's condition, review of old charts and discussions with consultants   I assumed direction of critical care for this patient from another provider in my specialty: no      (including critical care time)  Medications Ordered in ED Medications  epoetin alfa (EPOGEN,PROCRIT) injection 3,000 Units (not administered)     Initial Impression / Assessment and Plan / ED Course  I have reviewed the triage vital signs and the nursing notes.  Pertinent labs & imaging results that were available during my care of the patient were reviewed by me and considered in my medical decision making (see chart for details).    Patient presents with worsening dyspnea and hypoxia requiring 4 L nasal cannula. Patient has crackles at the bases and missed dialysis concern primarily for fluid overload secondarily. Patient had aspirin prior to arrival. Discuss with nephrology for consult for emergent dialysis. Blood work pending and will discuss with hospitalist for admission.  Discussed  with nephrology for emergent dialysis. Patient stabilized on 4 L nasal cannula. Blood work reviewed potassium normal mild decrease in hemoglobin plan for Hemoccult testing. Plan for telemetry admission.Hospitalist agrees with plan.  While waiting dialysis O2 needs increased to 5 L, plan for  bipap prn.   The patients results and plan were reviewed and discussed.   Any x-rays performed were independently reviewed by myself.   Differential diagnosis were considered with the presenting HPI.  Medications  epoetin alfa (EPOGEN,PROCRIT) injection 3,000 Units (not administered)    Vitals:   06/29/17 0715 06/29/17 0739 06/29/17 0800 06/29/17 0830  BP: (!) 143/62 (!) 166/67 (!) 165/70   Pulse: 99 88 88 87  Resp: (!) 28 (!) 23 (!) 22 (!) 28  Temp: 97.7 F (36.5 C)     TempSrc: Oral     SpO2: 94% 93% 92% 95%  Weight:      Height:        Final diagnoses:  Acute dyspnea  End-stage renal disease needing dialysis (HCC)  Abnormal EKG  Hypoxia    Admission/ observation were discussed with the admitting physician, patient and/or family and they are comfortable with the plan.    Final Clinical Impressions(s) / ED Diagnoses   Final diagnoses:  Acute dyspnea  End-stage renal disease needing dialysis [facility])  Abnormal EKG  Hypoxia    ED Discharge Orders    None       Elnora Morrison, MD 06/29/17 1023    Elnora Morrison, MD 06/29/17 1116

## 2017-06-29 NOTE — H&P (Addendum)
History and Physical    Stephano Arrants SWF:[id number] DOB: [birth year] DOA: 06/29/2017  PCP: Rosita Fire, MD  Patient coming from: home  I have personally briefly reviewed patient's old medical records in [place]  Chief Complaint: shortness of breath  HPI: Wayne [name] is a 67 y.o. male with medical history significant of hypertension, diabetes, anemia of chronic disease, end-stage renal disease on hemodialysis Monday Wednesday and Friday, missed his last dialysis session on Wednesday since he had to attend his ophthalmology appointment at [facility].  Patient reports that he was in his usual state of health this morning and was planning to go to dialysis.  When he was walking to his car he noticed that he had dyspnea on exertion.  He noticed a tightness across his chest.  This became progressively worse leading to dyspnea with minimal exertion, associated with dizziness, lightheadedness and diaphoresis.  He is not had any cough, vomiting, diarrhea, fever.  ED Course: He was sent to the emergency room from the dialysis center for worsening dyspnea on exertion.  Chest x-ray in the emergency room indicated evidence of volume overload.  He was noted to be short of breath and hypoxic requiring supplemental oxygen.  Troponin was mildly elevated at 0.04.  EKG showed some ST depressions in the lateral leads.  Hemoglobin was slightly lower than baseline at 9.6.  Patient is been referred for admission.  Review of Systems: As per HPI otherwise 10 point review of systems negative.    Past Medical History:  Diagnosis Date  . Anemia of chronic disease   . ESRD (end stage renal disease) ([place])    Hemodialysis since December 2015  . Essential hypertension   . GERD (gastroesophageal reflux disease)    PMH:   . Hypercholesteremia   . Peripheral vascular disease ([place])   . Renal insufficiency   . Sarcoidosis   . Type 2 diabetes mellitus ([place])     Past Surgical History:  Procedure  Laterality Date  . AMPUTATION OF REPLICATED TOES Right 2202   3rd, 4th, 5th digits (toes) of right foot  . AV FISTULA PLACEMENT Left 12/31/2012   Procedure: ARTERIOVENOUS (AV) FISTULA CREATION-LEFT;  Surgeon: Angelia Mould, MD;  Location: [place];  Service: Vascular;  Laterality: Left;  . AV FISTULA PLACEMENT Right 11/03/2014   Procedure: INSERTION OF RIGHT ARM  ARTERIOVENOUS (AV) GORE-TEX GRAFT;  Surgeon: Angelia Mould, MD;  Location: [place];  Service: Vascular;  Laterality: Right;  . BELOW KNEE LEG AMPUTATION Left   . COLONOSCOPY    . COLONOSCOPY N/A 02/22/2017   Procedure: COLONOSCOPY;  Surgeon: Rogene Houston, MD;  [facility];  Service: Endoscopy;  Laterality: N/A;  830  . ESOPHAGEAL DILATION     8-10 yrs ago   . EYE SURGERY Right   . FISTULOGRAM N/A 07/27/2014   Procedure: FISTULOGRAM;  Surgeon: Angelia Mould, MD;  Location: [facility] CATH LAB;  Service: Cardiovascular;  Laterality: N/A;     reports that he quit smoking about 19 years ago. His smoking use included cigarettes. He has quit using smokeless tobacco. He reports that he does not drink alcohol or use drugs.  No Known Allergies  Family History  Problem Relation Age of Onset  . Diabetes Mellitus II Mother   . Diabetes Mother   . Colon cancer Mother      Prior to Admission medications   Medication Sig Start Date End Date Taking? Authorizing Provider  aspirin EC 81 MG tablet Take  1 tablet (81 mg total) at bedtime by mouth. 02/24/17  Yes Rehman, Mechele Dawley, MD  atorvastatin (LIPITOR) 80 MG tablet Take 40 mg daily by mouth.   Yes [provider]  cloNIDine (CATAPRES - DOSED IN MG/24 HR) 0.2 mg/24hr patch Place 0.2 mg onto the skin once a week.  02/28/17  Yes [provider]  insulin glargine (LANTUS) 100 UNIT/ML injection Inject 20 Units into the skin every other day. In the morning   Yes [provider]  sevelamer carbonate (RENVELA) 800 MG tablet Take 800-1,600 mg by  mouth See admin instructions. Take 2 tablets (1600 mg) by mouth with meals and 1 tablet (800 mg) with snacks   Yes [provider]  sitaGLIPtin (JANUVIA) 100 MG tablet Take 100 mg by mouth at bedtime.    Yes [provider]  timolol (TIMOPTIC) 0.5 % ophthalmic solution Place 1 drop into both eyes 2 (two) times daily. 02/13/17  Yes [provider]    Physical Exam: Vitals:   06/29/17 1452 06/29/17 1500 06/29/17 1530 06/29/17 1557  BP: 116/70 137/68  (!) 159/75  Pulse: 95 78 81 89  Resp: (!) 23 18  16   Temp:    97.9 F (36.6 C)  TempSrc:    Oral  SpO2: 92% 92% 98% 95%  Weight:    78 kg (172 lb)  Height:    6\' 1"  (1.854 m)    Constitutional: NAD, calm, comfortable Vitals:   06/29/17 1452 06/29/17 1500 06/29/17 1530 06/29/17 1557  BP: 116/70 137/68  (!) 159/75  Pulse: 95 78 81 89  Resp: (!) 23 18  16   Temp:    97.9 F (36.6 C)  TempSrc:    Oral  SpO2: 92% 92% 98% 95%  Weight:    78 kg (172 lb)  Height:    6\' 1"  (1.854 m)   Eyes: PERRL, lids and conjunctivae normal ENMT: Mucous membranes are moist. Posterior pharynx clear of any exudate or lesions.Normal dentition.  Neck: normal, supple, no masses, no thyromegaly Respiratory: some crackles at bases, no wheezes. Normal respiratory effort. No accessory muscle use.  Cardiovascular: Regular rate and rhythm, no murmurs / rubs / gallops. No extremity edema. 2+ pedal pulses. No carotid bruits.  Abdomen: no tenderness, no masses palpated. No hepatosplenomegaly. Bowel sounds positive.  Musculoskeletal: no clubbing / cyanosis. LLE BKA. Good ROM, no contractures. Normal muscle tone.  Skin: no rashes, lesions, ulcers. No induration Neurologic: no gross motor deficits Psychiatric: Normal judgment and insight. Alert and oriented x 3. Normal mood.   Labs on Admission: I have personally reviewed following labs and imaging studies  CBC: Recent Labs  Lab 06/29/17 0804  WBC 7.6  NEUTROABS 6.6  HGB 9.6*  HCT  29.7*  MCV 92.8  PLT 008   Basic Metabolic Panel: Recent Labs  Lab 06/29/17 0804  NA 143  K 4.4  CL 103  CO2 21*  GLUCOSE 185*  BUN 62*  CREATININE 14.91*  CALCIUM 9.3   GFR: Estimated Creatinine Clearance: 5.4 mL/min (A) (by C-G formula based on SCr of 14.91 mg/dL (H)). Liver Function Tests: No results for input(s): AST, ALT, ALKPHOS, BILITOT, PROT, ALBUMIN in the last 168 hours. No results for input(s): LIPASE, AMYLASE in the last 168 hours. No results for input(s): AMMONIA in the last 168 hours. Coagulation Profile: No results for input(s): INR, PROTIME in the last 168 hours. Cardiac Enzymes: Recent Labs  Lab 06/29/17 0804 06/29/17 1012  TROPONINI 0.04* 0.10*   BNP (  last 3 results) No results for input(s): PROBNP in the last 8760 hours. HbA1C: No results for input(s): HGBA1C in the last 72 hours. CBG: No results for input(s): GLUCAP in the last 168 hours. Lipid Profile: No results for input(s): CHOL, HDL, LDLCALC, TRIG, CHOLHDL, LDLDIRECT in the last 72 hours. Thyroid Function Tests: No results for input(s): TSH, T4TOTAL, FREET4, T3FREE, THYROIDAB in the last 72 hours. Anemia Panel: No results for input(s): VITAMINB12, FOLATE, FERRITIN, TIBC, IRON, RETICCTPCT in the last 72 hours. Urine analysis: No results found for: COLORURINE, APPEARANCEUR, LABSPEC, PHURINE, GLUCOSEU, HGBUR, BILIRUBINUR, KETONESUR, PROTEINUR, UROBILINOGEN, NITRITE, LEUKOCYTESUR  Radiological Exams on Admission: Dg Chest 2 View  Result Date: 06/29/2017 CLINICAL DATA:  Shortness of breath and left-sided chest pain. EXAM: CHEST - 2 VIEW COMPARISON:  Chest x-ray dated May 14, 2012. FINDINGS: The heart size and mediastinal contours are within normal limits. Calcified mediastinal and supraclavicular lymph nodes are again noted. Vascular congestion with diffusely increased interstitial markings, greater on the right. Hazy opacification in the right lower lobe. Small right pleural effusion. No  acute osseous abnormality. IMPRESSION: 1. Vascular congestion with asymmetric right greater than left interstitial edema. Hazy opacification in the right lower lobe could reflect asymmetric alveolar edema versus infiltrate. 2. Small right pleural effusion. Electronically Signed   By: Titus Dubin M.D.   On: 06/29/2017 09:07    EKG: Independently reviewed.  ST depressions laterally  Assessment/Plan Active Problems:   Anemia   End stage renal disease (HCC)   Fluid overload   Acute respiratory failure with hypoxia (HCC)   Noncompliance of patient with renal dialysis ([place])   Type 2 diabetes mellitus with stage 5 chronic kidney disease ([place])    1. Acute respiratory failure with hypoxia.  Related to pulmonary edema from noncompliance with dialysis.  Nephrology been consulted and he is been started on hemodialysis.  Wean off oxygen as tolerated. 2. End-stage renal disease on hemodialysis.  Nephrology following for dialysis needs.  He is undergoing dialysis now. 3. Type 2 diabetes.  Continue on Lantus.  Supplement with sliding scale insulin. 4. Anemia of chronic renal disease.  Slightly lower than baseline, likely hemodilution.  Should improve after dialysis.  No evidence of bleeding.  Continue to monitor. 5. EKG changes.  Patient found to have ST depressions in lateral leads.  Likely demand ischemia in the setting of respiratory failure and pulmonary edema.  Repeat EKG after dialysis.  DVT prophylaxis: Heparin Code Status: Full code Family Communication: no family present Disposition Plan: discharge home in AM if respiratory status is stable Consults called: nephrology Admission status: observation, telemetry   Kathie Dike MD [facility] Pager [phone]  If 7PM-7AM, please contact night-coverage [email or web address] Password [email or web address]  06/29/2017, 4:37 PM

## 2017-06-30 DIAGNOSIS — R9431 Abnormal electrocardiogram [ECG] [EKG]: Secondary | ICD-10-CM | POA: Diagnosis not present

## 2017-06-30 DIAGNOSIS — N186 End stage renal disease: Secondary | ICD-10-CM | POA: Diagnosis not present

## 2017-06-30 DIAGNOSIS — J9601 Acute respiratory failure with hypoxia: Secondary | ICD-10-CM | POA: Diagnosis not present

## 2017-06-30 LAB — RENAL FUNCTION PANEL
Albumin: 3.6 g/dL (ref 3.5–5.0)
Anion gap: 16 — ABNORMAL HIGH (ref 5–15)
BUN: 52 mg/dL — AB (ref 6–20)
CALCIUM: 9.4 mg/dL (ref 8.9–10.3)
CO2: 24 mmol/L (ref 22–32)
CREATININE: 13.76 mg/dL — AB (ref 0.61–1.24)
Chloride: 104 mmol/L (ref 101–111)
GFR calc Af Amer: 4 mL/min — ABNORMAL LOW (ref >60)
GFR calc non Af Amer: 3 mL/min — ABNORMAL LOW (ref >60)
GLUCOSE: 114 mg/dL — AB (ref 65–99)
Phosphorus: 2.6 mg/dL (ref 2.5–4.6)
Potassium: 4.2 mmol/L (ref 3.5–5.1)
SODIUM: 144 mmol/L (ref 135–145)

## 2017-06-30 LAB — GLUCOSE, CAPILLARY
GLUCOSE-CAPILLARY: 113 mg/dL — AB (ref 65–99)
Glucose-Capillary: 212 mg/dL — ABNORMAL HIGH (ref 65–99)

## 2017-06-30 LAB — CBC
HCT: 28 % — ABNORMAL LOW (ref 39.0–52.0)
Hemoglobin: 9.2 g/dL — ABNORMAL LOW (ref 13.0–17.0)
MCH: 30.7 pg (ref 26.0–34.0)
MCHC: 32.9 g/dL (ref 30.0–36.0)
MCV: 93.3 fL (ref 78.0–100.0)
PLATELETS: 160 10*3/uL (ref 150–400)
RBC: 3 MIL/uL — ABNORMAL LOW (ref 4.22–5.81)
RDW: 14.5 % (ref 11.5–15.5)
WBC: 6.9 10*3/uL (ref 4.0–10.5)

## 2017-06-30 LAB — HEPATITIS B SURFACE ANTIGEN: HEP B S AG: NEGATIVE

## 2017-06-30 MED ORDER — SEVELAMER CARBONATE 800 MG PO TABS
800.0000 mg | ORAL_TABLET | Freq: Three times a day (TID) | ORAL | Status: DC
  Filled 2017-06-30: qty 1

## 2017-06-30 NOTE — Progress Notes (Signed)
Patient is to be discharged home and in stable condition. IV and telemetry removed, WNL. Patient given discharge instructions and verbalized understanding. Patient will be escorted out by staff via wheelchair.  Coral Soler P Dishmon, RN  

## 2017-06-30 NOTE — Discharge Summary (Signed)
Physician Discharge Summary  Wayne [name] NID:[id number] DOB: November 27, 1950 DOA: 06/29/2017  PCP: Rosita Fire, MD  Admit date: 06/29/2017 Discharge date: 06/30/2017  Admitted From: Home Disposition: Home  Recommendations for Outpatient Follow-up:  1. Follow up with PCP in 1-2 weeks 2. Please obtain BMP/CBC in one week 3. Arrangements have been made by nephrology to have patient's AV graft declotted on 3/18.  He will undergo his next dialysis session on 3/19  Home Health: Equipment/Devices:  Discharge Condition: Stable CODE STATUS: Full code Diet recommendation: Heart Healthy   Brief/Interim Summary: 67 year old male with a history of hypertension, diabetes, end-stage renal disease on hemodialysis, had missed his last dialysis session on Wednesday.  He was in his usual state of health on the morning of admission and was planning to go to dialysis when he became increasingly short of breath and a tightness across his chest.  He was directed to come to the emergency room where he was noted to be in volume overload.  The patient was admitted to the hospital and underwent dialysis with improvement of his symptoms.  He initially had some ST depressions on EKG prior to dialysis.  After volume removal with dialysis, follow-up EKG showed resolution of changes.  Patient respiratory status significantly improved after dialysis and he no longer had any chest discomfort.  He was advised to be compliant with dialysis and not to miss any sessions.  He was also advised to avoid any salty foods.  It was noted that during his hospital stay, his AV graft clotted off.  Nephrology has made arrangements for the patient's graft to be declotted on 3/18 in [place].  His next dialysis session will be scheduled for 3/19  Discharge Diagnoses:  Active Problems:   Anemia   End stage renal disease (HCC)   Fluid overload   Acute respiratory failure with hypoxia (HCC)   Noncompliance of patient with renal dialysis  ([place])   Type 2 diabetes mellitus with stage 5 chronic kidney disease [facility])    Discharge Instructions  Discharge Instructions    Diet - low sodium heart healthy   Complete by:  As directed    Increase activity slowly   Complete by:  As directed      Allergies as of 06/30/2017   No Known Allergies     Medication List    TAKE these medications   aspirin EC 81 MG tablet Take 1 tablet (81 mg total) at bedtime by mouth.   atorvastatin 80 MG tablet Commonly known as:  LIPITOR Take 40 mg daily by mouth.   cloNIDine 0.2 mg/24hr patch Commonly known as:  CATAPRES - Dosed in mg/24 hr Place 0.2 mg onto the skin once a week.   insulin glargine 100 UNIT/ML injection Commonly known as:  LANTUS Inject 20 Units into the skin every other day. In the morning   sevelamer carbonate 800 MG tablet Commonly known as:  RENVELA Take 800-1,600 mg by mouth See admin instructions. Take 2 tablets (1600 mg) by mouth with meals and 1 tablet (800 mg) with snacks   sitaGLIPtin 100 MG tablet Commonly known as:  JANUVIA Take 100 mg by mouth at bedtime.   timolol 0.5 % ophthalmic solution Commonly known as:  TIMOPTIC Place 1 drop into both eyes 2 (two) times daily.       No Known Allergies  Consultations:  Nephrology   Procedures/Studies: Dg Chest 2 View  Result Date: 06/29/2017 CLINICAL DATA:  Shortness of breath and left-sided chest pain. EXAM: CHEST -  2 VIEW COMPARISON:  Chest x-ray dated May 14, 2012. FINDINGS: The heart size and mediastinal contours are within normal limits. Calcified mediastinal and supraclavicular lymph nodes are again noted. Vascular congestion with diffusely increased interstitial markings, greater on the right. Hazy opacification in the right lower lobe. Small right pleural effusion. No acute osseous abnormality. IMPRESSION: 1. Vascular congestion with asymmetric right greater than left interstitial edema. Hazy opacification in the right lower lobe could reflect  asymmetric alveolar edema versus infiltrate. 2. Small right pleural effusion. Electronically Signed   By: Titus Dubin M.D.   On: 06/29/2017 09:07       Subjective: No shortness of breath, no chest pain  Discharge Exam: Vitals:   06/30/17 0500 06/30/17 1300  BP: (!) 168/67 (!) 166/81  Pulse: 96 91  Resp: 18 16  Temp: 99.3 F (37.4 C) 98.8 F (37.1 C)  SpO2: 95% 98%   Vitals:   06/29/17 1557 06/29/17 2117 06/30/17 0500 06/30/17 1300  BP: (!) 159/75 (!) 163/63 (!) 168/67 (!) 166/81  Pulse: 89 88 96 91  Resp: 16 20 18 16   Temp: 97.9 F (36.6 C) 99.2 F (37.3 C) 99.3 F (37.4 C) 98.8 F (37.1 C)  TempSrc: Oral Oral Oral Oral  SpO2: 95% 91% 95% 98%  Weight: 78 kg (172 lb)  71.7 kg (158 lb 1.1 oz)   Height: 6\' 1"  (1.854 m)       General: Pt is alert, awake, not in acute distress Cardiovascular: RRR, S1/S2 +, no rubs, no gallops Respiratory: CTA bilaterally, no wheezing, no rhonchi Abdominal: Soft, NT, ND, bowel sounds + Extremities: Left lower extremity BKA    The results of significant diagnostics from this hospitalization (including imaging, microbiology, ancillary and laboratory) are listed below for reference.     Microbiology: Recent Results (from the past 240 hour(s))  MRSA PCR Screening     Status: None   Collection Time: 06/29/17  4:07 PM  Result Value Ref Range Status   MRSA by PCR NEGATIVE NEGATIVE Final    Comment:        The GeneXpert MRSA Assay (FDA approved for NASAL specimens only), is one component of a comprehensive MRSA colonization surveillance program. It is not intended to diagnose MRSA infection nor to guide or monitor treatment for MRSA infections. Performed at [facility], [address] [place], [place] [address]      Labs: BNP (last 3 results) Recent Labs    06/29/17 0804  BNP 761.6*   Basic Metabolic Panel: Recent Labs  Lab 06/29/17 0804 06/30/17 0704  NA 143 144  K 4.4 4.2  CL 103 104  CO2 21* 24  GLUCOSE  185* 114*  BUN 62* 52*  CREATININE 14.91* 13.76*  CALCIUM 9.3 9.4  PHOS  --  2.6   Liver Function Tests: Recent Labs  Lab 06/30/17 0704  ALBUMIN 3.6   No results for input(s): LIPASE, AMYLASE in the last 168 hours. No results for input(s): AMMONIA in the last 168 hours. CBC: Recent Labs  Lab 06/29/17 0804 06/30/17 0704  WBC 7.6 6.9  NEUTROABS 6.6  --   HGB 9.6* 9.2*  HCT 29.7* 28.0*  MCV 92.8 93.3  PLT 170 160   Cardiac Enzymes: Recent Labs  Lab 06/29/17 0804 06/29/17 1012  TROPONINI 0.04* 0.10*   BNP: Invalid input(s): POCBNP CBG: Recent Labs  Lab 06/29/17 1652 06/29/17 2053 06/30/17 0745 06/30/17 1108  GLUCAP 147* 222* 113* 212*   D-Dimer No results for input(s): DDIMER in  the last 72 hours. Hgb A1c No results for input(s): HGBA1C in the last 72 hours. Lipid Profile No results for input(s): CHOL, HDL, LDLCALC, TRIG, CHOLHDL, LDLDIRECT in the last 72 hours. Thyroid function studies No results for input(s): TSH, T4TOTAL, T3FREE, THYROIDAB in the last 72 hours.  Invalid input(s): FREET3 Anemia work up No results for input(s): VITAMINB12, FOLATE, FERRITIN, TIBC, IRON, RETICCTPCT in the last 72 hours. Urinalysis No results found for: COLORURINE, APPEARANCEUR, [place], [place], [place], [place], [place], [place], PROTEINUR, UROBILINOGEN, NITRITE, LEUKOCYTESUR Sepsis Labs Invalid input(s): PROCALCITONIN,  WBC,  LACTICIDVEN Microbiology Recent Results (from the past 240 hour(s))  MRSA PCR Screening     Status: None   Collection Time: 06/29/17  4:07 PM  Result Value Ref Range Status   MRSA by PCR NEGATIVE NEGATIVE Final    Comment:        The GeneXpert MRSA Assay (FDA approved for NASAL specimens only), is one component of a comprehensive MRSA colonization surveillance program. It is not intended to diagnose MRSA infection nor to guide or monitor treatment for MRSA infections. Performed at [facility], [address] [place], [place]  [address]      Time coordinating discharge: Over 30 minutes  SIGNED:   Kathie Dike, MD  [facility] 06/30/2017, 8:07 PM Pager   If 7PM-7AM, please contact night-coverage [email or web address] Password [email or web address]

## 2017-06-30 NOTE — Progress Notes (Signed)
Subjective: Interval History: has no complaint of difficulty breathing.  Patient overall feels much better.  He does not have any nausea or vomiting..  Objective: Vital signs in last 24 hours: Temp:  [97.6 F (36.4 C)-99.3 F (37.4 C)] 99.3 F (37.4 C) (03/16 0500) Pulse Rate:  [78-111] 96 (03/16 0500) Resp:  [14-30] 18 (03/16 0500) BP: (116-168)/(45-81) 168/67 (03/16 0500) SpO2:  [85 %-98 %] 95 % (03/16 0500) Weight:  [71.7 kg (158 lb 1.1 oz)-78 kg (172 lb)] 71.7 kg (158 lb 1.1 oz) (03/16 0500) Weight change: 0 kg (0 lb)  Intake/Output from previous day: 03/15 0701 - 03/16 0700 In: 120 [P.O.:120] Out: 2443  Intake/Output this shift: No intake/output data recorded.  General appearance: alert, cooperative and no distress Resp: clear to auscultation bilaterally Cardio: regular rate and rhythm Extremities: no edema  Lab Results: Recent Labs    06/29/17 0804 06/30/17 0704  WBC 7.6 6.9  HGB 9.6* 9.2*  HCT 29.7* 28.0*  PLT 170 160   BMET:  Recent Labs    06/29/17 0804 06/30/17 0704  NA 143 144  K 4.4 4.2  CL 103 104  CO2 21* 24  GLUCOSE 185* 114*  BUN 62* 52*  CREATININE 14.91* 13.76*  CALCIUM 9.3 9.4   No results for input(s): PTH in the last 72 hours. Iron Studies: No results for input(s): IRON, TIBC, TRANSFERRIN, FERRITIN in the last 72 hours.  Studies/Results: Dg Chest 2 View  Result Date: 06/29/2017 CLINICAL DATA:  Shortness of breath and left-sided chest pain. EXAM: CHEST - 2 VIEW COMPARISON:  Chest x-ray dated May 14, 2012. FINDINGS: The heart size and mediastinal contours are within normal limits. Calcified mediastinal and supraclavicular lymph nodes are again noted. Vascular congestion with diffusely increased interstitial markings, greater on the right. Hazy opacification in the right lower lobe. Small right pleural effusion. No acute osseous abnormality. IMPRESSION: 1. Vascular congestion with asymmetric right greater than left interstitial edema.  Hazy opacification in the right lower lobe could reflect asymmetric alveolar edema versus infiltrate. 2. Small right pleural effusion. Electronically Signed   By: Titus Dubin M.D.   On: 06/29/2017 09:07    I have reviewed the patient's current medications.  Assessment/Plan: 1] difficulty breathing: This is due to fluid overload.  Patient was dialyzed yesterday and 2400 cc of fluid removed.  Presently patient is feeling much better. 2] end-stage renal disease: Potassium is normal and he does not have any uremic signs and symptoms. 3] bone and mineral disorder: His calcium  is range but his phosphorus is slightly low..  Patient is on Renvela 800 mg 2 tablet p.o. 3 times daily with meals and one with a snack. 4] anemia: His hemoglobin is 9.2 below target goal.  Patient is on Epogen as an outpatient. 5] hypertension: His blood pressure is reasonably controlled 6] history of diabetes: His blood sugar is reasonable 7] clotted AV graft. Plan: 1]Patient does require dialysis today.  His next dialysis will be on Monday. 2] we will try to make arrangement for his graft to be declotted.  3] we will decrease Renvela to 800 mg 1 tablet p.o. 3 times daily with meals.   LOS: 0 days   Wayne [name] S 06/30/2017,10:07 AM

## 2017-07-01 LAB — HEPATITIS B SURFACE ANTIGEN: Hepatitis B Surface Ag: NEGATIVE

## 2017-07-09 ENCOUNTER — Other Ambulatory Visit (HOSPITAL_COMMUNITY): Payer: Self-pay | Admitting: Nephrology

## 2017-07-09 ENCOUNTER — Other Ambulatory Visit: Payer: Self-pay | Admitting: Radiology

## 2017-07-09 DIAGNOSIS — N186 End stage renal disease: Secondary | ICD-10-CM

## 2017-07-10 ENCOUNTER — Other Ambulatory Visit: Payer: Self-pay

## 2017-07-10 ENCOUNTER — Other Ambulatory Visit: Payer: Self-pay | Admitting: Radiology

## 2017-07-10 ENCOUNTER — Encounter (HOSPITAL_COMMUNITY): Payer: Self-pay

## 2017-07-10 ENCOUNTER — Observation Stay (HOSPITAL_COMMUNITY): Payer: Medicare Other

## 2017-07-10 ENCOUNTER — Observation Stay (HOSPITAL_COMMUNITY)
Admission: RE | Admit: 2017-07-10 | Discharge: 2017-07-11 | Disposition: A | Discharge status: Home / Self Care | Payer: Medicare Other | Source: Ambulatory Visit | Attending: Internal Medicine | Admitting: Internal Medicine

## 2017-07-10 ENCOUNTER — Other Ambulatory Visit (HOSPITAL_COMMUNITY): Payer: Self-pay | Admitting: Nephrology

## 2017-07-10 VITALS — BP 139/64 | HR 90 | Temp 98.2°F | Resp 15 | Ht 73.0 in | Wt 147.7 lb

## 2017-07-10 DIAGNOSIS — J9600 Acute respiratory failure, unspecified whether with hypoxia or hypercapnia: Secondary | ICD-10-CM | POA: Diagnosis present

## 2017-07-10 DIAGNOSIS — N186 End stage renal disease: Secondary | ICD-10-CM | POA: Diagnosis not present

## 2017-07-10 DIAGNOSIS — K219 Gastro-esophageal reflux disease without esophagitis: Secondary | ICD-10-CM | POA: Diagnosis not present

## 2017-07-10 DIAGNOSIS — J90 Pleural effusion, not elsewhere classified: Secondary | ICD-10-CM | POA: Insufficient documentation

## 2017-07-10 DIAGNOSIS — Z794 Long term (current) use of insulin: Secondary | ICD-10-CM | POA: Diagnosis not present

## 2017-07-10 DIAGNOSIS — E7800 Pure hypercholesterolemia, unspecified: Secondary | ICD-10-CM | POA: Insufficient documentation

## 2017-07-10 DIAGNOSIS — J9601 Acute respiratory failure with hypoxia: Principal | ICD-10-CM | POA: Insufficient documentation

## 2017-07-10 DIAGNOSIS — E1151 Type 2 diabetes mellitus with diabetic peripheral angiopathy without gangrene: Secondary | ICD-10-CM | POA: Diagnosis not present

## 2017-07-10 DIAGNOSIS — N185 Chronic kidney disease, stage 5: Secondary | ICD-10-CM

## 2017-07-10 DIAGNOSIS — Z992 Dependence on renal dialysis: Secondary | ICD-10-CM | POA: Diagnosis not present

## 2017-07-10 DIAGNOSIS — I739 Peripheral vascular disease, unspecified: Secondary | ICD-10-CM

## 2017-07-10 DIAGNOSIS — Z89421 Acquired absence of other right toe(s): Secondary | ICD-10-CM | POA: Diagnosis not present

## 2017-07-10 DIAGNOSIS — Y832 Surgical operation with anastomosis, bypass or graft as the cause of abnormal reaction of the patient, or of later complication, without mention of misadventure at the time of the procedure: Secondary | ICD-10-CM | POA: Diagnosis not present

## 2017-07-10 DIAGNOSIS — D869 Sarcoidosis, unspecified: Secondary | ICD-10-CM | POA: Diagnosis not present

## 2017-07-10 DIAGNOSIS — E8770 Fluid overload, unspecified: Secondary | ICD-10-CM

## 2017-07-10 DIAGNOSIS — E785 Hyperlipidemia, unspecified: Secondary | ICD-10-CM

## 2017-07-10 DIAGNOSIS — E1122 Type 2 diabetes mellitus with diabetic chronic kidney disease: Secondary | ICD-10-CM | POA: Diagnosis not present

## 2017-07-10 DIAGNOSIS — T82868S Thrombosis of vascular prosthetic devices, implants and grafts, sequela: Secondary | ICD-10-CM | POA: Insufficient documentation

## 2017-07-10 DIAGNOSIS — I120 Hypertensive chronic kidney disease with stage 5 chronic kidney disease or end stage renal disease: Secondary | ICD-10-CM | POA: Diagnosis not present

## 2017-07-10 DIAGNOSIS — N19 Unspecified kidney failure: Secondary | ICD-10-CM

## 2017-07-10 DIAGNOSIS — Z7982 Long term (current) use of aspirin: Secondary | ICD-10-CM | POA: Diagnosis not present

## 2017-07-10 DIAGNOSIS — D631 Anemia in chronic kidney disease: Secondary | ICD-10-CM | POA: Insufficient documentation

## 2017-07-10 DIAGNOSIS — Z89512 Acquired absence of left leg below knee: Secondary | ICD-10-CM | POA: Diagnosis not present

## 2017-07-10 DIAGNOSIS — Z87891 Personal history of nicotine dependence: Secondary | ICD-10-CM | POA: Diagnosis not present

## 2017-07-10 DIAGNOSIS — D649 Anemia, unspecified: Secondary | ICD-10-CM | POA: Diagnosis present

## 2017-07-10 HISTORY — PX: IR FLUORO GUIDE CV LINE RIGHT: IMG2283

## 2017-07-10 HISTORY — PX: IR US GUIDE VASC ACCESS RIGHT: IMG2390

## 2017-07-10 LAB — RENAL FUNCTION PANEL
ANION GAP: 15 (ref 5–15)
Albumin: 3.4 g/dL — ABNORMAL LOW (ref 3.5–5.0)
BUN: 21 mg/dL — ABNORMAL HIGH (ref 6–20)
CHLORIDE: 100 mmol/L — AB (ref 101–111)
CO2: 23 mmol/L (ref 22–32)
Calcium: 9 mg/dL (ref 8.9–10.3)
Creatinine, Ser: 7.91 mg/dL — ABNORMAL HIGH (ref 0.61–1.24)
GFR calc non Af Amer: 6 mL/min — ABNORMAL LOW (ref >60)
GFR, EST AFRICAN AMERICAN: 7 mL/min — AB (ref >60)
GLUCOSE: 120 mg/dL — AB (ref 65–99)
POTASSIUM: 4 mmol/L (ref 3.5–5.1)
Phosphorus: 3.2 mg/dL (ref 2.5–4.6)
SODIUM: 138 mmol/L (ref 135–145)

## 2017-07-10 LAB — CBC
HCT: 27.3 % — ABNORMAL LOW (ref 39.0–52.0)
Hemoglobin: 9.1 g/dL — ABNORMAL LOW (ref 13.0–17.0)
MCH: 30.7 pg (ref 26.0–34.0)
MCHC: 33.3 g/dL (ref 30.0–36.0)
MCV: 92.2 fL (ref 78.0–100.0)
Platelets: 261 10*3/uL (ref 150–400)
RBC: 2.96 MIL/uL — ABNORMAL LOW (ref 4.22–5.81)
RDW: 14.4 % (ref 11.5–15.5)
WBC: 5.7 10*3/uL (ref 4.0–10.5)

## 2017-07-10 LAB — POCT I-STAT 4, (NA,K, GLUC, HGB,HCT)
GLUCOSE: 207 mg/dL — AB (ref 65–99)
HCT: 33 % — ABNORMAL LOW (ref 39.0–52.0)
Hemoglobin: 11.2 g/dL — ABNORMAL LOW (ref 13.0–17.0)
POTASSIUM: 4 mmol/L (ref 3.5–5.1)
SODIUM: 144 mmol/L (ref 135–145)

## 2017-07-10 LAB — CREATININE, SERUM
CREATININE: 14.46 mg/dL — AB (ref 0.61–1.24)
GFR calc non Af Amer: 3 mL/min — ABNORMAL LOW (ref >60)
GFR, EST AFRICAN AMERICAN: 4 mL/min — AB (ref >60)

## 2017-07-10 LAB — GLUCOSE, CAPILLARY
Glucose-Capillary: 137 mg/dL — ABNORMAL HIGH (ref 65–99)
Glucose-Capillary: 117 mg/dL — ABNORMAL HIGH (ref 65–99)

## 2017-07-10 LAB — PROTIME-INR
INR: 0.96
Prothrombin Time: 12.7 seconds (ref 11.4–15.2)

## 2017-07-10 LAB — HEMOGLOBIN A1C
Hgb A1c MFr Bld: 5.1 % (ref 4.8–5.6)
Mean Plasma Glucose: 99.67 mg/dL

## 2017-07-10 LAB — TROPONIN I: TROPONIN I: 0.61 ng/mL — AB (ref <0.03)

## 2017-07-10 LAB — BRAIN NATRIURETIC PEPTIDE: B Natriuretic Peptide: 1444.6 pg/mL — ABNORMAL HIGH (ref 0.0–100.0)

## 2017-07-10 MED ORDER — FENTANYL CITRATE (PF) 100 MCG/2ML IJ SOLN
INTRAMUSCULAR | Status: AC
  Filled 2017-07-10: qty 4

## 2017-07-10 MED ORDER — INSULIN GLARGINE 100 UNIT/ML ~~LOC~~ SOLN: 20.0000 [IU] | SUBCUTANEOUS | Status: DC

## 2017-07-10 MED ORDER — LIDOCAINE-PRILOCAINE 2.5-2.5 % EX CREA: 1.0000 "application " | TOPICAL_CREAM | CUTANEOUS | Status: DC | PRN

## 2017-07-10 MED ORDER — PENTAFLUOROPROP-TETRAFLUOROETH EX AERO: 1.0000 "application " | INHALATION_SPRAY | CUTANEOUS | Status: DC | PRN

## 2017-07-10 MED ORDER — ASPIRIN EC 81 MG PO TBEC
81.0000 mg | DELAYED_RELEASE_TABLET | Freq: Every day | ORAL | Status: DC
  Administered 2017-07-10: 81 mg via ORAL
  Filled 2017-07-10: qty 1

## 2017-07-10 MED ORDER — SODIUM CHLORIDE 0.9 % IV SOLN: 100.0000 mL | INTRAVENOUS | Status: DC | PRN

## 2017-07-10 MED ORDER — CEFAZOLIN SODIUM-DEXTROSE 2-4 GM/100ML-% IV SOLN: 2.0000 g | INTRAVENOUS | Status: DC

## 2017-07-10 MED ORDER — SODIUM CHLORIDE 0.9 % IV SOLN: INTRAVENOUS | Status: DC

## 2017-07-10 MED ORDER — SEVELAMER CARBONATE 800 MG PO TABS
1600.0000 mg | ORAL_TABLET | Freq: Three times a day (TID) | ORAL | Status: DC
  Administered 2017-07-11: 1600 mg via ORAL
  Filled 2017-07-10: qty 2

## 2017-07-10 MED ORDER — TIMOLOL MALEATE 0.5 % OP SOLN
1.0000 [drp] | Freq: Two times a day (BID) | OPHTHALMIC | Status: DC
  Administered 2017-07-10: 1 [drp] via OPHTHALMIC
  Filled 2017-07-10: qty 5

## 2017-07-10 MED ORDER — BISACODYL 10 MG RE SUPP: 10.0000 mg | Freq: Every day | RECTAL | Status: DC | PRN

## 2017-07-10 MED ORDER — ACETAMINOPHEN 650 MG RE SUPP: 650.0000 mg | Freq: Four times a day (QID) | RECTAL | Status: DC | PRN

## 2017-07-10 MED ORDER — MIDAZOLAM HCL 2 MG/2ML IJ SOLN
INTRAMUSCULAR | Status: AC
  Filled 2017-07-10: qty 4

## 2017-07-10 MED ORDER — HEPARIN SODIUM (PORCINE) 1000 UNIT/ML IJ SOLN
INTRAMUSCULAR | Status: AC | PRN
  Administered 2017-07-10: 2800 [IU] via INTRAVENOUS

## 2017-07-10 MED ORDER — ATORVASTATIN CALCIUM 40 MG PO TABS
40.0000 mg | ORAL_TABLET | Freq: Every day | ORAL | Status: DC
  Administered 2017-07-10 – 2017-07-11 (×2): 40 mg via ORAL
  Filled 2017-07-10 (×2): qty 1

## 2017-07-10 MED ORDER — LIDOCAINE HCL 1 % IJ SOLN
INTRAMUSCULAR | Status: AC
  Filled 2017-07-10: qty 20

## 2017-07-10 MED ORDER — SENNOSIDES-DOCUSATE SODIUM 8.6-50 MG PO TABS: 1.0000 | ORAL_TABLET | Freq: Every evening | ORAL | Status: DC | PRN

## 2017-07-10 MED ORDER — LIDOCAINE HCL (PF) 1 % IJ SOLN
INTRAMUSCULAR | Status: AC | PRN
  Administered 2017-07-10: 2 mL

## 2017-07-10 MED ORDER — HEPARIN SODIUM (PORCINE) 1000 UNIT/ML IJ SOLN
INTRAMUSCULAR | Status: AC
  Filled 2017-07-10: qty 1

## 2017-07-10 MED ORDER — INSULIN ASPART 100 UNIT/ML ~~LOC~~ SOLN
0.0000 [IU] | Freq: Three times a day (TID) | SUBCUTANEOUS | Status: DC
  Administered 2017-07-11: 1 [IU] via SUBCUTANEOUS

## 2017-07-10 MED ORDER — ACETAMINOPHEN 325 MG PO TABS: 650.0000 mg | ORAL_TABLET | Freq: Four times a day (QID) | ORAL | Status: DC | PRN

## 2017-07-10 MED ORDER — LIDOCAINE HCL (PF) 1 % IJ SOLN: 5.0000 mL | INTRAMUSCULAR | Status: DC | PRN

## 2017-07-10 MED ORDER — SEVELAMER CARBONATE 800 MG PO TABS: 800.0000 mg | ORAL_TABLET | ORAL | Status: DC

## 2017-07-10 MED ORDER — SEVELAMER CARBONATE 800 MG PO TABS: 800.0000 mg | ORAL_TABLET | ORAL | Status: DC | PRN

## 2017-07-10 MED ORDER — HEPARIN SODIUM (PORCINE) 1000 UNIT/ML DIALYSIS: 1000.0000 [IU] | INTRAMUSCULAR | Status: DC | PRN

## 2017-07-10 MED ORDER — ALTEPLASE 2 MG IJ SOLR: 2.0000 mg | Freq: Once | INTRAMUSCULAR | Status: DC | PRN

## 2017-07-10 MED ORDER — ONDANSETRON HCL 4 MG PO TABS: 4.0000 mg | ORAL_TABLET | Freq: Four times a day (QID) | ORAL | Status: DC | PRN

## 2017-07-10 MED ORDER — SODIUM CHLORIDE 0.9 % IV SOLN
INTRAVENOUS | Status: AC | PRN
  Administered 2017-07-10: 10 mL/h via INTRAVENOUS

## 2017-07-10 MED ORDER — ONDANSETRON HCL 4 MG/2ML IJ SOLN: 4.0000 mg | Freq: Four times a day (QID) | INTRAMUSCULAR | Status: DC | PRN

## 2017-07-10 MED ORDER — CEFAZOLIN SODIUM-DEXTROSE 2-4 GM/100ML-% IV SOLN
INTRAVENOUS | Status: AC
  Filled 2017-07-10: qty 100

## 2017-07-10 MED ORDER — HEPARIN SODIUM (PORCINE) 5000 UNIT/ML IJ SOLN
5000.0000 [IU] | Freq: Three times a day (TID) | INTRAMUSCULAR | Status: DC
  Administered 2017-07-10 – 2017-07-11 (×2): 5000 [IU] via SUBCUTANEOUS
  Filled 2017-07-10 (×2): qty 1

## 2017-07-10 MED ORDER — HYDROCODONE-ACETAMINOPHEN 5-325 MG PO TABS: 1.0000 | ORAL_TABLET | ORAL | Status: DC | PRN

## 2017-07-10 NOTE — H&P (Signed)
History and Physical    Wayne [name] PIR:[id number] DOB: [birth year] DOA: 07/10/2017   PCP: Rosita Fire, MD   Patient coming from:  Home    Chief Complaint: Severe shortness of breath  HPI: Wayne [name] is a 67 y.o. male with medical history significant for hypertension, diabetes, glaucoma with visual impairment, anemia of chronic disease, ESRD on hemodialysis Monday Wednesday and Friday, last dialysis session on July 06, 2017, under the care of Dr. Lowanda Foster, presenting today for tunnel catheter insertion, after he is right arm dialysis graft was clotted.  Of note, this graft had been declotted on 07/02/2017, but was unable to be used.  Today, he was significantly hypoxic in supine position ,tachypneic, agitated, and hypertensive.  Initially on NRB he is now on 5 L of oxygen, but still symptomatic due to fluid overload.  A temporary catheter was placed for dialysis today, and needs to undergo dialysis later this afternoon anticipating improvement in his symptoms. He denies any dizziness, lightheadedness, or diaphoresis.  He denies any chest pain or palpitations.  He denies any abdominal pain, nausea or vomiting.  The patient made a small amount of urine, about half cup, he denies any blood in the urine.  She denies any significant lower extremity swelling.  No confusion is reported.  No symptoms of alcohol, alcohol or recreational drug use   IR Course:  BP (!) 190/92   Pulse (!) 104   Temp 98.2 F (36.8 C) (Oral)   Resp (!) 25   Ht 6\' 1"  (1.854 m)   Wt 78 kg (172 lb)   SpO2 96%   BMI 22.69 kg/m   Creatinine on admission is 14.46 Glucose 114 BMP and troponin, EKG pending White count 5.7, hemoglobin 11.2, platelets 261 PT 12.7, INR 0.96 Last 2D echo was in 2016, EF 63-01%, grade 1 diastolic, normal systolic Chest x-rays pending  Review of Systems:  As per HPI otherwise all other systems reviewed and are negative  Past Medical History:  Diagnosis Date  . Anemia of chronic disease    . ESRD (end stage renal disease) ([place])    Hemodialysis since December 2015  . Essential hypertension   . GERD (gastroesophageal reflux disease)    PMH:   . Hypercholesteremia   . Peripheral vascular disease ([place])   . Renal insufficiency   . Sarcoidosis   . Type 2 diabetes mellitus ([place])     Past Surgical History:  Procedure Laterality Date  . AMPUTATION OF REPLICATED TOES Right 6010   3rd, 4th, 5th digits (toes) of right foot  . AV FISTULA PLACEMENT Left 12/31/2012   Procedure: ARTERIOVENOUS (AV) FISTULA CREATION-LEFT;  Surgeon: Angelia Mould, MD;  Location: [facility];  Service: Vascular;  Laterality: Left;  . AV FISTULA PLACEMENT Right 11/03/2014   Procedure: INSERTION OF RIGHT ARM  ARTERIOVENOUS (AV) GORE-TEX GRAFT;  Surgeon: Angelia Mould, MD;  Location: [facility];  Service: Vascular;  Laterality: Right;  . BELOW KNEE LEG AMPUTATION Left   . COLONOSCOPY    . COLONOSCOPY N/A 02/22/2017   Procedure: COLONOSCOPY;  Surgeon: Rogene Houston, MD;  [facility];  Service: Endoscopy;  Laterality: N/A;  830  . ESOPHAGEAL DILATION     8-10 yrs ago   . EYE SURGERY Right   . FISTULOGRAM N/A 07/27/2014   Procedure: FISTULOGRAM;  Surgeon: Angelia Mould, MD;  Location: [facility] C[facility];  Service: Cardiovascular;  Laterality: N/A;    Social History Social History   Socioeconomic History  .  Marital status: Married    Spouse name: Not on file  . Number of children: Not on file  . Years of education: Not on file  . Highest education level: Not on file  Occupational History  . Not on file  Social Needs  . Financial resource strain: Not on file  . Food insecurity:    Worry: Not on file    Inability: Not on file  . Transportation needs:    Medical: Not on file    Non-medical: Not on file  Tobacco Use  . Smoking status: Former Smoker    Types: Cigarettes    Last attempt to quit: 04/17/1998    Years since quitting: 19.2  . Smokeless tobacco: Former Dance movement psychotherapist and Sexual Activity  . Alcohol use: No    Alcohol/week: 0.0 oz    Frequency: Never    Comment: rare  . Drug use: No  . Sexual activity: Not on file  Lifestyle  . Physical activity:    Days per week: Not on file    Minutes per session: Not on file  . Stress: Not on file  Relationships  . Social connections:    Talks on phone: Not on file    Gets together: Not on file    Attends religious service: Not on file    Active member of club or organization: Not on file    Attends meetings of clubs or organizations: Not on file    Relationship status: Not on file  . Intimate partner violence:    Fear of current or ex partner: Not on file    Emotionally abused: Not on file    Physically abused: Not on file    Forced sexual activity: Not on file  Other Topics Concern  . Not on file  Social History Narrative  . Not on file     No Known Allergies  Family History  Problem Relation Age of Onset  . Diabetes Mellitus II Mother   . Diabetes Mother   . Colon cancer Mother       Prior to Admission medications   Medication Sig Start Date End Date Taking? Authorizing Provider  aspirin EC 81 MG tablet Take 1 tablet (81 mg total) at bedtime by mouth. 02/24/17  Yes Rehman, Mechele Dawley, MD  atorvastatin (LIPITOR) 80 MG tablet Take 40 mg daily by mouth.   Yes [provider]  cloNIDine (CATAPRES - DOSED IN MG/24 HR) 0.2 mg/24hr patch Place 0.2 mg onto the skin once a week.  02/28/17  Yes [provider]  insulin glargine (LANTUS) 100 UNIT/ML injection Inject 20 Units into the skin every other day. In the morning   Yes [provider]  sevelamer carbonate (RENVELA) 800 MG tablet Take 800-1,600 mg by mouth See admin instructions. Take 2 tablets (1600 mg) by mouth with meals and 1 tablet (800 mg) with snacks   Yes [provider]  sitaGLIPtin (JANUVIA) 100 MG tablet Take 100 mg by mouth at bedtime.    Yes [provider]  timolol (TIMOPTIC) 0.5  % ophthalmic solution Place 1 drop into both eyes 2 (two) times daily. 02/13/17  Yes [provider]    Physical Exam:  Vitals:   07/10/17 1005 07/10/17 1008 07/10/17 1010 07/10/17 1011  BP: (!) 192/84  (!) 190/92   Pulse: (!) 109 (!) 106 (!) 106 (!) 104  Resp: (!) 22 (!) 23 (!) 22 (!) 25  Temp:      TempSrc:  SpO2: 96% 95% 95% 96%  Weight:      Height:       Constitutional: NAD, calm, more comfortable with 5 L of oxygen, ill-appearing. Eyes: Patient is blind bilaterally due to glaucoma lids and conjunctivae normal, no periorbital edema ENMT: Mucous membranes are moist, without exudate or lesions  Neck: normal, supple, no masses, no thyromegaly.  Patient has a right temporary cath on the neck Respiratory: Very decreased breath sounds bilaterally, due to fluid overload.  No wheezing is audible.  Mild crackles are audible.  Moderate respiratory effort, no accessory muscle use.  Patient is mildly tachypneic Cardiovascular: Regular rate and rhythm,  murmur, rubs or gallops. No extremity edema on the right.  The patient has left BKA. 2+ pedal pulses. No carotid bruits.  Abdomen: Soft, non tender, No hepatosplenomegaly. Bowel sounds positive.  Musculoskeletal: no clubbing / cyanosis. Moves all extremities (L BKA) Skin: no jaundice, No lesions.  Neurologic: Sensation intact  Strength equal in all extremities (left BKA) Psychiatric:   Alert and oriented x 3.  Somewhat anxious due to shortness of breath    Labs on Admission: I have personally reviewed following labs and imaging studies  CBC: Recent Labs  Lab 07/10/17 0650 07/10/17 1004  WBC 5.7  --   HGB 9.1* 11.2*  HCT 27.3* 33.0*  MCV 92.2  --   PLT 261  --     Basic Metabolic Panel: Recent Labs  Lab 07/10/17 0650 07/10/17 1004  NA  --  144  K  --  4.0  GLUCOSE  --  207*  CREATININE 14.46*  --     GFR: Estimated Creatinine Clearance: 5.5 mL/min (A) (by C-G formula based on SCr of 14.46 mg/dL  (H)).  Liver Function Tests: No results for input(s): AST, ALT, ALKPHOS, BILITOT, PROT, ALBUMIN in the last 168 hours. No results for input(s): LIPASE, AMYLASE in the last 168 hours. No results for input(s): AMMONIA in the last 168 hours.  Coagulation Profile: Recent Labs  Lab 07/10/17 0650  INR 0.96    Cardiac Enzymes: No results for input(s): CKTOTAL, CKMB, CKMBINDEX, TROPONINI in the last 168 hours.  BNP (last 3 results) No results for input(s): PROBNP in the last 8760 hours.  HbA1C: No results for input(s): HGBA1C in the last 72 hours.  CBG: Recent Labs  Lab 07/10/17 0625  GLUCAP 137*    Lipid Profile: No results for input(s): CHOL, HDL, LDLCALC, TRIG, CHOLHDL, LDLDIRECT in the last 72 hours.  Thyroid Function Tests: No results for input(s): TSH, T4TOTAL, FREET4, T3FREE, THYROIDAB in the last 72 hours.  Anemia Panel: No results for input(s): VITAMINB12, FOLATE, FERRITIN, TIBC, IRON, RETICCTPCT in the last 72 hours.  Urine analysis: No results found for: COLORURINE, APPEARANCEUR, LABSPEC, PHURINE, GLUCOSEU, HGBUR, BILIRUBINUR, KETONESUR, PROTEINUR, UROBILINOGEN, NITRITE, LEUKOCYTESUR  Sepsis Labs: @LABRCNTIP (procalcitonin:4,lacticidven:4) )No results found for this or any previous visit (from the past 240 hour(s)).   Radiological Exams on Admission: No results found.  EKG: Independently reviewed.  Assessment/Plan Principal Problem:   Fluid overload Active Problems:   Acute respiratory failure with hypoxia (HCC)   Anemia   ESRD (end stage renal disease) (HCC)   Acute respiratory failure (HCC)   Sarcoidosis   Hyperlipidemia   Peripheral vascular disease (HCC)     ESRD with volume overload due to missing dialysis today. on dialysis Monday Wednesday and Friday, as mentioned above, last dialysis on 3/22, now that a temporary catheter has been inserted, she will undergo dialysis this afternoon hopefully with  improvement of his symptoms.  His main  nephrologist is Dr. Lowanda Foster.  Creatinine is 14.46.   Plans for tunneled catheter insertion tomorrow.  Admitted to telemetry observation Will defer diuresis to Renal Service BMET in am Continue OP meds after dialysis  Acute respiratory failure with hypoxia due to above, in a patient with history of CHF, Sarcoidosis    He initially needed  BiPAP now on 5 L O2 with better control of symptoms BNP and troponin, EKG pending  Current weight 172 lb  Afebrile. WBC 6.7     Last 2D echo was in 2016, EF 88-50%, grade 1 diastolic,  normal systolic  monitor I/Os daily weights prn 02 The patient may need a 2D echo pending on cardiac results Continue oxygen 5 L at this time until respiratory status improves CXR   Type II Diabetes Current blood sugar level is  No results found for: HGBA1C Hgb A1C [place] , SSI  Hypertension BP  190/92   Pulse  104    Hold  home anti-hypertensive medications for HD, can resume thereafter   Hyperlipidemia Continue home statins   Anemia of chronic disease Hemoglobin on admission 11.2. Baseline Hb 9.6.  No evidence of bleeding Repeat CBC in am  No transfusion is indicated at this time   Glaucoma, visually impaired Continue Timolol   History of PVD s/p LLE BKA  PT and OT   DVT prophylaxis:  Heparin sq Code Status:    Full  Family Communication:  Discussed with patient and wife Disposition Plan: Expect patient to be discharged to home after condition improves Consults called:    Nephrology Admission status:  Tele OBs    Sharene Butters, PA-C [facility]   Amion text  [phone]   07/10/2017, 12:47 PM

## 2017-07-10 NOTE — Progress Notes (Signed)
Critical Lab Trop-0.61. MD made aware. Awaiting orders

## 2017-07-10 NOTE — Sedation Documentation (Signed)
Pt weaned back to nasal cannula on 5L saturation =94

## 2017-07-10 NOTE — Progress Notes (Signed)
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

## 2017-07-10 NOTE — Consult Note (Signed)
Reason for Consult: To manage dialysis and dialysis related needs Referring Physician: Dr. Rob Hickman Wayne [name] is an 67 y.o. male.  HPI: Pt is a 56M with ESRD on HD who dialyzes at [facility] who is now seen in consultation at the request of Dr. Kathlene Cote for eval and management of vol overload in the setting of ESRD.  Pt normally dialyzes MWF but missed Monday due to clotted R loop AVG.  This is the second time it's clotted recently.  Was going for tunneled HD cath placement today but became unstable- had sat in the high 70s laying flat, increased WOB.  Temp cath placed and we are asked to see.   Currently pt reports SOB.  He reports that his usual treatment time is 3 hrs of 3 hrs 15 min.  Records have been requested.    Past Medical History:  Diagnosis Date  . Anemia of chronic disease   . ESRD (end stage renal disease) ([place])    Hemodialysis since December 2015  . Essential hypertension   . GERD (gastroesophageal reflux disease)    PMH:   . Hypercholesteremia   . Peripheral vascular disease ([place])   . Renal insufficiency   . Sarcoidosis   . Type 2 diabetes mellitus ([place])     Past Surgical History:  Procedure Laterality Date  . AMPUTATION OF REPLICATED TOES Right 7628   3rd, 4th, 5th digits (toes) of right foot  . AV FISTULA PLACEMENT Left 12/31/2012   Procedure: ARTERIOVENOUS (AV) FISTULA CREATION-LEFT;  Surgeon: Angelia Mould, MD;  Location: [place];  Service: Vascular;  Laterality: Left;  . AV FISTULA PLACEMENT Right 11/03/2014   Procedure: INSERTION OF RIGHT ARM  ARTERIOVENOUS (AV) GORE-TEX GRAFT;  Surgeon: Angelia Mould, MD;  Location: [place];  Service: Vascular;  Laterality: Right;  . BELOW KNEE LEG AMPUTATION Left   . COLONOSCOPY    . COLONOSCOPY N/A 02/22/2017   Procedure: COLONOSCOPY;  Surgeon: Rogene Houston, MD;  [facility];  Service: Endoscopy;  Laterality: N/A;  830  . ESOPHAGEAL DILATION     8-10 yrs ago   . EYE SURGERY Right   .  FISTULOGRAM N/A 07/27/2014   Procedure: FISTULOGRAM;  Surgeon: Angelia Mould, MD;  [facility] C[facility] LAB;  Service: Cardiovascular;  Laterality: N/A;    Family History  Problem Relation Age of Onset  . Diabetes Mellitus II Mother   . Diabetes Mother   . Colon cancer Mother     Social History:  reports that he quit smoking about 19 years ago. His smoking use included cigarettes. He has quit using smokeless tobacco. He reports that he does not drink alcohol or use drugs.  Allergies: No Known Allergies  Medications:  Scheduled: . [START ON 07/11/2017] aspirin EC  81 mg Oral QHS  . atorvastatin  40 mg Oral Daily  . heparin      . heparin  5,000 Units Subcutaneous Q8H  . insulin aspart  0-9 Units Subcutaneous TID WC  . insulin glargine  20 Units Subcutaneous QODAY  . lidocaine      . sevelamer carbonate  800-1,600 mg Oral See admin instructions  . timolol  1 drop Both Eyes BID     Results for orders placed or performed during the hospital encounter of 07/10/17 (from the past 48 hour(s))  Glucose, capillary     Status: Abnormal   Collection Time: 07/10/17  6:25 AM  Result Value Ref Range   Glucose-Capillary 137 (H)  65 - 99 mg/dL  CBC     Status: Abnormal   Collection Time: 07/10/17  6:50 AM  Result Value Ref Range   WBC 5.7 4.0 - 10.5 K/uL   RBC 2.96 (L) 4.22 - 5.81 MIL/uL   Hemoglobin 9.1 (L) 13.0 - 17.0 g/dL   HCT 27.3 (L) 39.0 - 52.0 %   MCV 92.2 78.0 - 100.0 fL   MCH 30.7 26.0 - 34.0 pg   MCHC 33.3 30.0 - 36.0 g/dL   RDW 14.4 11.5 - 15.5 %   Platelets 261 150 - 400 K/uL    Comment: Performed at [facility], [place] [address], [place], [place] [address]  Creatinine, serum     Status: Abnormal   Collection Time: 07/10/17  6:50 AM  Result Value Ref Range   Creatinine, Ser 14.46 (H) 0.61 - 1.24 mg/dL   GFR calc non Af Amer 3 (L) >60 mL/min   GFR calc Af Amer 4 (L) >60 mL/min    Comment: (NOTE) The eGFR has been calculated using the CKD EPI equation. This  calculation has not been validated in all clinical situations. eGFR's persistently <60 mL/min signify possible Chronic Kidney Disease. Performed at [facility], [place] [address], [place],  [address]   Protime-INR     Status: None   Collection Time: 07/10/17  6:50 AM  Result Value Ref Range   Prothrombin Time 12.7 11.4 - 15.2 seconds   INR 0.96     Comment: Performed at [facility] [address] [place], [place] [address]  I-STAT 4, (NA,K, GLUC, HGB,HCT)     Status: Abnormal   Collection Time: 07/10/17 10:04 AM  Result Value Ref Range   Sodium 144 135 - 145 mmol/L   Potassium 4.0 3.5 - 5.1 mmol/L   Glucose, Bld 207 (H) 65 - 99 mg/dL   HCT 33.0 (L) 39.0 - 52.0 %   Hemoglobin 11.2 (L) 13.0 - 17.0 g/dL    No results found.  ROS: all other systems reviewed and are negative except as per HPI Blood pressure (!) 190/92, pulse (!) 104, temperature 98.2 F (36.8 C), temperature source Oral, resp. rate (!) 25, height 6' 1"  (1.854 m), weight 78 kg (172 lb), SpO2 96 %. .  GEN older gentleman, in mild respiratory distress HEENT sclerae anicteric NECK + JVD to angle of mandible PULM increased WOB, wearing O2, crackles 2/3 way up lung fields CV tachycardic with + S3 ABD benign EXT trace RLE edema, LLE BKA NEURO AAO x 3 SKIN no rashes MSK no effusions ACCESS: R forearm AVG no thrill/ bruit, NONTUNNELED R IJ HD cath in place  Assessment/Plan: 1 Acute hypoxic RF, vol overload: missed HD Monday, will provide emergent HD today and then tomorrow to get back on regular schedule.  Healthy amount of UF.   2 ESRD: MWF [facility], getting records.  If rx time is truly only 3 hrs may need time increase for OP.   3.  Clotted AVG: for [facility] tomorrow with IR.  He is to be admitted overnight with hospitalist service for serial dialysis.  He has a nontunneled HD cath in place. 3 Hypertension: Expect to improve with UF.   4. Anemia of ESRD: Hgb 11.2 today, will give ESA as  appropriate 5. Metabolic Bone Disease: getting records  Wayne [name], Benjamine Mola 07/10/2017, 1:01 PM

## 2017-07-10 NOTE — Sedation Documentation (Signed)
Patient is resting comfortably. 

## 2017-07-10 NOTE — Procedures (Signed)
Patient seen and examined on Hemodialysis. QB 400 mL/ min via R IJ nontunneled HD cath UF goal 4L net.  Tolerating rx well so far- good UF goal for vol removal and correction of acute hypoxic RF  Treatment adjusted as needed.  Madelon Lips MD [facility] pgr [phone] 1:19 PM

## 2017-07-10 NOTE — Sedation Documentation (Signed)
Pt experiencing respiratory distress, diaphoresis, anxiety. Placed on NRB. At this point MD made the decision to place temporary catheter in order for pt to have  dialyses very soon. ISTAT; K+= 4.0 Na=144 Glu=207

## 2017-07-10 NOTE — Sedation Documentation (Signed)
Pt waiting in radiology for a placement in dialysis. Saturation = 92 on 5L [place]

## 2017-07-10 NOTE — H&P (Signed)
Chief Complaint: Patient was seen in consultation today for tunneled dialysis catheter placement at the request of [place]  Referring Physician(s): Fran Lowes  Supervising Physician: Aletta Edouard  Patient Status: Wayne [name] - Out-pt  History of Present Illness: Wayne [name] is a 67 y.o. male   ESRD Rt arm graft is approx 67 yrs old (11/03/14) Just declotted at [place] 07/02/17 Ran well Tues Wed and Fri that week By Fri (3/22)at 200 pm was clotted per pt.  No interventions in chart from IR  Plan for tunneled HD catheter placement   Past Medical History:  Diagnosis Date  . Anemia of chronic disease   . ESRD (end stage renal disease) ([place])    Hemodialysis since December 2015  . Essential hypertension   . GERD (gastroesophageal reflux disease)    PMH:   . Hypercholesteremia   . Peripheral vascular disease ([place])   . Renal insufficiency   . Sarcoidosis   . Type 2 diabetes mellitus ([place])     Past Surgical History:  Procedure Laterality Date  . AMPUTATION OF REPLICATED TOES Right 3825   3rd, 4th, 5th digits (toes) of right foot  . AV FISTULA PLACEMENT Left 12/31/2012   Procedure: ARTERIOVENOUS (AV) FISTULA CREATION-LEFT;  Surgeon: Angelia Mould, MD;  Location: [place];  Service: Vascular;  Laterality: Left;  . AV FISTULA PLACEMENT Right 11/03/2014   Procedure: INSERTION OF RIGHT ARM  ARTERIOVENOUS (AV) GORE-TEX GRAFT;  Surgeon: Angelia Mould, MD;  Location: [place];  Service: Vascular;  Laterality: Right;  . BELOW KNEE LEG AMPUTATION Left   . COLONOSCOPY    . COLONOSCOPY N/A 02/22/2017   Procedure: COLONOSCOPY;  Surgeon: Rogene Houston, MD;  [facility];  Service: Endoscopy;  Laterality: N/A;  830  . ESOPHAGEAL DILATION     8-10 yrs ago   . EYE SURGERY Right   . FISTULOGRAM N/A 07/27/2014   Procedure: FISTULOGRAM;  Surgeon: Angelia Mould, MD;  Location: [facility] CATH LAB;  Service: Cardiovascular;  Laterality: N/A;     Allergies: Patient has no known allergies.  Medications: Prior to Admission medications   Medication Sig Start Date End Date Taking? Authorizing Provider  aspirin EC 81 MG tablet Take 1 tablet (81 mg total) at bedtime by mouth. 02/24/17  Yes Rehman, Mechele Dawley, MD  atorvastatin (LIPITOR) 80 MG tablet Take 40 mg daily by mouth.   Yes [provider]  cloNIDine (CATAPRES - DOSED IN MG/24 HR) 0.2 mg/24hr patch Place 0.2 mg onto the skin once a week.  02/28/17  Yes [provider]  insulin glargine (LANTUS) 100 UNIT/ML injection Inject 20 Units into the skin every other day. In the morning   Yes [provider]  sevelamer carbonate (RENVELA) 800 MG tablet Take 800-1,600 mg by mouth See admin instructions. Take 2 tablets (1600 mg) by mouth with meals and 1 tablet (800 mg) with snacks   Yes [provider]  sitaGLIPtin (JANUVIA) 100 MG tablet Take 100 mg by mouth at bedtime.    Yes [provider]  timolol (TIMOPTIC) 0.5 % ophthalmic solution Place 1 drop into both eyes 2 (two) times daily. 02/13/17  Yes [provider]     Family History  Problem Relation Age of Onset  . Diabetes Mellitus II Mother   . Diabetes Mother   . Colon cancer Mother     Social History   Socioeconomic History  . Marital status: Married    Spouse name: Not on file  .  Number of children: Not on file  . Years of education: Not on file  . Highest education level: Not on file  Occupational History  . Not on file  Social Needs  . Financial resource strain: Not on file  . Food insecurity:    Worry: Not on file    Inability: Not on file  . Transportation needs:    Medical: Not on file    Non-medical: Not on file  Tobacco Use  . Smoking status: Former Smoker    Types: Cigarettes    Last attempt to quit: 04/17/1998    Years since quitting: 19.2  . Smokeless tobacco: Former Network engineer and Sexual Activity  . Alcohol use: No    Alcohol/week: 0.0 oz     Frequency: Never    Comment: rare  . Drug use: No  . Sexual activity: Not on file  Lifestyle  . Physical activity:    Days per week: Not on file    Minutes per session: Not on file  . Stress: Not on file  Relationships  . Social connections:    Talks on phone: Not on file    Gets together: Not on file    Attends religious service: Not on file    Active member of club or organization: Not on file    Attends meetings of clubs or organizations: Not on file    Relationship status: Not on file  Other Topics Concern  . Not on file  Social History Narrative  . Not on file    Review of Systems: A 12 point ROS discussed and pertinent positives are indicated in the HPI above.  All other systems are negative.  Review of Systems  Constitutional: Positive for fatigue. Negative for activity change, appetite change and fever.  Eyes:       Blind  Respiratory: Negative for cough and shortness of breath.   Cardiovascular: Negative for chest pain.  Gastrointestinal: Negative for abdominal pain.  Neurological: Positive for weakness.  Psychiatric/Behavioral: Negative for behavioral problems and confusion.    Vital Signs: BP (!) 164/39 (BP Location: Right Arm)   Pulse 98   Temp 98.2 F (36.8 C) (Oral)   Ht 6\' 1"  (1.854 m)   Wt 172 lb (78 kg)   SpO2 91%   BMI 22.69 kg/m   Physical Exam  Constitutional: He is oriented to person, place, and time.  Cardiovascular: Normal rate and regular rhythm.  Musculoskeletal: Normal range of motion.  L BKA Rt forearm graft no pulse or thrill  Neurological: He is alert and oriented to person, place, and time.  Skin: Skin is warm and dry.  Psychiatric: He has a normal mood and affect. His behavior is normal. Judgment and thought content normal.  Nursing note and vitals reviewed.   Imaging: Dg Chest 2 View  Result Date: 06/29/2017 CLINICAL DATA:  Shortness of breath and left-sided chest pain. EXAM: CHEST - 2 VIEW COMPARISON:  Chest x-ray dated  May 14, 2012. FINDINGS: The heart size and mediastinal contours are within normal limits. Calcified mediastinal and supraclavicular lymph nodes are again noted. Vascular congestion with diffusely increased interstitial markings, greater on the right. Hazy opacification in the right lower lobe. Small right pleural effusion. No acute osseous abnormality. IMPRESSION: 1. Vascular congestion with asymmetric right greater than left interstitial edema. Hazy opacification in the right lower lobe could reflect asymmetric alveolar edema versus infiltrate. 2. Small right pleural effusion. Electronically Signed   By: Orville Govern.D.  On: 06/29/2017 09:07    Labs:  CBC: Recent Labs    03/14/17 2151 06/29/17 0804 06/30/17 0704 07/10/17 0650  WBC 4.3 7.6 6.9 5.7  HGB 11.2* 9.6* 9.2* 9.1*  HCT 34.5* 29.7* 28.0* 27.3*  PLT 147* 170 160 261    COAGS: Recent Labs    07/10/17 0650  INR 0.96    BMP: Recent Labs    03/14/17 2151 06/29/17 0804 06/30/17 0704 07/10/17 0650  NA 134* 143 144  --   K 5.0 4.4 4.2  --   CL 97* 103 104  --   CO2 29 21* 24  --   GLUCOSE 196* 185* 114*  --   BUN 21* 62* 52*  --   CALCIUM 8.0* 9.3 9.4  --   CREATININE 7.61* 14.91* 13.76* 14.46*  GFRNONAA 7* 3* 3* 3*  GFRAA 8* 3* 4* 4*    LIVER FUNCTION TESTS: Recent Labs    06/30/17 0704  ALBUMIN 3.6    TUMOR MARKERS: No results for input(s): AFPTM, CEA, CA199, CHROMGRNA in the last 8760 hours.  Assessment and Plan:  Clotted Rt arm dialysis graft after declot 07/02/17 with [facility] Scheduled now for tunneled Hemodialysis catheter placement Risks and benefits discussed with the patient including, but not limited to bleeding, infection, vascular injury, pneumothorax which may require chest tube placement, air embolism or even death  All of the patient's questions were answered, patient is agreeable to proceed. Consent signed and in chart.   Thank you for this interesting consult.  I greatly  enjoyed meeting Ashaun Gaughan and look forward to participating in their care.  A copy of this report was sent to the requesting provider on this date.  Electronically Signed: Lavonia Drafts, PA-C 07/10/2017, 8:51 AM   I spent a total of  30 Minutes   in face to face in clinical consultation, greater than 50% of which was counseling/coordinating care for HD cath- tunneled

## 2017-07-10 NOTE — Procedures (Signed)
Interventional Radiology Procedure Note  Procedure: Right IJ temp HD catheter placement  Complications: None  Estimated Blood Loss: < 10 mL  Findings: Patient significantly hypoxic in supine position, requiring non-rebreathing mask O2.  Agitated, tachypneic and hypertensive. Unable to safely sedate for tunneled catheter and temporary 20 cm [place] catheter placed with tip in RA.  OK to use. Will check to see if can be dialyzed in hospital today.  Venetia Night. Kathlene Cote, M.D Pager:  [phone]

## 2017-07-11 ENCOUNTER — Observation Stay (HOSPITAL_COMMUNITY): Payer: Medicare Other

## 2017-07-11 ENCOUNTER — Encounter (HOSPITAL_COMMUNITY): Payer: Self-pay | Admitting: Interventional Radiology

## 2017-07-11 DIAGNOSIS — N185 Chronic kidney disease, stage 5: Secondary | ICD-10-CM

## 2017-07-11 DIAGNOSIS — J9601 Acute respiratory failure with hypoxia: Secondary | ICD-10-CM

## 2017-07-11 DIAGNOSIS — E1122 Type 2 diabetes mellitus with diabetic chronic kidney disease: Secondary | ICD-10-CM

## 2017-07-11 DIAGNOSIS — N186 End stage renal disease: Secondary | ICD-10-CM

## 2017-07-11 DIAGNOSIS — E8770 Fluid overload, unspecified: Secondary | ICD-10-CM

## 2017-07-11 DIAGNOSIS — I739 Peripheral vascular disease, unspecified: Secondary | ICD-10-CM | POA: Diagnosis not present

## 2017-07-11 HISTORY — PX: IR FLUORO GUIDE CV LINE RIGHT: IMG2283

## 2017-07-11 LAB — BASIC METABOLIC PANEL
Anion gap: 13 (ref 5–15)
BUN: 28 mg/dL — ABNORMAL HIGH (ref 6–20)
CALCIUM: 8.6 mg/dL — AB (ref 8.9–10.3)
CHLORIDE: 100 mmol/L — AB (ref 101–111)
CO2: 26 mmol/L (ref 22–32)
CREATININE: 9.05 mg/dL — AB (ref 0.61–1.24)
GFR, EST AFRICAN AMERICAN: 6 mL/min — AB (ref >60)
GFR, EST NON AFRICAN AMERICAN: 5 mL/min — AB (ref >60)
Glucose, Bld: 89 mg/dL (ref 65–99)
Potassium: 4 mmol/L (ref 3.5–5.1)
SODIUM: 139 mmol/L (ref 135–145)

## 2017-07-11 LAB — CBC
HCT: 25.2 % — ABNORMAL LOW (ref 39.0–52.0)
Hemoglobin: 8.6 g/dL — ABNORMAL LOW (ref 13.0–17.0)
MCH: 31.2 pg (ref 26.0–34.0)
MCHC: 34.1 g/dL (ref 30.0–36.0)
MCV: 91.3 fL (ref 78.0–100.0)
PLATELETS: 181 10*3/uL (ref 150–400)
RBC: 2.76 MIL/uL — AB (ref 4.22–5.81)
RDW: 14.3 % (ref 11.5–15.5)
WBC: 5.7 10*3/uL (ref 4.0–10.5)

## 2017-07-11 LAB — GLUCOSE, CAPILLARY
GLUCOSE-CAPILLARY: 91 mg/dL (ref 65–99)
Glucose-Capillary: 130 mg/dL — ABNORMAL HIGH (ref 65–99)
Glucose-Capillary: 182 mg/dL — ABNORMAL HIGH (ref 65–99)

## 2017-07-11 LAB — PROTIME-INR
INR: 1.07
PROTHROMBIN TIME: 13.8 s (ref 11.4–15.2)

## 2017-07-11 MED ORDER — CEFAZOLIN SODIUM-DEXTROSE 2-4 GM/100ML-% IV SOLN
INTRAVENOUS | Status: AC
  Filled 2017-07-11: qty 100

## 2017-07-11 MED ORDER — LIDOCAINE HCL 1 % IJ SOLN
INTRAMUSCULAR | Status: AC
  Filled 2017-07-11: qty 20

## 2017-07-11 MED ORDER — CHLORHEXIDINE GLUCONATE 4 % EX LIQD
CUTANEOUS | Status: AC
  Filled 2017-07-11: qty 15

## 2017-07-11 MED ORDER — CEFAZOLIN SODIUM-DEXTROSE 2-4 GM/100ML-% IV SOLN
2.0000 g | INTRAVENOUS | Status: AC
  Administered 2017-07-11: 2 g via INTRAVENOUS
  Filled 2017-07-11: qty 100

## 2017-07-11 MED ORDER — FENTANYL CITRATE (PF) 100 MCG/2ML IJ SOLN
INTRAMUSCULAR | Status: AC
  Filled 2017-07-11: qty 4

## 2017-07-11 MED ORDER — MIDAZOLAM HCL 2 MG/2ML IJ SOLN
INTRAMUSCULAR | Status: AC
  Filled 2017-07-11: qty 4

## 2017-07-11 MED ORDER — LIDOCAINE HCL (PF) 1 % IJ SOLN
INTRAMUSCULAR | Status: AC | PRN
  Administered 2017-07-11: 10 mL

## 2017-07-11 MED ORDER — AMLODIPINE BESYLATE 5 MG PO TABS
5.0000 mg | ORAL_TABLET | Freq: Every day | ORAL | Status: DC
  Administered 2017-07-11: 5 mg via ORAL
  Filled 2017-07-11: qty 1

## 2017-07-11 MED ORDER — HEPARIN SODIUM (PORCINE) 1000 UNIT/ML IJ SOLN
INTRAMUSCULAR | Status: AC
  Filled 2017-07-11: qty 1

## 2017-07-11 MED ORDER — MIDAZOLAM HCL 2 MG/2ML IJ SOLN
INTRAMUSCULAR | Status: AC | PRN
  Administered 2017-07-11 (×2): 1 mg via INTRAVENOUS

## 2017-07-11 MED ORDER — HYDRALAZINE HCL 20 MG/ML IJ SOLN
5.0000 mg | Freq: Four times a day (QID) | INTRAMUSCULAR | Status: DC | PRN
  Administered 2017-07-11: 5 mg via INTRAVENOUS
  Filled 2017-07-11: qty 1

## 2017-07-11 MED ORDER — FENTANYL CITRATE (PF) 100 MCG/2ML IJ SOLN
INTRAMUSCULAR | Status: AC | PRN
  Administered 2017-07-11: 50 ug via INTRAVENOUS
  Administered 2017-07-11 (×2): 25 ug via INTRAVENOUS

## 2017-07-11 NOTE — Sedation Documentation (Signed)
Patient is resting comfortably. 

## 2017-07-11 NOTE — Progress Notes (Signed)
  Wayne [name] Progress Note   Assessment/ Plan:   Assessment/Plan: 1 Acute hypoxic RF, vol overload: missed HD Monday, s/p  emergent HD 3/26, regular schedule 3/27 to get back on regular schedule.  Healthy amount of UF.   2 ESRD: MWF [facility], getting records- none received yet.  If rx time is truly only 3 hrs may need time increase for OP HD.   3.  Clotted AVG: s/p TDC with IR this AM. Will need OP followup with [facility] (Dr. Scot Dock placed AVG per pt) to discuss access options 3 Hypertension: Expect to improve with UF.   4. Anemia of ESRD: Hgb 11.2 today, will give ESA as appropriate 5. Metabolic Bone Disease: getting records 6.  Dispo: OK for d/c home after HD today from renal perspective   Subjective:    No acute events.  S/p R IJ TDC with IR today.  For HD today.   Objective:   BP (!) 154/74 (BP Location: Left Arm)   Pulse 90   Temp 98 F (36.7 C) (Oral)   Resp 19   Ht 6\' 1"  (1.854 m)   Wt 69.5 kg (153 lb 3.5 oz)   SpO2 98%   BMI 20.21 kg/m   Physical Exam: GEN older gentleman, NAD, a little sleepy after sedation this AM HEENT sclerae anicteric NECK JVD much improved PULM off O2, normal WOB, clear CV RRR soft systolic murmur ABD benign EXT no RLE edema, LLE BKA NEURO AAO x 3 SKIN no rashes MSK no effusions ACCESS: R forearm AVG no thrill/ bruit, tunneled R IJ HD cath in place    Labs: BMET Recent Labs  Lab 07/10/17 0650 07/10/17 1004 07/10/17 2135 07/11/17 0630  NA  --  144 138 139  K  --  4.0 4.0 4.0  CL  --   --  100* 100*  CO2  --   --  23 26  GLUCOSE  --  207* 120* 89  BUN  --   --  21* 28*  CREATININE 14.46*  --  7.91* 9.05*  CALCIUM  --   --  9.0 8.6*  PHOS  --   --  3.2  --    CBC Recent Labs  Lab 07/10/17 0650 07/10/17 1004 07/11/17 0630  WBC 5.7  --  5.7  HGB 9.1* 11.2* 8.6*  HCT 27.3* 33.0* 25.2*  MCV 92.2  --  91.3  PLT 261  --  181    @IMGRELPRIORS @ Medications:    . amLODipine  5 mg Oral Daily  .  aspirin EC  81 mg Oral QHS  . atorvastatin  40 mg Oral Daily  . chlorhexidine      . heparin      . heparin  5,000 Units Subcutaneous Q8H  . insulin aspart  0-9 Units Subcutaneous TID WC  . lidocaine      . sevelamer carbonate  1,600 mg Oral TID WC  . timolol  1 drop Both Eyes BID     Madelon Lips, MD [facility] pgr [phone] 07/11/2017, 11:42 AM

## 2017-07-11 NOTE — Procedures (Signed)
RIJV HD cath SVC RA EBL 0 Comp 0 

## 2017-07-11 NOTE — Progress Notes (Signed)
   Patient Status: [facility] - In-pt  Assessment and Plan: Patient in need of tunneled dialysis catheter for outpatient HD.  Patient initially presented for procedure yesterday but was unable to tolerate.   He was admitted overnight, got HD, and feels much better today.  Ready to proceed with conversion of temp cath to tunneled cath today.   ______________________________________________________________________   History of Present Illness: Wayne [name] is a 67 y.o. male with past medical history of end stage renal disease in need of access for outpatient dialysis.  Plan was for a tunneled catheter to be placed yesterday however, patient fluid overloaded and unable to undergo procedure. He was admitted overnight after a temporary HD catheter was placed.  He received dialysis overnight and has improved.  Ready for tunneled catheter placement today.   Allergies and medications reviewed.   Review of Systems: A 12 point ROS discussed and pertinent positives are indicated in the HPI above.  All other systems are negative.  Review of Systems  Constitutional: Negative for fatigue and fever.  Respiratory: Negative for cough and shortness of breath.   Cardiovascular: Negative for chest pain.  Gastrointestinal: Negative for abdominal pain.  Psychiatric/Behavioral: Negative for behavioral problems and confusion.    Vital Signs: BP (!) 178/74 (BP Location: Left Arm)   Pulse 67   Temp 98 F (36.7 C) (Oral)   Resp 18   Ht 6\' 1"  (1.854 m)   Wt 153 lb 3.5 oz (69.5 kg)   SpO2 95%   BMI 20.21 kg/m   Physical Exam  Constitutional: He is oriented to person, place, and time. He appears well-developed.  Cardiovascular: Normal rate, regular rhythm and normal heart sounds.  Pulmonary/Chest: Effort normal and breath sounds normal. No respiratory distress.  Neurological: He is alert and oriented to person, place, and time.  Skin: Skin is warm and dry.  Psychiatric: He has a normal mood and affect.  His behavior is normal. Judgment and thought content normal.  Nursing note and vitals reviewed.  Imaging reviewed.   Labs:  COAGS: Recent Labs    07/10/17 0650 07/11/17 0630  INR 0.96 1.07    BMP: Recent Labs    06/29/17 0804 06/30/17 0704 07/10/17 0650 07/10/17 1004 07/10/17 2135 07/11/17 0630  NA 143 144  --  144 138 139  K 4.4 4.2  --  4.0 4.0 4.0  CL 103 104  --   --  100* 100*  CO2 21* 24  --   --  23 26  GLUCOSE 185* 114*  --  207* 120* 89  BUN 62* 52*  --   --  21* 28*  CALCIUM 9.3 9.4  --   --  9.0 8.6*  CREATININE 14.91* 13.76* 14.46*  --  7.91* 9.05*  GFRNONAA 3* 3* 3*  --  6* 5*  GFRAA 3* 4* 4*  --  7* 6*       Electronically Signed: Docia Barrier, PA 07/11/2017, 8:58 AM   I spent a total of 15 minutes in face to face in clinical consultation, greater than 50% of which was counseling/coordinating care for renal failure.

## 2017-07-11 NOTE — Discharge Summary (Signed)
Physician Discharge Summary  Wayne [name] ENI:[id number] DOB: 1951-02-15 DOA: 07/10/2017  PCP: Rosita Fire, MD  Admit date: 07/10/2017 Discharge date: 07/11/2017  Admitted From: Home Disposition: Home  Recommendations for Outpatient Follow-up:  1. Follow up with PCP in 1 week 2. Follow-up with outpatient dialysis as scheduled   Home Health: No Equipment/Devices: None  Discharge Condition: Stable CODE STATUS: Full Diet recommendation: Heart Healthy / Carb Modified /renal hemodialysis diet with fluid restriction up to 1200 cc a day  Brief/Interim Summary: 67 year old male with past medical history of diabetes mellitus, sarcoidosis, end-stage renal disease on hemodialysis, peripheral vascular disease status post left BKA, hyperlipidemia and hypertension presented with volume overload secondary to missed hemodialysis because of poor access.  Patient underwent tunnel catheter placement by IR today.  He will have dialysis today and he will be discharged after dialysis.  Nephrology has cleared the patient for discharge.  His shortness of breath has improved.  Outpatient follow-up with outpatient dialysis as scheduled.  Discharge Diagnoses:  Principal Problem:   Fluid overload Active Problems:   Anemia   Acute respiratory failure with hypoxia (HCC)   ESRD (end stage renal disease) (HCC)   Acute respiratory failure (HCC)   Sarcoidosis   Hyperlipidemia   Peripheral vascular disease (HCC)  Volume overload secondary to missed dialysis secondary to poor access -Status post dialysis yesterday and plan for dialysis today -Nephrology following.  Nephrology has cleared the patient for discharge after dialysis today -Status post IR guided tunneled hemodialysis catheter placement today -Currently respiratory status stable.  Acute respiratory failure with hypoxia -Probably secondary to above. -Resolved.  Currently on room air  Mildly positive troponin -Probably secondary to above.  No  further inpatient intervention needed.  Outpatient follow-up  End-stage renal disease on hemodialysis -Continue hemodialysis as an outpatient  Diabetes mellitus type 2 -Outpatient follow-up.  Continue home regimen  Hypertension -Blood pressure on the higher side.  Continue clonidine patch.  Outpatient follow-up  Hyperlipidemia -Continue home statins  Anemia of chronic disease -Hemoglobin stable.  Outpatient follow-up  History of PVD status post left BKA -Outpatient follow-up    Discharge Instructions  Discharge Instructions    Call MD for:  difficulty breathing, headache or visual disturbances   Complete by:  As directed    Call MD for:  extreme fatigue   Complete by:  As directed    Call MD for:  hives   Complete by:  As directed    Call MD for:  persistant dizziness or light-headedness   Complete by:  As directed    Call MD for:  persistant nausea and vomiting   Complete by:  As directed    Call MD for:  severe uncontrolled pain   Complete by:  As directed    Call MD for:  temperature >100.4   Complete by:  As directed    Diet - low sodium heart healthy   Complete by:  As directed    Diet Carb Modified   Complete by:  As directed    Discharge instructions   Complete by:  As directed    Renal hemodialysis diet with fluid restriction up to 1200 cc a day   Increase activity slowly   Complete by:  As directed      Allergies as of 07/11/2017   No Known Allergies     Medication List    TAKE these medications   aspirin EC 81 MG tablet Take 1 tablet (81 mg total) at bedtime by mouth.  atorvastatin 80 MG tablet Commonly known as:  LIPITOR Take 40 mg daily by mouth.   cloNIDine 0.2 mg/24hr patch Commonly known as:  CATAPRES - Dosed in mg/24 hr Place 0.2 mg onto the skin once a week.   insulin glargine 100 UNIT/ML injection Commonly known as:  LANTUS Inject 20 Units into the skin every other day. In the morning   sevelamer carbonate 800 MG  tablet Commonly known as:  RENVELA Take 800-1,600 mg by mouth See admin instructions. Take 2 tablets (1600 mg) by mouth with meals and 1 tablet (800 mg) with snacks   sitaGLIPtin 100 MG tablet Commonly known as:  JANUVIA Take 100 mg by mouth at bedtime.   timolol 0.5 % ophthalmic solution Commonly known as:  TIMOPTIC Place 1 drop into both eyes 2 (two) times daily.      Follow-up Information    Rosita Fire, MD. Schedule an appointment as soon as possible for a visit in 1 week(s).   Specialty:  Internal Medicine Contact information: [address] [id number] [phone]        Outpatient dialysis Follow up.   Why:  Keep scheduled appointment         No Known Allergies  Consultations:  Nephrology and IR   Procedures/Studies: Dg Chest 2 View  Result Date: 06/29/2017 CLINICAL DATA:  Shortness of breath and left-sided chest pain. EXAM: CHEST - 2 VIEW COMPARISON:  Chest x-ray dated May 14, 2012. FINDINGS: The heart size and mediastinal contours are within normal limits. Calcified mediastinal and supraclavicular lymph nodes are again noted. Vascular congestion with diffusely increased interstitial markings, greater on the right. Hazy opacification in the right lower lobe. Small right pleural effusion. No acute osseous abnormality. IMPRESSION: 1. Vascular congestion with asymmetric right greater than left interstitial edema. Hazy opacification in the right lower lobe could reflect asymmetric alveolar edema versus infiltrate. 2. Small right pleural effusion. Electronically Signed   By: Titus Dubin M.D.   On: 06/29/2017 09:07   Ir Fluoro Guide Cv Line Right  Result Date: 07/10/2017 CLINICAL DATA:  End-stage renal disease and clotted right arm dialysis graft despite recent outpatient declot procedure with stent placement 1 week ago. He now presents for tunneled hemodialysis catheter placement. EXAM: NON-TUNNELED CENTRAL VENOUS CATHETER PLACEMENT WITH  ULTRASOUND AND FLUOROSCOPIC GUIDANCE FLUOROSCOPY TIME:  18 seconds.  0.9 mGy. PROCEDURE: The procedure, risks, benefits, and alternatives were explained to the patient. Questions regarding the procedure were encouraged and answered. The patient understands and consents to the procedure. A time-out was performed prior to initiating the procedure. The right neck and chest were prepped with chlorhexidine in a sterile fashion, and a sterile drape was applied covering the operative field. Maximum barrier sterile technique with sterile gowns and gloves were used for the procedure. Local anesthesia was provided with 1% lidocaine. Ultrasound was used to confirm patency of the right internal jugular vein. After creating a small venotomy incision, a 21 gauge needle was advanced into the right internal jugular vein under direct, real-time ultrasound guidance. Ultrasound image documentation was performed. After securing guidewire access, the venotomy was dilated. A 20 cm triple lumen Mahurkar non tunneled hemodialysis catheter was then advanced over a guidewire. Final catheter positioning was confirmed and documented with a fluoroscopic spot image. The catheter was aspirated, flushed with saline, and injected with appropriate volume heparin dwells. The catheter exit site was secured with a 0-Prolene retention suture. COMPLICATIONS: None.  No pneumothorax. FINDINGS: When the patient was placed in  a supine position for catheter placement, he became acutely hypoxic with oxygen saturations in the high 70s to low 80s with agitation and respiratory distress. Oxygenation did improve after placing a non-rebreather mask. He could not be safely sedated for tunneled catheter placement and decision was made to place a temporary catheter today for immediate hemodialysis needs. After placement, the catheter tip lies in the right atrium. IMPRESSION: Placement of non-tunneled central venous catheter via the right internal jugular vein. The  catheter tip lies in the right atrium. The catheter is ready for immediate use. A tunneled hemodialysis catheter could not be placed today due to significant hypoxia in the supine position with acute respiratory distress and agitation. Electronically Signed   By: Aletta Edouard M.D.   On: 07/10/2017 16:43   Ir US Guide Vasc Access Right  Result Date: 07/10/2017 CLINICAL DATA:  End-stage renal disease and clotted right arm dialysis graft despite recent outpatient declot procedure with stent placement 1 week ago. He now presents for tunneled hemodialysis catheter placement. EXAM: NON-TUNNELED CENTRAL VENOUS CATHETER PLACEMENT WITH ULTRASOUND AND FLUOROSCOPIC GUIDANCE FLUOROSCOPY TIME:  18 seconds.  0.9 mGy. PROCEDURE: The procedure, risks, benefits, and alternatives were explained to the patient. Questions regarding the procedure were encouraged and answered. The patient understands and consents to the procedure. A time-out was performed prior to initiating the procedure. The right neck and chest were prepped with chlorhexidine in a sterile fashion, and a sterile drape was applied covering the operative field. Maximum barrier sterile technique with sterile gowns and gloves were used for the procedure. Local anesthesia was provided with 1% lidocaine. Ultrasound was used to confirm patency of the right internal jugular vein. After creating a small venotomy incision, a 21 gauge needle was advanced into the right internal jugular vein under direct, real-time ultrasound guidance. Ultrasound image documentation was performed. After securing guidewire access, the venotomy was dilated. A 20 cm triple lumen Mahurkar non tunneled hemodialysis catheter was then advanced over a guidewire. Final catheter positioning was confirmed and documented with a fluoroscopic spot image. The catheter was aspirated, flushed with saline, and injected with appropriate volume heparin dwells. The catheter exit site was secured with a  0-Prolene retention suture. COMPLICATIONS: None.  No pneumothorax. FINDINGS: When the patient was placed in a supine position for catheter placement, he became acutely hypoxic with oxygen saturations in the high 70s to low 80s with agitation and respiratory distress. Oxygenation did improve after placing a non-rebreather mask. He could not be safely sedated for tunneled catheter placement and decision was made to place a temporary catheter today for immediate hemodialysis needs. After placement, the catheter tip lies in the right atrium. IMPRESSION: Placement of non-tunneled central venous catheter via the right internal jugular vein. The catheter tip lies in the right atrium. The catheter is ready for immediate use. A tunneled hemodialysis catheter could not be placed today due to significant hypoxia in the supine position with acute respiratory distress and agitation. Electronically Signed   By: Aletta Edouard M.D.   On: 07/10/2017 16:43   Portable Chest 1 View  Result Date: 07/11/2017 CLINICAL DATA:  Fluid overload EXAM: PORTABLE CHEST 1 VIEW COMPARISON:  Chest x-rays dated 06/29/2017 and 05/14/2012. FINDINGS: Heart size and mediastinal contours are stable. Calcified mediastinal and supraclavicular lymph nodes again noted. Dialysis catheter has been placed with tip well-positioned at the level the right atrium. No pneumothorax seen. Persistent central pulmonary vascular congestion and bilateral interstitial edema, stable. More confluent opacity at the right lung  base, compatible with overt alveolar pulmonary edema. Probable small right bilateral pleural effusions. No acute or suspicious osseous finding. IMPRESSION: 1. Continued interstitial and alveolar pulmonary edema, right greater than left, not significantly changed compared to the most recent chest x-ray of 06/29/2017, compatible with given history of volume overload. 2. New right sided dialysis catheter in place with tip well-positioned at the level of  the right atrium. No pneumothorax. Electronically Signed   By: Franki Cabot M.D.   On: 07/11/2017 08:42    IR guided tunneled hemodialysis catheter placement on 07/11/2017   Subjective: Patient seen and examined at bedside.  He feels much better and wants to go home after dialysis.  No current shortness of breath or chest pain.  Discharge Exam: Vitals:   07/11/17 0940 07/11/17 0950  BP: (!) 152/70 (!) 154/74  Pulse: 88 90  Resp: 18 19  Temp:    SpO2: 98% 98%   Vitals:   07/11/17 0920 07/11/17 0935 07/11/17 0940 07/11/17 0950  BP: (!) 150/71 (!) 155/72 (!) 152/70 (!) 154/74  Pulse: 88 91 88 90  Resp: (!) 23 19 18 19   Temp:      TempSrc:      SpO2: 96% 96% 98% 98%  Weight:      Height:        General: Pt is  awake, not in acute distress Cardiovascular: Rate controlled, S1/S2 + Respiratory: Bilateral decreased breath sounds at bases Abdominal: Soft, NT, ND, bowel sounds + Extremities: no edema, no cyanosis    The results of significant diagnostics from this hospitalization (including imaging, microbiology, ancillary and laboratory) are listed below for reference.     Microbiology: No results found for this or any previous visit (from the past 240 hour(s)).   Labs: BNP (last 3 results) Recent Labs    06/29/17 0804 07/10/17 1850  BNP 773.0* 0,960.4*   Basic Metabolic Panel: Recent Labs  Lab 07/10/17 0650 07/10/17 1004 07/10/17 2135 07/11/17 0630  NA  --  144 138 139  K  --  4.0 4.0 4.0  CL  --   --  100* 100*  CO2  --   --  23 26  GLUCOSE  --  207* 120* 89  BUN  --   --  21* 28*  CREATININE 14.46*  --  7.91* 9.05*  CALCIUM  --   --  9.0 8.6*  PHOS  --   --  3.2  --    Liver Function Tests: Recent Labs  Lab 07/10/17 2135  ALBUMIN 3.4*   No results for input(s): LIPASE, AMYLASE in the last 168 hours. No results for input(s): AMMONIA in the last 168 hours. CBC: Recent Labs  Lab 07/10/17 0650 07/10/17 1004 07/11/17 0630  WBC 5.7  --  5.7  HGB  9.1* 11.2* 8.6*  HCT 27.3* 33.0* 25.2*  MCV 92.2  --  91.3  PLT 261  --  181   Cardiac Enzymes: Recent Labs  Lab 07/10/17 1850  TROPONINI 0.61*   BNP: Invalid input(s): POCBNP CBG: Recent Labs  Lab 07/10/17 0625 07/10/17 2054 07/11/17 0731  GLUCAP 137* 117* 91   D-Dimer No results for input(s): DDIMER in the last 72 hours. Hgb A1c Recent Labs    07/10/17 2123  HGBA1C 5.1   Lipid Profile No results for input(s): CHOL, HDL, LDLCALC, TRIG, CHOLHDL, LDLDIRECT in the last 72 hours. Thyroid function studies No results for input(s): TSH, T4TOTAL, T3FREE, THYROIDAB in the last 72 hours.  Invalid input(s):  FREET3 Anemia work up No results for input(s): VITAMINB12, FOLATE, FERRITIN, TIBC, IRON, RETICCTPCT in the last 72 hours. Urinalysis No results found for: COLORURINE, APPEARANCEUR, LABSPEC, [place], GLUCOSEU, HGBUR, BILIRUBINUR, KETONESUR, PROTEINUR, UROBILINOGEN, NITRITE, LEUKOCYTESUR Sepsis Labs Invalid input(s): PROCALCITONIN,  WBC,  LACTICIDVEN Microbiology No results found for this or any previous visit (from the past 240 hour(s)).   Time coordinating discharge: 35 minutes  SIGNED:   Aline August, MD  [facility] 07/11/2017, 11:50 AM Pager: [phone]  If 7PM-7AM, please contact night-coverage [email or web address] Password [email or web address]

## 2017-08-02 ENCOUNTER — Other Ambulatory Visit: Payer: Self-pay

## 2017-08-02 DIAGNOSIS — T82511A Breakdown (mechanical) of surgically created arteriovenous shunt, initial encounter: Secondary | ICD-10-CM

## 2017-08-16 ENCOUNTER — Other Ambulatory Visit: Payer: Self-pay | Admitting: *Deleted

## 2017-08-16 ENCOUNTER — Encounter: Payer: Self-pay | Admitting: *Deleted

## 2017-08-16 ENCOUNTER — Ambulatory Visit (HOSPITAL_COMMUNITY)
Admission: RE | Admit: 2017-08-16 | Discharge: 2017-08-16 | Disposition: A | Discharge status: Home / Self Care | Payer: Medicare Other | Source: Ambulatory Visit | Attending: Vascular Surgery | Admitting: Vascular Surgery

## 2017-08-16 ENCOUNTER — Ambulatory Visit (INDEPENDENT_AMBULATORY_CARE_PROVIDER_SITE_OTHER): Payer: Medicare Other | Admitting: Vascular Surgery

## 2017-08-16 ENCOUNTER — Ambulatory Visit (INDEPENDENT_AMBULATORY_CARE_PROVIDER_SITE_OTHER)
Admission: RE | Admit: 2017-08-16 | Discharge: 2017-08-16 | Disposition: A | Discharge status: Home / Self Care | Payer: Medicare Other | Source: Ambulatory Visit | Attending: Vascular Surgery | Admitting: Vascular Surgery

## 2017-08-16 ENCOUNTER — Encounter: Payer: Self-pay | Admitting: Vascular Surgery

## 2017-08-16 VITALS — BP 182/84 | HR 66 | Temp 97.0°F | Resp 16 | Ht 73.0 in | Wt 158.0 lb

## 2017-08-16 DIAGNOSIS — E119 Type 2 diabetes mellitus without complications: Secondary | ICD-10-CM | POA: Diagnosis not present

## 2017-08-16 DIAGNOSIS — Y838 Other surgical procedures as the cause of abnormal reaction of the patient, or of later complication, without mention of misadventure at the time of the procedure: Secondary | ICD-10-CM | POA: Insufficient documentation

## 2017-08-16 DIAGNOSIS — T82511A Breakdown (mechanical) of surgically created arteriovenous shunt, initial encounter: Secondary | ICD-10-CM

## 2017-08-16 DIAGNOSIS — N186 End stage renal disease: Secondary | ICD-10-CM | POA: Diagnosis not present

## 2017-08-16 DIAGNOSIS — Z992 Dependence on renal dialysis: Secondary | ICD-10-CM

## 2017-08-16 DIAGNOSIS — Z87891 Personal history of nicotine dependence: Secondary | ICD-10-CM | POA: Diagnosis not present

## 2017-08-16 DIAGNOSIS — I10 Essential (primary) hypertension: Secondary | ICD-10-CM | POA: Diagnosis not present

## 2017-08-16 NOTE — Progress Notes (Signed)
Patient is a 67 year old male who presents today for evaluation for permanent hemodialysis access.  The patient previously had a left brachiocephalic AV fistula which has failed.  He also has a right forearm AV graft which recently has failed.  He was actually seen by my partner Dr. Scot Dock August 2017.  His forearm graft was failing at that point.  He was set up for a central venogram but for some reason the study never was done.  Dr. 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  His current dialysis schedule is Monday Wednesday Friday.  He is using a right-sided catheter.  He is right-handed.  However, he would prefer to have an access placed back in his right arm again.  He denies any numbness or tingling in his right hand.  Review of systems: He denies shortness of breath.  He denies chest pain.  Current Outpatient Medications on File Prior to Visit  Medication Sig Dispense Refill  . aspirin EC 81 MG tablet Take 1 tablet (81 mg total) at bedtime by mouth.    Marland Kitchen atorvastatin (LIPITOR) 80 MG tablet Take 40 mg daily by mouth.    . cloNIDine (CATAPRES - DOSED IN MG/24 HR) 0.2 mg/24hr patch Place 0.2 mg onto the skin once a week.     . sevelamer carbonate (RENVELA) 800 MG tablet Take 800-1,600 mg by mouth See admin instructions. Take 2 tablets (1600 mg) by mouth with meals and 1 tablet (800 mg) with snacks    . sitaGLIPtin (JANUVIA) 100 MG tablet Take 100 mg by mouth at bedtime.     . timolol (TIMOPTIC) 0.5 % ophthalmic solution Place 1 drop into both eyes 2 (two) times daily.    . insulin glargine (LANTUS) 100 UNIT/ML injection Inject 20 Units into the skin every other day. In the morning     No current facility-administered medications on file prior to visit.     Physical exam:  Vitals:   08/16/17 1121  BP: (!) 182/84  Pulse: 66  Resp: 16  Temp: (!) 97 F (36.1 C)  TempSrc: Oral  SpO2: 100%  Weight: 158 lb (71.7 kg)  Height: 6\' 1"  (1.854  m)    Extremities: 2+ brachial radial pulse bilaterally, thrombosed left upper arm AV fistula, thrombosed right forearm AV graft, a few scattered chest wall collaterals right side, right side dialysis catheter without tenderness erythema or drainage  Data: Patient had a duplex ultrasound of his right cephalic vein today which states that the cephalic vein is 2-4/2 to 3 mm in diameter.  This is in contrast to Dr. Nicole Cella exploration in the operating room which suggested the vein was small and sclerotic.  Patient also did have a left vein mapping which shows a very small left basilic vein and right basilic vein.  Assessment: Patient needs long-term hemodialysis access.  Plan: Patient will be scheduled for right central venogram to make sure that his central veins are patent before placing a new right upper arm AV graft versus AV fistula.  If the patient's right central venous system is occluded we would consider an AV graft in the left arm.  The patient's right central venogram is scheduled for Tuesday, Aug 21, 2017 by my partner Dr. Trula Slade to avoid the patient's dialysis today.  He is scheduled to have his new access placed by me on May 14.  Ruta Hinds, MD [facility] Office: [phone] Pager: [phone]

## 2017-08-16 NOTE — H&P (View-Only) (Signed)
Patient is a 67 year old male who presents today for evaluation for permanent hemodialysis access.  The patient previously had a left brachiocephalic AV fistula which has failed.  He also has a right forearm AV graft which recently has failed.  He was actually seen by my partner Dr. Scot Dock August 2017.  His forearm graft was failing at that point.  He was set up for a central venogram but for some reason the study never was done.  Dr. 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  His current dialysis schedule is Monday Wednesday Friday.  He is using a right-sided catheter.  He is right-handed.  However, he would prefer to have an access placed back in his right arm again.  He denies any numbness or tingling in his right hand.  Review of systems: He denies shortness of breath.  He denies chest pain.  Current Outpatient Medications on File Prior to Visit  Medication Sig Dispense Refill  . aspirin EC 81 MG tablet Take 1 tablet (81 mg total) at bedtime by mouth.    Marland Kitchen atorvastatin (LIPITOR) 80 MG tablet Take 40 mg daily by mouth.    . cloNIDine (CATAPRES - DOSED IN MG/24 HR) 0.2 mg/24hr patch Place 0.2 mg onto the skin once a week.     . sevelamer carbonate (RENVELA) 800 MG tablet Take 800-1,600 mg by mouth See admin instructions. Take 2 tablets (1600 mg) by mouth with meals and 1 tablet (800 mg) with snacks    . sitaGLIPtin (JANUVIA) 100 MG tablet Take 100 mg by mouth at bedtime.     . timolol (TIMOPTIC) 0.5 % ophthalmic solution Place 1 drop into both eyes 2 (two) times daily.    . insulin glargine (LANTUS) 100 UNIT/ML injection Inject 20 Units into the skin every other day. In the morning     No current facility-administered medications on file prior to visit.     Physical exam:  Vitals:   08/16/17 1121  BP: (!) 182/84  Pulse: 66  Resp: 16  Temp: (!) 97 F (36.1 C)  TempSrc: Oral  SpO2: 100%  Weight: 158 lb (71.7 kg)  Height: 6\' 1"  (1.854  m)    Extremities: 2+ brachial radial pulse bilaterally, thrombosed left upper arm AV fistula, thrombosed right forearm AV graft, a few scattered chest wall collaterals right side, right side dialysis catheter without tenderness erythema or drainage  Data: Patient had a duplex ultrasound of his right cephalic vein today which states that the cephalic vein is 2-4/4 to 3 mm in diameter.  This is in contrast to Dr. Nicole Cella exploration in the operating room which suggested the vein was small and sclerotic.  Patient also did have a left vein mapping which shows a very small left basilic vein and right basilic vein.  Assessment: Patient needs long-term hemodialysis access.  Plan: Patient will be scheduled for right central venogram to make sure that his central veins are patent before placing a new right upper arm AV graft versus AV fistula.  If the patient's right central venous system is occluded we would consider an AV graft in the left arm.  The patient's right central venogram is scheduled for Tuesday, Aug 21, 2017 by my partner Dr. Trula Slade to avoid the patient's dialysis today.  He is scheduled to have his new access placed by me on May 14.  Ruta Hinds, MD [facility] Office: [phone] Pager: [phone]

## 2017-08-16 NOTE — H&P (View-Only) (Signed)
Patient is a 67 year old male who presents today for evaluation for permanent hemodialysis access.  The patient previously had a left brachiocephalic AV fistula which has failed.  He also has a right forearm AV graft which recently has failed.  He was actually seen by my partner Dr. Scot Dock August 2017.  His forearm graft was failing at that point.  He was set up for a central venogram but for some reason the study never was done.  Dr. 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  His current dialysis schedule is Monday Wednesday Friday.  He is using a right-sided catheter.  He is right-handed.  However, he would prefer to have an access placed back in his right arm again.  He denies any numbness or tingling in his right hand.  Review of systems: He denies shortness of breath.  He denies chest pain.  Current Outpatient Medications on File Prior to Visit  Medication Sig Dispense Refill  . aspirin EC 81 MG tablet Take 1 tablet (81 mg total) at bedtime by mouth.    Marland Kitchen atorvastatin (LIPITOR) 80 MG tablet Take 40 mg daily by mouth.    . cloNIDine (CATAPRES - DOSED IN MG/24 HR) 0.2 mg/24hr patch Place 0.2 mg onto the skin once a week.     . sevelamer carbonate (RENVELA) 800 MG tablet Take 800-1,600 mg by mouth See admin instructions. Take 2 tablets (1600 mg) by mouth with meals and 1 tablet (800 mg) with snacks    . sitaGLIPtin (JANUVIA) 100 MG tablet Take 100 mg by mouth at bedtime.     . timolol (TIMOPTIC) 0.5 % ophthalmic solution Place 1 drop into both eyes 2 (two) times daily.    . insulin glargine (LANTUS) 100 UNIT/ML injection Inject 20 Units into the skin every other day. In the morning     No current facility-administered medications on file prior to visit.     Physical exam:  Vitals:   08/16/17 1121  BP: (!) 182/84  Pulse: 66  Resp: 16  Temp: (!) 97 F (36.1 C)  TempSrc: Oral  SpO2: 100%  Weight: 158 lb (71.7 kg)  Height: 6\' 1"  (1.854  m)    Extremities: 2+ brachial radial pulse bilaterally, thrombosed left upper arm AV fistula, thrombosed right forearm AV graft, a few scattered chest wall collaterals right side, right side dialysis catheter without tenderness erythema or drainage  Data: Patient had a duplex ultrasound of his right cephalic vein today which states that the cephalic vein is 3-2/4 to 3 mm in diameter.  This is in contrast to Dr. Nicole Cella exploration in the operating room which suggested the vein was small and sclerotic.  Patient also did have a left vein mapping which shows a very small left basilic vein and right basilic vein.  Assessment: Patient needs long-term hemodialysis access.  Plan: Patient will be scheduled for right central venogram to make sure that his central veins are patent before placing a new right upper arm AV graft versus AV fistula.  If the patient's right central venous system is occluded we would consider an AV graft in the left arm.  The patient's right central venogram is scheduled for Tuesday, Aug 21, 2017 by my partner Dr. Trula Slade to avoid the patient's dialysis today.  He is scheduled to have his new access placed by me on May 14.  Ruta Hinds, MD [facility] Office: [phone] Pager: [phone]

## 2017-08-21 ENCOUNTER — Encounter (HOSPITAL_COMMUNITY)
Admission: RE | Disposition: A | Discharge status: Home / Self Care | Payer: Self-pay | Source: Ambulatory Visit | Attending: Surgery

## 2017-08-21 ENCOUNTER — Encounter (HOSPITAL_COMMUNITY): Payer: Self-pay | Admitting: Surgery

## 2017-08-21 ENCOUNTER — Ambulatory Visit (HOSPITAL_COMMUNITY)
Admission: RE | Admit: 2017-08-21 | Discharge: 2017-08-21 | Disposition: A | Discharge status: Home / Self Care | Payer: Medicare Other | Source: Ambulatory Visit | Attending: Surgery | Admitting: Surgery

## 2017-08-21 DIAGNOSIS — Z794 Long term (current) use of insulin: Secondary | ICD-10-CM | POA: Insufficient documentation

## 2017-08-21 DIAGNOSIS — Z79899 Other long term (current) drug therapy: Secondary | ICD-10-CM | POA: Insufficient documentation

## 2017-08-21 DIAGNOSIS — N186 End stage renal disease: Secondary | ICD-10-CM | POA: Diagnosis present

## 2017-08-21 DIAGNOSIS — N185 Chronic kidney disease, stage 5: Secondary | ICD-10-CM | POA: Diagnosis not present

## 2017-08-21 DIAGNOSIS — T82868A Thrombosis of vascular prosthetic devices, implants and grafts, initial encounter: Secondary | ICD-10-CM | POA: Insufficient documentation

## 2017-08-21 DIAGNOSIS — Z7982 Long term (current) use of aspirin: Secondary | ICD-10-CM | POA: Diagnosis not present

## 2017-08-21 DIAGNOSIS — Y832 Surgical operation with anastomosis, bypass or graft as the cause of abnormal reaction of the patient, or of later complication, without mention of misadventure at the time of the procedure: Secondary | ICD-10-CM | POA: Diagnosis not present

## 2017-08-21 HISTORY — PX: UPPER EXTREMITY VENOGRAPHY: CATH118272

## 2017-08-21 LAB — POCT I-STAT, CHEM 8
BUN: 24 mg/dL — ABNORMAL HIGH (ref 6–20)
CREATININE: 8.6 mg/dL — AB (ref 0.61–1.24)
Calcium, Ion: 1.06 mmol/L — ABNORMAL LOW (ref 1.15–1.40)
Chloride: 102 mmol/L (ref 101–111)
Glucose, Bld: 88 mg/dL (ref 65–99)
HEMATOCRIT: 34 % — AB (ref 39.0–52.0)
HEMOGLOBIN: 11.6 g/dL — AB (ref 13.0–17.0)
POTASSIUM: 4 mmol/L (ref 3.5–5.1)
Sodium: 141 mmol/L (ref 135–145)
TCO2: 28 mmol/L (ref 22–32)

## 2017-08-21 LAB — GLUCOSE, CAPILLARY: Glucose-Capillary: 86 mg/dL (ref 65–99)

## 2017-08-21 SURGERY — UPPER EXTREMITY VENOGRAPHY
Anesthesia: LOCAL | Laterality: Right

## 2017-08-21 MED ORDER — IODIXANOL 320 MG/ML IV SOLN
INTRAVENOUS | Status: DC | PRN
  Administered 2017-08-21: 20 mL via INTRAVENOUS

## 2017-08-21 MED ORDER — HEPARIN (PORCINE) IN NACL 2-0.9 UNITS/ML
INTRAMUSCULAR | Status: AC | PRN
  Administered 2017-08-21: 500 mL

## 2017-08-21 MED ORDER — SODIUM CHLORIDE 0.9% FLUSH: 3.0000 mL | Freq: Two times a day (BID) | INTRAVENOUS | Status: DC

## 2017-08-21 MED ORDER — SODIUM CHLORIDE 0.9 % IV SOLN
INTRAVENOUS | Status: AC | PRN
  Administered 2017-08-21: 20 mL via INTRAVENOUS

## 2017-08-21 MED ORDER — SODIUM CHLORIDE 0.9% FLUSH: 3.0000 mL | INTRAVENOUS | Status: DC | PRN

## 2017-08-21 MED ORDER — SODIUM CHLORIDE 0.9 % IV SOLN: 250.0000 mL | INTRAVENOUS | Status: DC | PRN

## 2017-08-21 SURGICAL SUPPLY — 2 items
STOPCOCK MORSE 400PSI 3WAY (MISCELLANEOUS) ×1 IMPLANT
TUBING CIL FLEX 10 FLL-RA (TUBING) ×1 IMPLANT

## 2017-08-21 NOTE — Discharge Instructions (Signed)
**Note Emmersen Garraway-identified via Obfuscation** Venogram, Care After °This sheet gives you information about how to care for yourself after your procedure. Your health care provider may also give you more specific instructions. If you have problems or questions, contact your health care provider. °What can I expect after the procedure? °After the procedure, it is common to have: °· Bruising or mild discomfort in the area where the IV was inserted (insertion site). ° °Follow these instructions at home: °Eating and drinking °· Follow instructions from your health care provider about eating or drinking restrictions. °· Drink a lot of fluids for the first several days after the procedure, as directed by your health care provider. This helps to wash (flush) the contrast out of your body. Examples of healthy fluids include water or low-calorie drinks. °General instructions °· Check your IV insertion area every day for signs of infection. Check for: °? Redness, swelling, or pain. °? Fluid or blood. °? Warmth. °? Pus or a bad smell. °· Take over-the-counter and prescription medicines only as told by your health care provider. °· Rest and return to your normal activities as told by your health care provider. Ask your health care provider what activities are safe for you. °· Do not drive for 24 hours if you were given a medicine to help you relax (sedative), or until your health care provider approves. °· Keep all follow-up visits as told by your health care provider. This is important. °Contact a health care provider if: °· Your skin becomes itchy or you develop a rash or hives. °· You have a fever that does not get better with medicine. °· You feel nauseous. °· You vomit. °· You have redness, swelling, or pain around the insertion site. °· You have fluid or blood coming from the insertion site. °· Your insertion area feels warm to the touch. °· You have pus or a bad smell coming from the insertion site. °Get help right away if: °· You have difficulty breathing or  shortness of breath. °· You develop chest pain. °· You faint. °· You feel very dizzy. °These symptoms may represent a serious problem that is an emergency. Do not wait to see if the symptoms will go away. Get medical help right away. Call your local emergency services (911 in the [place] Do not drive yourself to the hospital. °Summary °· After your procedure, it is common to have bruising or mild discomfort in the area where the IV was inserted. °· You should check your IV insertion area every day for signs of infection. °· Take over-the-counter and prescription medicines only as told by your health care provider. °· You should drink a lot of fluids for the first several days after the procedure to help flush the contrast from your body. °This information is not intended to replace advice given to you by your health care provider. Make sure you discuss any questions you have with your health care provider. °Document Released: 01/22/2013 Document Revised: 02/26/2016 Document Reviewed: 02/26/2016 °Elsevier Interactive Patient Education © 2017 [employer] ° °

## 2017-08-21 NOTE — Interval H&P Note (Signed)
History and Physical Interval Note:  08/21/2017 11:40 AM  Wayne [name]  has presented today for surgery, with the diagnosis of Rt Arm Swelling  The various methods of treatment have been discussed with the patient and family. After consideration of risks, benefits and other options for treatment, the patient has consented to  Procedure(s): UPPER EXTREMITY VENOGRAPHY (Right) as a surgical intervention .  The patient's history has been reviewed, patient examined, no change in status, stable for surgery.  I have reviewed the patient's chart and labs.  Questions were answered to the patient's satisfaction.     [name]

## 2017-08-21 NOTE — Op Note (Signed)
    Patient name: Wayne [name] MRN: [id number] DOB: 14-Sep-1950 Sex: male  08/21/2017 Pre-operative Diagnosis: ESRD Post-operative diagnosis:  Same Surgeon:  [name] Procedure Performed:  1.  Right arm and central venogram    Indications:  Access planning  Procedure:  The patient was identified in the holding area and taken to room 8.  The patient was then placed supine on the table and prepped and draped in the usual sterile fashion.  A time out was called.  Contrast injections were performed through a peripheral IV  Findings:  Central venous system is patent.  There is narrowing of the right Innominant vein likely secondary to the [facility].  The axillary vein is patent proximally, however it occludes near a stent in the axilla    Impression:  #1  Patent central venous system  #2  Suspect patient will be a candidate for right arm AVGG, however, a stent is visualized but occluded in the distal axillary vein   V. [name], M.D. [facility] [place] Office: [phone] Pager:  [phone]

## 2017-08-27 ENCOUNTER — Encounter (HOSPITAL_COMMUNITY): Payer: Self-pay | Admitting: *Deleted

## 2017-08-27 ENCOUNTER — Other Ambulatory Visit: Payer: Self-pay

## 2017-08-27 NOTE — Progress Notes (Signed)
Spoke with pt for pre-op call. Pt denies cardiac history or chest pain. Pt is a type 2 diabetic. Has not had to take his Lantus Insulin since January. Last A1C was 5.1 on 07/10/17. Pt states his fasting blood sugar usually is between 97-101. States Saturday AM it was "low". States that's not normal for him. Instructed pt not to take is Januvia this PM. Instructed pt to check his blood sugar in the AM when he gets up and every 2 hours until he leaves for the hospital. If blood sugar is 70 or below, treat with 1/2 cup of clear juice (apple or cranberry) and recheck blood sugar 15 minutes after drinking juice. If blood sugar continues to be 70 or below, call the Short Stay department and ask to speak to a nurse. Pt voiced understanding.

## 2017-08-28 ENCOUNTER — Ambulatory Visit (HOSPITAL_COMMUNITY): Payer: Medicare Other | Admitting: Anesthesiology

## 2017-08-28 ENCOUNTER — Ambulatory Visit (HOSPITAL_COMMUNITY)
Admission: RE | Admit: 2017-08-28 | Discharge: 2017-08-28 | Disposition: A | Discharge status: Home / Self Care | Payer: Medicare Other | Source: Ambulatory Visit | Attending: Vascular Surgery | Admitting: Vascular Surgery

## 2017-08-28 ENCOUNTER — Encounter (HOSPITAL_COMMUNITY): Payer: Self-pay | Admitting: *Deleted

## 2017-08-28 ENCOUNTER — Encounter (HOSPITAL_COMMUNITY)
Admission: RE | Disposition: A | Discharge status: Home / Self Care | Payer: Self-pay | Source: Ambulatory Visit | Attending: Vascular Surgery

## 2017-08-28 DIAGNOSIS — I120 Hypertensive chronic kidney disease with stage 5 chronic kidney disease or end stage renal disease: Secondary | ICD-10-CM | POA: Insufficient documentation

## 2017-08-28 DIAGNOSIS — T82868A Thrombosis of vascular prosthetic devices, implants and grafts, initial encounter: Secondary | ICD-10-CM | POA: Insufficient documentation

## 2017-08-28 DIAGNOSIS — E1122 Type 2 diabetes mellitus with diabetic chronic kidney disease: Secondary | ICD-10-CM | POA: Insufficient documentation

## 2017-08-28 DIAGNOSIS — Z7982 Long term (current) use of aspirin: Secondary | ICD-10-CM | POA: Diagnosis not present

## 2017-08-28 DIAGNOSIS — Z992 Dependence on renal dialysis: Secondary | ICD-10-CM | POA: Insufficient documentation

## 2017-08-28 DIAGNOSIS — Z7984 Long term (current) use of oral hypoglycemic drugs: Secondary | ICD-10-CM | POA: Insufficient documentation

## 2017-08-28 DIAGNOSIS — N186 End stage renal disease: Secondary | ICD-10-CM | POA: Insufficient documentation

## 2017-08-28 DIAGNOSIS — Z794 Long term (current) use of insulin: Secondary | ICD-10-CM | POA: Insufficient documentation

## 2017-08-28 DIAGNOSIS — Z87891 Personal history of nicotine dependence: Secondary | ICD-10-CM | POA: Insufficient documentation

## 2017-08-28 DIAGNOSIS — E1151 Type 2 diabetes mellitus with diabetic peripheral angiopathy without gangrene: Secondary | ICD-10-CM | POA: Insufficient documentation

## 2017-08-28 DIAGNOSIS — I82A11 Acute embolism and thrombosis of right axillary vein: Secondary | ICD-10-CM | POA: Diagnosis not present

## 2017-08-28 DIAGNOSIS — Z79899 Other long term (current) drug therapy: Secondary | ICD-10-CM | POA: Diagnosis not present

## 2017-08-28 DIAGNOSIS — N185 Chronic kidney disease, stage 5: Secondary | ICD-10-CM | POA: Diagnosis not present

## 2017-08-28 DIAGNOSIS — Y828 Other medical devices associated with adverse incidents: Secondary | ICD-10-CM | POA: Insufficient documentation

## 2017-08-28 HISTORY — DX: Personal history of urinary calculi: Z87.442

## 2017-08-28 HISTORY — PX: FEMORAL ARTERY EXPLORATION: SHX5160

## 2017-08-28 LAB — GLUCOSE, CAPILLARY
GLUCOSE-CAPILLARY: 93 mg/dL (ref 65–99)
Glucose-Capillary: 82 mg/dL (ref 65–99)
Glucose-Capillary: 105 mg/dL — ABNORMAL HIGH (ref 65–99)

## 2017-08-28 LAB — POCT I-STAT 4, (NA,K, GLUC, HGB,HCT)
GLUCOSE: 84 mg/dL (ref 65–99)
HCT: 37 % — ABNORMAL LOW (ref 39.0–52.0)
Hemoglobin: 12.6 g/dL — ABNORMAL LOW (ref 13.0–17.0)
Potassium: 4 mmol/L (ref 3.5–5.1)
Sodium: 140 mmol/L (ref 135–145)

## 2017-08-28 SURGERY — EXPLORATION, ARTERY, FEMORAL
Anesthesia: Monitor Anesthesia Care | Site: Axilla | Laterality: Right

## 2017-08-28 MED ORDER — FENTANYL CITRATE (PF) 100 MCG/2ML IJ SOLN
INTRAMUSCULAR | Status: DC | PRN
  Administered 2017-08-28: 25 ug via INTRAVENOUS
  Administered 2017-08-28 (×2): 50 ug via INTRAVENOUS

## 2017-08-28 MED ORDER — LIDOCAINE HCL (CARDIAC) PF 100 MG/5ML IV SOSY
PREFILLED_SYRINGE | INTRAVENOUS | Status: DC | PRN
  Administered 2017-08-28: 100 mg via INTRAVENOUS

## 2017-08-28 MED ORDER — LIDOCAINE 2% (20 MG/ML) 5 ML SYRINGE
INTRAMUSCULAR | Status: AC
  Filled 2017-08-28: qty 5

## 2017-08-28 MED ORDER — OXYCODONE-ACETAMINOPHEN 5-325 MG PO TABS: 1.0000 | ORAL_TABLET | Freq: Four times a day (QID) | ORAL | 0 refills | Status: DC | PRN

## 2017-08-28 MED ORDER — DEXAMETHASONE SODIUM PHOSPHATE 10 MG/ML IJ SOLN
INTRAMUSCULAR | Status: DC | PRN
  Administered 2017-08-28: 10 mg via INTRAVENOUS

## 2017-08-28 MED ORDER — DEXAMETHASONE SODIUM PHOSPHATE 10 MG/ML IJ SOLN
INTRAMUSCULAR | Status: AC
  Filled 2017-08-28: qty 1

## 2017-08-28 MED ORDER — SODIUM CHLORIDE 0.9 % IV SOLN
INTRAVENOUS | Status: AC
  Filled 2017-08-28: qty 1.2

## 2017-08-28 MED ORDER — HEPARIN SODIUM (PORCINE) 1000 UNIT/ML IJ SOLN
INTRAMUSCULAR | Status: AC
  Filled 2017-08-28: qty 1

## 2017-08-28 MED ORDER — ONDANSETRON HCL 4 MG/2ML IJ SOLN
INTRAMUSCULAR | Status: AC
  Filled 2017-08-28: qty 2

## 2017-08-28 MED ORDER — PROPOFOL 10 MG/ML IV BOLUS
INTRAVENOUS | Status: DC | PRN
  Administered 2017-08-28: 200 mg via INTRAVENOUS

## 2017-08-28 MED ORDER — SODIUM CHLORIDE 0.9 % IV SOLN
INTRAVENOUS | Status: DC
  Administered 2017-08-28 (×3): via INTRAVENOUS

## 2017-08-28 MED ORDER — HYDRALAZINE HCL 20 MG/ML IJ SOLN
5.0000 mg | INTRAMUSCULAR | Status: AC | PRN
  Administered 2017-08-28 (×4): 5 mg via INTRAVENOUS
  Filled 2017-08-28: qty 1

## 2017-08-28 MED ORDER — ROCURONIUM BROMIDE 50 MG/5ML IV SOLN
INTRAVENOUS | Status: AC
  Filled 2017-08-28: qty 1

## 2017-08-28 MED ORDER — OXYCODONE-ACETAMINOPHEN 5-325 MG PO TABS
ORAL_TABLET | ORAL | Status: AC
  Filled 2017-08-28: qty 1

## 2017-08-28 MED ORDER — PHENYLEPHRINE HCL 10 MG/ML IJ SOLN
INTRAVENOUS | Status: DC | PRN
  Administered 2017-08-28: 40 ug/min via INTRAVENOUS

## 2017-08-28 MED ORDER — CEFAZOLIN SODIUM-DEXTROSE 2-4 GM/100ML-% IV SOLN
2.0000 g | INTRAVENOUS | Status: AC
  Administered 2017-08-28: 2 g via INTRAVENOUS
  Filled 2017-08-28: qty 100

## 2017-08-28 MED ORDER — 0.9 % SODIUM CHLORIDE (POUR BTL) OPTIME
TOPICAL | Status: DC | PRN
  Administered 2017-08-28: 1000 mL

## 2017-08-28 MED ORDER — SODIUM CHLORIDE 0.9 % IV SOLN
INTRAVENOUS | Status: DC | PRN
  Administered 2017-08-28: 11:00:00

## 2017-08-28 MED ORDER — PHENYLEPHRINE 40 MCG/ML (10ML) SYRINGE FOR IV PUSH (FOR BLOOD PRESSURE SUPPORT)
PREFILLED_SYRINGE | INTRAVENOUS | Status: AC
  Filled 2017-08-28: qty 10

## 2017-08-28 MED ORDER — OXYCODONE-ACETAMINOPHEN 5-325 MG PO TABS
1.0000 | ORAL_TABLET | Freq: Once | ORAL | Status: AC
  Administered 2017-08-28: 1 via ORAL

## 2017-08-28 MED ORDER — FENTANYL CITRATE (PF) 250 MCG/5ML IJ SOLN
INTRAMUSCULAR | Status: AC
  Filled 2017-08-28: qty 5

## 2017-08-28 MED ORDER — PROPOFOL 10 MG/ML IV BOLUS
INTRAVENOUS | Status: AC
  Filled 2017-08-28: qty 20

## 2017-08-28 MED ORDER — LIDOCAINE HCL (PF) 1 % IJ SOLN
INTRAMUSCULAR | Status: AC
  Filled 2017-08-28: qty 30

## 2017-08-28 SURGICAL SUPPLY — 31 items
ADH SKN CLS APL DERMABOND .7 (GAUZE/BANDAGES/DRESSINGS) ×2
ARMBAND PINK RESTRICT EXTREMIT (MISCELLANEOUS) ×4 IMPLANT
CANISTER SUCT 3000ML PPV (MISCELLANEOUS) ×3 IMPLANT
CANNULA VESSEL 3MM 2 BLNT TIP (CANNULA) ×3 IMPLANT
CLIP VESOCCLUDE MED 6/CT (CLIP) ×3 IMPLANT
CLIP VESOCCLUDE SM WIDE 6/CT (CLIP) ×3 IMPLANT
COVER PROBE W GEL 5X96 (DRAPES) IMPLANT
DECANTER SPIKE VIAL GLASS SM (MISCELLANEOUS) ×1 IMPLANT
DERMABOND ADVANCED (GAUZE/BANDAGES/DRESSINGS) ×1
DERMABOND ADVANCED .7 DNX12 (GAUZE/BANDAGES/DRESSINGS) ×2 IMPLANT
DRAIN PENROSE 1/4X12 LTX STRL (WOUND CARE) ×1 IMPLANT
ELECT REM PT RETURN 9FT ADLT (ELECTROSURGICAL) ×3
ELECTRODE REM PT RTRN 9FT ADLT (ELECTROSURGICAL) ×2 IMPLANT
GLOVE BIO SURGEON STRL SZ7.5 (GLOVE) ×3 IMPLANT
GOWN STRL REUS W/ TWL LRG LVL3 (GOWN DISPOSABLE) ×7 IMPLANT
GOWN STRL REUS W/TWL LRG LVL3 (GOWN DISPOSABLE) ×12
KIT BASIN OR (CUSTOM PROCEDURE TRAY) ×3 IMPLANT
KIT TURNOVER KIT B (KITS) ×3 IMPLANT
LOOP VESSEL MINI RED (MISCELLANEOUS) IMPLANT
NS IRRIG 1000ML POUR BTL (IV SOLUTION) ×3 IMPLANT
PACK CV ACCESS (CUSTOM PROCEDURE TRAY) ×3 IMPLANT
PAD ARMBOARD 7.5X6 YLW CONV (MISCELLANEOUS) ×6 IMPLANT
SPONGE SURGIFOAM ABS GEL 100 (HEMOSTASIS) IMPLANT
SUT PROLENE 6 0 BV (SUTURE) ×3 IMPLANT
SUT PROLENE 7 0 BV 1 (SUTURE) ×1 IMPLANT
SUT VIC AB 3-0 SH 27 (SUTURE) ×3
SUT VIC AB 3-0 SH 27X BRD (SUTURE) ×2 IMPLANT
SUT VICRYL 4-0 PS2 18IN ABS (SUTURE) ×3 IMPLANT
TOWEL GREEN STERILE (TOWEL DISPOSABLE) ×3 IMPLANT
UNDERPAD 30X30 (UNDERPADS AND DIAPERS) ×3 IMPLANT
WATER STERILE IRR 1000ML POUR (IV SOLUTION) ×1 IMPLANT

## 2017-08-28 NOTE — Interval H&P Note (Signed)
History and Physical Interval Note:  08/28/2017 9:54 AM  Wayne [name]  has presented today for surgery, with the diagnosis of end stage renal disease  The various methods of treatment have been discussed with the patient and family. After consideration of risks, benefits and other options for treatment, the patient has consented to  Procedure(s): ARTERIOVENOUS (AV) FISTULA CREATION VERSUS GRAFT UPPER ARM (Right) as a surgical intervention .  The patient's history has been reviewed, patient examined, no change in status, stable for surgery.  I have reviewed the patient's chart and labs.  Questions were answered to the patient's satisfaction.     Ruta Hinds

## 2017-08-28 NOTE — Transfer of Care (Signed)
Immediate Anesthesia Transfer of Care Note  Patient: Wayne [name]  Procedure(s) Performed: Exploration of Right Axillary Vein (Right Axilla)  Patient Location: PACU  Anesthesia Type:General  Level of Consciousness: awake, alert , oriented and patient cooperative  Airway & Oxygen Therapy: Patient Spontanous Breathing and Patient connected to nasal cannula oxygen  Post-op Assessment: Report given to RN, Post -op Vital signs reviewed and stable and Patient moving all extremities  Post vital signs: Reviewed and stable  Last Vitals:  Vitals Value Taken Time  BP 148/79 08/28/2017 12:01 PM  Temp    Pulse 66 08/28/2017 12:03 PM  Resp 14 08/28/2017 12:03 PM  SpO2 100 % 08/28/2017 12:03 PM  Vitals shown include unvalidated device data.  Last Pain:  Vitals:   08/28/17 0819  PainSc: 0-No pain      Patients Stated Pain Goal: 6 (91/63/84 6659)  Complications: No apparent anesthesia complications

## 2017-08-28 NOTE — Op Note (Signed)
Procedure: Exploration of right axillary vein  Preoperative diagnosis: End-stage renal disease  Postoperative diagnosis: Same  Anesthesia: General  Assistant: Leontine Locket, PA-C  Operative findings: #1 occluded right axillary vein with intravascular stent unable to access the vein proximal to stent  Operative details: After obtaining informed consent, the patient was taken to the operating room.  The patient was placed in supine position operating table.  After induction of general anesthesia and placement of a laryngeal mass, patient's right upper extremity was prepped and draped in usual sterile fashion.  Next a longitudinal incision was made in the right axilla carried down through the subtendinous tissues down the level of the axillary vein.  The major trunks of the brachial plexus were adherent to and adjacent to the vein which made dissection fairly tedious and difficult.  I was able to carry the dissection up to the level of the axillary vein where a stent was palpable within the lumen of the vein.  I was unable to feel above the area of the stent all the way up to the chest wall.  I had extended the incision all the way up to the level of the pectoralis muscle and could not make this much higher.  I did not think I had enough room to place a graft above the area of the vein and the vein was of poor quality in the location that I had exposed.  At this point attempts to place a right arm AV graft were aborted.  Subcutaneous tissues were reapproximated using a running 3-0 Vicryl suture.  The skin was closed with a 4-0 Vicryl subcuticular stitch.  The patient tolerated procedure well and there were no complications.  The instrument sponge needle count was correct at the end of the case.  The patient was taken to the recovery room in stable condition.  The patient will be scheduled in the near future for a left central venogram and consideration for placement of a left arm AV graft versus a  thigh graft.  Ruta Hinds, MD [facility] [place] Office: [phone] Pager: [phone]

## 2017-08-28 NOTE — Anesthesia Procedure Notes (Signed)
Procedure Name: LMA Insertion Date/Time: 08/28/2017 10:37 AM Performed by: Izora Gala, CRNA Pre-anesthesia Checklist: Patient identified, Emergency Drugs available, Suction available and Patient being monitored Patient Re-evaluated:Patient Re-evaluated prior to induction Oxygen Delivery Method: Circle system utilized Preoxygenation: Pre-oxygenation with 100% oxygen Induction Type: IV induction Ventilation: Mask ventilation without difficulty LMA: LMA inserted LMA Size: 5.0 Number of attempts: 1 Placement Confirmation: positive ETCO2 Tube secured with: Tape Dental Injury: Teeth and Oropharynx as per pre-operative assessment

## 2017-08-28 NOTE — Discharge Instructions (Signed)
° °  [facility]  Discharge Instructions  AV Fistula or Graft Surgery for Dialysis Access  Please refer to the following instructions for your post-procedure care. Your surgeon or physician assistant will discuss any changes with you.  Activity  You may drive the day following your surgery, if you are comfortable and no longer taking prescription pain medication. Resume full activity as the soreness in your incision resolves.  Bathing/Showering  You may shower after you go home. Keep your incision dry for 48 hours. Do not soak in a bathtub, hot tub, or swim until the incision heals completely. You may not shower if you have a hemodialysis catheter.  Incision Care  Clean your incision with mild soap and water after 48 hours. Pat the area dry with a clean towel. You do not need a bandage unless otherwise instructed. Do not apply any ointments or creams to your incision. You may have skin glue on your incision. Do not peel it off. It will come off on its own in about one week. Your arm may swell a bit after surgery. To reduce swelling use pillows to elevate your arm so it is above your heart. Your doctor will tell you if you need to lightly wrap your arm with an ACE bandage.  Diet  Resume your normal diet. There are not special food restrictions following this procedure. In order to heal from your surgery, it is CRITICAL to get adequate nutrition. Your body requires vitamins, minerals, and protein. Vegetables are the best source of vitamins and minerals. Vegetables also provide the perfect balance of protein. Processed food has little nutritional value, so try to avoid this.  Medications  Resume taking all of your medications. If your incision is causing pain, you may take over-the counter pain relievers such as acetaminophen (Tylenol). If you were prescribed a stronger pain medication, please be aware these medications can cause nausea and constipation. Prevent  nausea by taking the medication with a snack or meal. Avoid constipation by drinking plenty of fluids and eating foods with high amount of fiber, such as fruits, vegetables, and grains.  Do not take Tylenol if you are taking prescription pain medications.  Follow up Your surgeon may want to see you in the office following your access surgery. If so, this will be arranged at the time of your surgery.  Please call us immediately for any of the following conditions:  Increased pain, redness, drainage (pus) from your incision site Fever of 101 degrees or higher Severe or worsening pain at your incision site Hand pain or numbness.  Reduce your risk of vascular disease:  Stop smoking. If you would like help, call [facility] at 1-800-QUIT-NOW (714)581-9750) or [place] at [name] your cholesterol Maintain a desired weight Control your diabetes Keep your blood pressure down  Dialysis  It will take several weeks to several months for your new dialysis access to be ready for use. Your surgeon will determine when it is okay to use it. Your nephrologist will continue to direct your dialysis. You can continue to use your Permcath until your new access is ready for use.   08/28/2017 Wayne [name] [id number] May 19, 1950  Surgeon(s): Elam Dutch, MD  Procedure(s): Exploration of Right Axillary Vein    If you have any questions, please call the office at [phone].

## 2017-08-28 NOTE — Anesthesia Preprocedure Evaluation (Addendum)
Anesthesia Evaluation  Patient identified by MRN, date of birth, ID band Patient awake    Reviewed: Allergy & Precautions, NPO status , Patient's Chart, lab work & pertinent test results  Airway Mallampati: II  TM Distance: >3 FB Neck ROM: Full    Dental  (+) Missing, Chipped,    Pulmonary former smoker,  Sarcoidosis   Pulmonary exam normal breath sounds clear to auscultation       Cardiovascular hypertension, Pt. on medications + Peripheral Vascular Disease  Normal cardiovascular exam Rhythm:Regular Rate:Normal  ECG: SR, rate 88   Neuro/Psych negative neurological ROS  negative psych ROS   GI/Hepatic Neg liver ROS, GERD  Controlled,  Endo/Other  diabetes, Insulin Dependent, Oral Hypoglycemic Agents  Renal/GU ESRF and DialysisRenal diseaseOn HD M,W,F     Musculoskeletal negative musculoskeletal ROS (+)   Abdominal   Peds  Hematology  (+) anemia , HLD   Anesthesia Other Findings end stage renal disease  Reproductive/Obstetrics                            Anesthesia Physical Anesthesia Plan  ASA: IV  Anesthesia Plan: MAC   Post-op Pain Management:    Induction: Intravenous  PONV Risk Score and Plan: 1 and Treatment may vary due to age or medical condition  Airway Management Planned: Simple Face Mask  Additional Equipment:   Intra-op Plan:   Post-operative Plan:   Informed Consent: I have reviewed the patients History and Physical, chart, labs and discussed the procedure including the risks, benefits and alternatives for the proposed anesthesia with the patient or authorized representative who has indicated his/her understanding and acceptance.   Dental advisory given  Plan Discussed with: CRNA  Anesthesia Plan Comments:         Anesthesia Quick Evaluation

## 2017-08-28 NOTE — Anesthesia Postprocedure Evaluation (Signed)
Anesthesia Post Note  Patient: Wayne [name]  Procedure(s) Performed: Exploration of Right Axillary Vein (Right Axilla)     Patient location during evaluation: PACU Anesthesia Type: General Level of consciousness: awake and alert Pain management: pain level controlled Vital Signs Assessment: post-procedure vital signs reviewed and stable Respiratory status: spontaneous breathing, nonlabored ventilation, respiratory function stable and patient connected to nasal cannula oxygen Cardiovascular status: blood pressure returned to baseline and stable Postop Assessment: no apparent nausea or vomiting Anesthetic complications: no    Last Vitals:  Vitals:   08/28/17 1245 08/28/17 1315  BP: (!) 157/71 (!) 165/65  Pulse: 62 62  Resp: 11 14  Temp:    SpO2: 100% 98%    Last Pain:  Vitals:   08/28/17 1315  PainSc: 0-No pain                 Jatin Naumann P Jasmon Graffam

## 2017-08-29 ENCOUNTER — Encounter (HOSPITAL_COMMUNITY): Payer: Self-pay | Admitting: Vascular Surgery

## 2017-08-31 NOTE — Addendum Note (Signed)
Addendum  created 08/31/17 1001 by Genola Yuille, CRNA   Intraprocedure Event edited    

## 2017-10-02 ENCOUNTER — Other Ambulatory Visit: Payer: Self-pay

## 2017-10-02 DIAGNOSIS — I739 Peripheral vascular disease, unspecified: Secondary | ICD-10-CM

## 2017-10-04 ENCOUNTER — Other Ambulatory Visit: Payer: Self-pay | Admitting: *Deleted

## 2017-10-04 ENCOUNTER — Ambulatory Visit (HOSPITAL_COMMUNITY)
Admission: RE | Admit: 2017-10-04 | Discharge: 2017-10-04 | Disposition: A | Discharge status: Home / Self Care | Payer: Medicare Other | Source: Ambulatory Visit | Attending: Vascular Surgery | Admitting: Vascular Surgery

## 2017-10-04 ENCOUNTER — Encounter: Payer: Self-pay | Admitting: Vascular Surgery

## 2017-10-04 ENCOUNTER — Ambulatory Visit (INDEPENDENT_AMBULATORY_CARE_PROVIDER_SITE_OTHER): Payer: Medicare Other | Admitting: Vascular Surgery

## 2017-10-04 ENCOUNTER — Other Ambulatory Visit: Payer: Self-pay

## 2017-10-04 ENCOUNTER — Encounter: Payer: Self-pay | Admitting: *Deleted

## 2017-10-04 VITALS — BP 171/80 | HR 60 | Resp 18 | Ht 73.0 in | Wt 159.0 lb

## 2017-10-04 DIAGNOSIS — Z992 Dependence on renal dialysis: Secondary | ICD-10-CM

## 2017-10-04 DIAGNOSIS — I739 Peripheral vascular disease, unspecified: Secondary | ICD-10-CM

## 2017-10-04 DIAGNOSIS — N186 End stage renal disease: Secondary | ICD-10-CM

## 2017-10-04 DIAGNOSIS — R6889 Other general symptoms and signs: Secondary | ICD-10-CM | POA: Diagnosis not present

## 2017-10-04 NOTE — H&P (View-Only) (Signed)
Patient is a 67 year old male who presents today for evaluation for permanent hemodialysis access.  The patient previously had a left brachiocephalic AV fistula which has failed.  He also has a right forearm AV graft which recently has failed.  He was actually seen by my partner Dr. Scot Dock August 2017.  His forearm graft was failing at that point.  He was set up for a central venogram suggested he might be a candidate for upper arm graft however on exploration of his right axillary vein I could not get high enough on the vein to plug in for an upper arm graft..   His current dialysis schedule is Monday Wednesday Friday.  He is using a right-sided catheter.  He is right-handed.  However, he would prefer to have an access placed left arm again.  He denies any numbness or tingling in his right hand or left hand.  Consideration also being given for thigh graft.  Past Medical History:  Diagnosis Date  . Anemia of chronic disease   . ESRD (end stage renal disease) ([place])    Hemodialysis since December 2015  . Essential hypertension   . GERD (gastroesophageal reflux disease)    PMH:   . History of kidney stones   . Hypercholesteremia   . Peripheral vascular disease ([place])   . Sarcoidosis   . Type 2 diabetes mellitus ([place])     Past Surgical History:  Procedure Laterality Date  . AMPUTATION OF REPLICATED TOES Right 4665   3rd, 4th, 5th digits (toes) of right foot  . AV FISTULA PLACEMENT Left 12/31/2012   Procedure: ARTERIOVENOUS (AV) FISTULA CREATION-LEFT;  Surgeon: Angelia Mould, MD;  Location: [facility];  Service: Vascular;  Laterality: Left;  . AV FISTULA PLACEMENT Right 11/03/2014   Procedure: INSERTION OF RIGHT ARM  ARTERIOVENOUS (AV) GORE-TEX GRAFT;  Surgeon: Angelia Mould, MD;  Location: [facility];  Service: Vascular;  Laterality: Right;  . BELOW KNEE LEG AMPUTATION Left   . COLONOSCOPY    . COLONOSCOPY N/A 02/22/2017   Procedure: COLONOSCOPY;  Surgeon: Rogene Houston, MD;  [facility];  Service: Endoscopy;  Laterality: N/A;  830  . ESOPHAGEAL DILATION     8-10 yrs ago   . EYE SURGERY Right   . FEMORAL ARTERY EXPLORATION Right 08/28/2017   Procedure: Exploration of Right Axillary Vein;  Surgeon: Elam Dutch, MD;  Location: [facility];  Service: Vascular;  Laterality: Right;  . FISTULOGRAM N/A 07/27/2014   Procedure: FISTULOGRAM;  Surgeon: Angelia Mould, MD;  Location: [facility] CATH LAB;  Service: Cardiovascular;  Laterality: N/A;  . IR FLUORO GUIDE CV LINE RIGHT  07/10/2017  . IR FLUORO GUIDE CV LINE RIGHT  07/11/2017  . IR US GUIDE VASC ACCESS RIGHT  07/10/2017  . UPPER EXTREMITY VENOGRAPHY Right 08/21/2017   Procedure: UPPER EXTREMITY VENOGRAPHY;  Surgeon: Serafina Mitchell, MD;  Location: [facility] CV LAB;  Service: Cardiovascular;  Laterality: Right;    Physical exam:  Vitals:   10/04/17 1628 10/04/17 1632  BP: (!) 174/79 (!) 171/80  Pulse: 60 60  Resp: 18   SpO2: 99%   Weight: 159 lb (72.1 kg)   Height: 6\' 1"  (1.854 m)     Extremities: 2+ femoral pulses bilaterally, well-healed left below-knee amputation, well-healed transmetatarsal amputation of toes 1 to right foot, 2+ brachial absent left radial pulse  Data patient had ABI of the right leg today.  This showed noncompressible vessels.  However, toe pressure was greater  than 100.  Assessment: Patient is not a candidate for any further right upper extremity access.  Hopefully his left central veins may be patent to consider a left upper arm AV graft.  If not the fallback plan would be a left thigh graft.  Plan: Patient will be scheduled for a left central venogram by Dr. Bridgett Larsson October 11, 2017.  Plan would be for an upper arm graft or thigh graft pending the results of this.  Ruta Hinds, MD [facility] Office: [phone] Pager: [phone]

## 2017-10-04 NOTE — Progress Notes (Signed)
Vitals:   10/04/17 1628  BP: (!) 174/79  Pulse: 60  Resp: 18  SpO2: 99%  Weight: 159 lb (72.1 kg)  Height: 6\' 1"  (1.854 m)

## 2017-10-04 NOTE — H&P (View-Only) (Signed)
Patient is a 67 year old male who presents today for evaluation for permanent hemodialysis access.  The patient previously had a left brachiocephalic AV fistula which has failed.  He also has a right forearm AV graft which recently has failed.  He was actually seen by my partner Dr. Scot Dock August 2017.  His forearm graft was failing at that point.  He was set up for a central venogram suggested he might be a candidate for upper arm graft however on exploration of his right axillary vein I could not get high enough on the vein to plug in for an upper arm graft..   His current dialysis schedule is Monday Wednesday Friday.  He is using a right-sided catheter.  He is right-handed.  However, he would prefer to have an access placed left arm again.  He denies any numbness or tingling in his right hand or left hand.  Consideration also being given for thigh graft.  Past Medical History:  Diagnosis Date  . Anemia of chronic disease   . ESRD (end stage renal disease) ([place])    Hemodialysis since December 2015  . Essential hypertension   . GERD (gastroesophageal reflux disease)    PMH:   . History of kidney stones   . Hypercholesteremia   . Peripheral vascular disease ([place])   . Sarcoidosis   . Type 2 diabetes mellitus ([place])     Past Surgical History:  Procedure Laterality Date  . AMPUTATION OF REPLICATED TOES Right 8115   3rd, 4th, 5th digits (toes) of right foot  . AV FISTULA PLACEMENT Left 12/31/2012   Procedure: ARTERIOVENOUS (AV) FISTULA CREATION-LEFT;  Surgeon: Angelia Mould, MD;  Location: [place];  Service: Vascular;  Laterality: Left;  . AV FISTULA PLACEMENT Right 11/03/2014   Procedure: INSERTION OF RIGHT ARM  ARTERIOVENOUS (AV) GORE-TEX GRAFT;  Surgeon: Angelia Mould, MD;  Location: [place];  Service: Vascular;  Laterality: Right;  . BELOW KNEE LEG AMPUTATION Left   . COLONOSCOPY    . COLONOSCOPY N/A 02/22/2017   Procedure: COLONOSCOPY;  Surgeon: Rogene Houston, MD;  [facility];  Service: Endoscopy;  Laterality: N/A;  830  . ESOPHAGEAL DILATION     8-10 yrs ago   . EYE SURGERY Right   . FEMORAL ARTERY EXPLORATION Right 08/28/2017   Procedure: Exploration of Right Axillary Vein;  Surgeon: Elam Dutch, MD;  Location: [place];  Service: Vascular;  Laterality: Right;  . FISTULOGRAM N/A 07/27/2014   Procedure: FISTULOGRAM;  Surgeon: Angelia Mould, MD;  Location: [facility] LAB;  Service: Cardiovascular;  Laterality: N/A;  . IR FLUORO GUIDE CV LINE RIGHT  07/10/2017  . IR FLUORO GUIDE CV LINE RIGHT  07/11/2017  . IR US GUIDE VASC ACCESS RIGHT  07/10/2017  . UPPER EXTREMITY VENOGRAPHY Right 08/21/2017   Procedure: UPPER EXTREMITY VENOGRAPHY;  Surgeon: Serafina Mitchell, MD;  Location: [place] CV LAB;  Service: Cardiovascular;  Laterality: Right;    Physical exam:  Vitals:   10/04/17 1628 10/04/17 1632  BP: (!) 174/79 (!) 171/80  Pulse: 60 60  Resp: 18   SpO2: 99%   Weight: 159 lb (72.1 kg)   Height: 6\' 1"  (1.854 m)     Extremities: 2+ femoral pulses bilaterally, well-healed left below-knee amputation, well-healed transmetatarsal amputation of toes 1 to right foot, 2+ brachial absent left radial pulse  Data patient had ABI of the right leg today.  This showed noncompressible vessels.  However, toe pressure was greater  than 100.  Assessment: Patient is not a candidate for any further right upper extremity access.  Hopefully his left central veins may be patent to consider a left upper arm AV graft.  If not the fallback plan would be a left thigh graft.  Plan: Patient will be scheduled for a left central venogram by Dr. Bridgett Larsson October 11, 2017.  Plan would be for an upper arm graft or thigh graft pending the results of this.  Ruta Hinds, MD [facility] [place] Office: [phone] Pager: [phone]

## 2017-10-04 NOTE — Progress Notes (Signed)
Patient is a 67 year old male who presents today for evaluation for permanent hemodialysis access.  The patient previously had a left brachiocephalic AV fistula which has failed.  He also has a right forearm AV graft which recently has failed.  He was actually seen by my partner Dr. Scot Dock August 2017.  His forearm graft was failing at that point.  He was set up for a central venogram suggested he might be a candidate for upper arm graft however on exploration of his right axillary vein I could not get high enough on the vein to plug in for an upper arm graft..   His current dialysis schedule is Monday Wednesday Friday.  He is using a right-sided catheter.  He is right-handed.  However, he would prefer to have an access placed left arm again.  He denies any numbness or tingling in his right hand or left hand.  Consideration also being given for thigh graft.  Past Medical History:  Diagnosis Date  . Anemia of chronic disease   . ESRD (end stage renal disease) ([place])    Hemodialysis since December 2015  . Essential hypertension   . GERD (gastroesophageal reflux disease)    PMH:   . History of kidney stones   . Hypercholesteremia   . Peripheral vascular disease ([place])   . Sarcoidosis   . Type 2 diabetes mellitus ([place])     Past Surgical History:  Procedure Laterality Date  . AMPUTATION OF REPLICATED TOES Right 1324   3rd, 4th, 5th digits (toes) of right foot  . AV FISTULA PLACEMENT Left 12/31/2012   Procedure: ARTERIOVENOUS (AV) FISTULA CREATION-LEFT;  Surgeon: Angelia Mould, MD;  Location: [place];  Service: Vascular;  Laterality: Left;  . AV FISTULA PLACEMENT Right 11/03/2014   Procedure: INSERTION OF RIGHT ARM  ARTERIOVENOUS (AV) GORE-TEX GRAFT;  Surgeon: Angelia Mould, MD;  Location: [place];  Service: Vascular;  Laterality: Right;  . BELOW KNEE LEG AMPUTATION Left   . COLONOSCOPY    . COLONOSCOPY N/A 02/22/2017   Procedure: COLONOSCOPY;  Surgeon: Rogene Houston, MD;  [facility];  Service: Endoscopy;  Laterality: N/A;  830  . ESOPHAGEAL DILATION     8-10 yrs ago   . EYE SURGERY Right   . FEMORAL ARTERY EXPLORATION Right 08/28/2017   Procedure: Exploration of Right Axillary Vein;  Surgeon: Elam Dutch, MD;  Location: [place];  Service: Vascular;  Laterality: Right;  . FISTULOGRAM N/A 07/27/2014   Procedure: FISTULOGRAM;  Surgeon: Angelia Mould, MD;  Location: [facility] CATH LAB;  Service: Cardiovascular;  Laterality: N/A;  . IR FLUORO GUIDE CV LINE RIGHT  07/10/2017  . IR FLUORO GUIDE CV LINE RIGHT  07/11/2017  . IR US GUIDE VASC ACCESS RIGHT  07/10/2017  . UPPER EXTREMITY VENOGRAPHY Right 08/21/2017   Procedure: UPPER EXTREMITY VENOGRAPHY;  Surgeon: Serafina Mitchell, MD;  Location: [place] CV LAB;  Service: Cardiovascular;  Laterality: Right;    Physical exam:  Vitals:   10/04/17 1628 10/04/17 1632  BP: (!) 174/79 (!) 171/80  Pulse: 60 60  Resp: 18   SpO2: 99%   Weight: 159 lb (72.1 kg)   Height: 6\' 1"  (1.854 m)     Extremities: 2+ femoral pulses bilaterally, well-healed left below-knee amputation, well-healed transmetatarsal amputation of toes 1 to right foot, 2+ brachial absent left radial pulse  Data patient had ABI of the right leg today.  This showed noncompressible vessels.  However, toe pressure was greater  than 100.  Assessment: Patient is not a candidate for any further right upper extremity access.  Hopefully his left central veins may be patent to consider a left upper arm AV graft.  If not the fallback plan would be a left thigh graft.  Plan: Patient will be scheduled for a left central venogram by Dr. Bridgett Larsson October 11, 2017.  Plan would be for an upper arm graft or thigh graft pending the results of this.  Ruta Hinds, MD [facility] [place] Office: [phone] Pager: [phone]

## 2017-10-11 ENCOUNTER — Encounter (HOSPITAL_COMMUNITY)
Admission: RE | Disposition: A | Discharge status: Home / Self Care | Payer: Self-pay | Source: Ambulatory Visit | Attending: Vascular Surgery

## 2017-10-11 ENCOUNTER — Other Ambulatory Visit: Payer: Self-pay | Admitting: *Deleted

## 2017-10-11 ENCOUNTER — Telehealth: Payer: Self-pay | Admitting: *Deleted

## 2017-10-11 ENCOUNTER — Encounter (HOSPITAL_COMMUNITY): Payer: Self-pay | Admitting: Vascular Surgery

## 2017-10-11 ENCOUNTER — Ambulatory Visit (HOSPITAL_COMMUNITY)
Admission: RE | Admit: 2017-10-11 | Discharge: 2017-10-11 | Disposition: A | Discharge status: Home / Self Care | Payer: Medicare Other | Source: Ambulatory Visit | Attending: Vascular Surgery | Admitting: Vascular Surgery

## 2017-10-11 DIAGNOSIS — Z89421 Acquired absence of other right toe(s): Secondary | ICD-10-CM | POA: Insufficient documentation

## 2017-10-11 DIAGNOSIS — E1122 Type 2 diabetes mellitus with diabetic chronic kidney disease: Secondary | ICD-10-CM | POA: Insufficient documentation

## 2017-10-11 DIAGNOSIS — Z992 Dependence on renal dialysis: Secondary | ICD-10-CM | POA: Insufficient documentation

## 2017-10-11 DIAGNOSIS — E1151 Type 2 diabetes mellitus with diabetic peripheral angiopathy without gangrene: Secondary | ICD-10-CM | POA: Insufficient documentation

## 2017-10-11 DIAGNOSIS — K219 Gastro-esophageal reflux disease without esophagitis: Secondary | ICD-10-CM | POA: Insufficient documentation

## 2017-10-11 DIAGNOSIS — Z89512 Acquired absence of left leg below knee: Secondary | ICD-10-CM | POA: Insufficient documentation

## 2017-10-11 DIAGNOSIS — D869 Sarcoidosis, unspecified: Secondary | ICD-10-CM | POA: Diagnosis not present

## 2017-10-11 DIAGNOSIS — I120 Hypertensive chronic kidney disease with stage 5 chronic kidney disease or end stage renal disease: Secondary | ICD-10-CM | POA: Insufficient documentation

## 2017-10-11 DIAGNOSIS — N186 End stage renal disease: Secondary | ICD-10-CM | POA: Insufficient documentation

## 2017-10-11 DIAGNOSIS — E7800 Pure hypercholesterolemia, unspecified: Secondary | ICD-10-CM | POA: Insufficient documentation

## 2017-10-11 DIAGNOSIS — N184 Chronic kidney disease, stage 4 (severe): Secondary | ICD-10-CM | POA: Diagnosis not present

## 2017-10-11 HISTORY — PX: UPPER EXTREMITY VENOGRAPHY: CATH118272

## 2017-10-11 LAB — POCT I-STAT, CHEM 8
BUN: 20 mg/dL (ref 8–23)
CALCIUM ION: 1.04 mmol/L — AB (ref 1.15–1.40)
CHLORIDE: 100 mmol/L (ref 98–111)
CREATININE: 7.7 mg/dL — AB (ref 0.61–1.24)
GLUCOSE: 82 mg/dL (ref 70–99)
HCT: 34 % — ABNORMAL LOW (ref 39.0–52.0)
Hemoglobin: 11.6 g/dL — ABNORMAL LOW (ref 13.0–17.0)
POTASSIUM: 3.7 mmol/L (ref 3.5–5.1)
Sodium: 141 mmol/L (ref 135–145)
TCO2: 29 mmol/L (ref 22–32)

## 2017-10-11 LAB — GLUCOSE, CAPILLARY: Glucose-Capillary: 91 mg/dL (ref 70–99)

## 2017-10-11 SURGERY — UPPER EXTREMITY VENOGRAPHY
Anesthesia: LOCAL

## 2017-10-11 MED ORDER — IODIXANOL 320 MG/ML IV SOLN
INTRAVENOUS | Status: DC | PRN
  Administered 2017-10-11: 20 mL via INTRAVENOUS

## 2017-10-11 MED ORDER — SODIUM CHLORIDE 0.9% FLUSH: 3.0000 mL | INTRAVENOUS | Status: DC | PRN

## 2017-10-11 MED ORDER — ONDANSETRON HCL 4 MG/2ML IJ SOLN: 4.0000 mg | Freq: Four times a day (QID) | INTRAMUSCULAR | Status: DC | PRN

## 2017-10-11 MED ORDER — SODIUM CHLORIDE 0.9 % IV SOLN: 250.0000 mL | INTRAVENOUS | Status: DC | PRN

## 2017-10-11 MED ORDER — SODIUM CHLORIDE 0.9% FLUSH: 3.0000 mL | Freq: Two times a day (BID) | INTRAVENOUS | Status: DC

## 2017-10-11 MED ORDER — LABETALOL HCL 5 MG/ML IV SOLN: 10.0000 mg | INTRAVENOUS | Status: DC | PRN

## 2017-10-11 MED ORDER — HYDRALAZINE HCL 20 MG/ML IJ SOLN: 5.0000 mg | INTRAMUSCULAR | Status: DC | PRN

## 2017-10-11 MED ORDER — ACETAMINOPHEN 325 MG PO TABS: 650.0000 mg | ORAL_TABLET | ORAL | Status: DC | PRN

## 2017-10-11 SURGICAL SUPPLY — 2 items
STOPCOCK MORSE 400PSI 3WAY (MISCELLANEOUS) ×1 IMPLANT
TUBING CIL FLEX 10 FLL-RA (TUBING) ×1 IMPLANT

## 2017-10-11 NOTE — Interval H&P Note (Signed)
History and Physical Interval Note:  10/11/2017 7:12 AM  Wayne [name]  has presented today for surgery, with the diagnosis of poor flow  The various methods of treatment have been discussed with the patient and family. After consideration of risks, benefits and other options for treatment, the patient has consented to  Procedure(s): UPPER EXTREMITY VENOGRAPHY (N/A) as a surgical intervention .  The patient's history has been reviewed, patient examined, no change in status, stable for surgery.  I have reviewed the patient's chart and labs.  Questions were answered to the patient's satisfaction.     Adele Barthel

## 2017-10-11 NOTE — Discharge Instructions (Signed)
Venogram, Care After °This sheet gives you information about how to care for yourself after your procedure. Your health care provider may also give you more specific instructions. If you have problems or questions, contact your health care provider. °What can I expect after the procedure? °After the procedure, it is common to have: °· Bruising or mild discomfort in the area where the IV was inserted (insertion site). ° °Follow these instructions at home: °Eating and drinking °· Follow instructions from your health care provider about eating or drinking restrictions. °· Drink a lot of fluids for the first several days after the procedure, as directed by your health care provider. This helps to wash (flush) the contrast out of your body. Examples of healthy fluids include water or low-calorie drinks. °General instructions °· Check your IV insertion area every day for signs of infection. Check for: °? Redness, swelling, or pain. °? Fluid or blood. °? Warmth. °? Pus or a bad smell. °· Take over-the-counter and prescription medicines only as told by your health care provider. °· Rest and return to your normal activities as told by your health care provider. Ask your health care provider what activities are safe for you. °· Do not drive for 24 hours if you were given a medicine to help you relax (sedative), or until your health care provider approves. °· Keep all follow-up visits as told by your health care provider. This is important. °Contact a health care provider if: °· Your skin becomes itchy or you develop a rash or hives. °· You have a fever that does not get better with medicine. °· You feel nauseous. °· You vomit. °· You have redness, swelling, or pain around the insertion site. °· You have fluid or blood coming from the insertion site. °· Your insertion area feels warm to the touch. °· You have pus or a bad smell coming from the insertion site. °Get help right away if: °· You have difficulty breathing or  shortness of breath. °· You develop chest pain. °· You faint. °· You feel very dizzy. °These symptoms may represent a serious problem that is an emergency. Do not wait to see if the symptoms will go away. Get medical help right away. Call your local emergency services (911 in the [place] Do not drive yourself to the hospital. °Summary °· After your procedure, it is common to have bruising or mild discomfort in the area where the IV was inserted. °· You should check your IV insertion area every day for signs of infection. °· Take over-the-counter and prescription medicines only as told by your health care provider. °· You should drink a lot of fluids for the first several days after the procedure to help flush the contrast from your body. °This information is not intended to replace advice given to you by your health care provider. Make sure you discuss any questions you have with your health care provider. °Document Released: 01/22/2013 Document Revised: 02/26/2016 Document Reviewed: 02/26/2016 °Elsevier Interactive Patient Education © 2017 [employer] Inc[employer] ° °

## 2017-10-11 NOTE — Telephone Encounter (Signed)
-----   Message from Penni Homans, RN sent at 10/11/2017 11:18 AM EDT ----- [name] ----- Message ----- From: Conrad Buck Run, MD Sent: 10/11/2017   7:30 AM To: Vvs Charge Pool  Wayne [name] [id number] March 27, 1951  PROCEDURE: 1.  left arm and central venogram   Schedule: Pt needs to be scheduled with Dr. Oneida Alar for a L arm arteriovenous graft on a Tuesday

## 2017-10-11 NOTE — Progress Notes (Signed)
Phone call to patient instructed to be at [facility] admitting department at 5:30 am on 10/23/17 for A/V graft. NPO past MN night prior and must have a driver for home. Continue ASA. Expect a call and follow the detailed instructions received from the hospital pre-admission department about this surgery. Verbalizes understanding.

## 2017-10-11 NOTE — Op Note (Signed)
    OPERATIVE NOTE   PROCEDURE: 1.  left arm and central venogram   PRE-OPERATIVE DIAGNOSIS: end stage renal disease  POST-OPERATIVE DIAGNOSIS: same as above   SURGEON: Adele Barthel, MD  ANESTHESIA: local  ESTIMATED BLOOD LOSS: 5 cc  FINDING(S): 1. Patent left innominate vein draining into superior vena cava  2. Patent left subclavian vein   3. Patent axillary reconstituted from paired brachial veins 4. Patent basilic vein too small for use as conduit  SPECIMEN(S):  None  CONTRAST: 20 cc  INDICATIONS: Wayne [name] is a 67 y.o. male who presents with end stage renal disease.  The patient is scheduled for left arm and central venogram to help determine the availability of proximal veins for permanent access placement.  The patient is aware the risks include but are not limited to: bleeding, infection, thrombosis of the cannulated access, and possible anaphylactic reaction to the contrast.  The patient is aware of the risks of the procedure and elects to proceed forward.   DESCRIPTION: After full informed written consent was obtained, the patient was brought back to the angiography suite and placed supine upon the angiography table.  The patient was connected to monitoring equipment.  The left forearm IV was connected to IV extension tubing.  Hand injections were completed to image the arm veins and central venous structures, the findings of which are listed above.    The left forearm IV was connected to IV extension tubing.  Hand injections were completed to image the arm veins and central venous structures, the findings of which are listed above.    Based on the images, the patient is a candidate for a right arm arteriovenous graft.   COMPLICATIONS: none  CONDITION: stable   Adele Barthel, MD [facility] [place] Office: [phone] Pager: [phone]  10/11/2017 7:21 AM

## 2017-10-19 ENCOUNTER — Encounter (HOSPITAL_COMMUNITY): Payer: Self-pay | Admitting: *Deleted

## 2017-10-19 ENCOUNTER — Other Ambulatory Visit: Payer: Self-pay

## 2017-10-19 NOTE — Progress Notes (Signed)
Wayne [name] denies chest pain or shortness of breath. Wayne [name] reports thatt his CBG's run around 100- 106.  Patient takes Lantus if CBG > 130.  Patient reports that he was not taken Lantus since January of this year. I instructed patient to check CBG after awaking and every 2 hours until arrival  to the hospital.  I Instructed patient if CBG is less than 70 to take drink  1/2 cup of a clear juice. Recheck CBG in 15 minutes then call pre- op desk at [phone] for further instructions. If scheduled to receive Insulin, do not take Insulin.

## 2017-10-23 ENCOUNTER — Telehealth: Payer: Self-pay | Admitting: *Deleted

## 2017-10-23 ENCOUNTER — Other Ambulatory Visit: Payer: Self-pay | Admitting: *Deleted

## 2017-10-23 NOTE — Telephone Encounter (Signed)
Spoke to wife and new OR date given. To be at [facility] admitting department at 5:30 am on 10/30/17. All other instructions remain unchanged. Verbalized understanding. To call office or pre-admission department (number given) if any questions.

## 2017-10-29 ENCOUNTER — Telehealth: Payer: Self-pay | Admitting: *Deleted

## 2017-10-29 ENCOUNTER — Other Ambulatory Visit: Payer: Self-pay | Admitting: *Deleted

## 2017-10-29 NOTE — Progress Notes (Signed)
Mrs Liby returned my call regarding Mr Sterbenz's surgery in am.  Patient's surgery was cancelled last week.   Mrs Misuraca denies any change in patient's condition.  We went over medications to take. I instructed patient to check CBG after awaking and every 2 hours until arrival  to the hospital.  I Instructed patient if CBG is less than 70 to drink Glucose Gel or 1/2 cup of a clear juice. Recheck CBG in 15 minutes then call pre- op desk at [phone] for further instructions. If scheduled to receive Insulin, do not take Insulin.

## 2017-10-29 NOTE — Telephone Encounter (Signed)
Order for pre-op antibiotic entered.

## 2017-10-30 ENCOUNTER — Encounter (HOSPITAL_COMMUNITY): Payer: Self-pay

## 2017-10-30 ENCOUNTER — Encounter (HOSPITAL_COMMUNITY)
Admission: RE | Disposition: A | Discharge status: Home / Self Care | Payer: Self-pay | Source: Ambulatory Visit | Attending: Vascular Surgery

## 2017-10-30 ENCOUNTER — Ambulatory Visit (HOSPITAL_COMMUNITY): Payer: Medicare Other | Admitting: Anesthesiology

## 2017-10-30 ENCOUNTER — Ambulatory Visit (HOSPITAL_COMMUNITY)
Admission: RE | Admit: 2017-10-30 | Discharge: 2017-10-30 | Disposition: A | Discharge status: Home / Self Care | Payer: Medicare Other | Source: Ambulatory Visit | Attending: Vascular Surgery | Admitting: Vascular Surgery

## 2017-10-30 ENCOUNTER — Other Ambulatory Visit: Payer: Self-pay

## 2017-10-30 DIAGNOSIS — Z9889 Other specified postprocedural states: Secondary | ICD-10-CM | POA: Diagnosis not present

## 2017-10-30 DIAGNOSIS — E1122 Type 2 diabetes mellitus with diabetic chronic kidney disease: Secondary | ICD-10-CM | POA: Insufficient documentation

## 2017-10-30 DIAGNOSIS — E7800 Pure hypercholesterolemia, unspecified: Secondary | ICD-10-CM | POA: Insufficient documentation

## 2017-10-30 DIAGNOSIS — N186 End stage renal disease: Secondary | ICD-10-CM | POA: Insufficient documentation

## 2017-10-30 DIAGNOSIS — I739 Peripheral vascular disease, unspecified: Secondary | ICD-10-CM | POA: Insufficient documentation

## 2017-10-30 DIAGNOSIS — D631 Anemia in chronic kidney disease: Secondary | ICD-10-CM | POA: Insufficient documentation

## 2017-10-30 DIAGNOSIS — D869 Sarcoidosis, unspecified: Secondary | ICD-10-CM | POA: Diagnosis not present

## 2017-10-30 DIAGNOSIS — Z89512 Acquired absence of left leg below knee: Secondary | ICD-10-CM | POA: Insufficient documentation

## 2017-10-30 DIAGNOSIS — Z992 Dependence on renal dialysis: Secondary | ICD-10-CM | POA: Diagnosis not present

## 2017-10-30 DIAGNOSIS — K219 Gastro-esophageal reflux disease without esophagitis: Secondary | ICD-10-CM | POA: Insufficient documentation

## 2017-10-30 DIAGNOSIS — Z89421 Acquired absence of other right toe(s): Secondary | ICD-10-CM | POA: Diagnosis not present

## 2017-10-30 DIAGNOSIS — Z87442 Personal history of urinary calculi: Secondary | ICD-10-CM | POA: Diagnosis not present

## 2017-10-30 DIAGNOSIS — I120 Hypertensive chronic kidney disease with stage 5 chronic kidney disease or end stage renal disease: Secondary | ICD-10-CM | POA: Insufficient documentation

## 2017-10-30 HISTORY — PX: AV FISTULA PLACEMENT: SHX1204

## 2017-10-30 LAB — GLUCOSE, CAPILLARY
GLUCOSE-CAPILLARY: 97 mg/dL (ref 70–99)
GLUCOSE-CAPILLARY: 89 mg/dL (ref 70–99)

## 2017-10-30 LAB — POCT I-STAT 4, (NA,K, GLUC, HGB,HCT)
GLUCOSE: 87 mg/dL (ref 70–99)
HCT: 29 % — ABNORMAL LOW (ref 39.0–52.0)
Hemoglobin: 9.9 g/dL — ABNORMAL LOW (ref 13.0–17.0)
Potassium: 3.6 mmol/L (ref 3.5–5.1)
Sodium: 141 mmol/L (ref 135–145)

## 2017-10-30 SURGERY — INSERTION OF ARTERIOVENOUS (AV) GORE-TEX GRAFT ARM
Anesthesia: General | Site: Arm Upper | Laterality: Left

## 2017-10-30 MED ORDER — DEXAMETHASONE SODIUM PHOSPHATE 10 MG/ML IJ SOLN
INTRAMUSCULAR | Status: DC | PRN
  Administered 2017-10-30: 5 mg via INTRAVENOUS

## 2017-10-30 MED ORDER — LIDOCAINE HCL (CARDIAC) PF 100 MG/5ML IV SOSY
PREFILLED_SYRINGE | INTRAVENOUS | Status: DC | PRN
  Administered 2017-10-30: 60 mg via INTRAVENOUS

## 2017-10-30 MED ORDER — LIDOCAINE-EPINEPHRINE (PF) 1 %-1:200000 IJ SOLN
INTRAMUSCULAR | Status: AC
  Filled 2017-10-30: qty 30

## 2017-10-30 MED ORDER — MIDAZOLAM HCL 2 MG/2ML IJ SOLN
INTRAMUSCULAR | Status: AC
  Filled 2017-10-30: qty 2

## 2017-10-30 MED ORDER — SUCCINYLCHOLINE CHLORIDE 200 MG/10ML IV SOSY
PREFILLED_SYRINGE | INTRAVENOUS | Status: AC
  Filled 2017-10-30: qty 10

## 2017-10-30 MED ORDER — SODIUM CHLORIDE 0.9 % IV SOLN
INTRAVENOUS | Status: DC | PRN
  Administered 2017-10-30: 10 ug/min via INTRAVENOUS

## 2017-10-30 MED ORDER — FENTANYL CITRATE (PF) 250 MCG/5ML IJ SOLN
INTRAMUSCULAR | Status: AC
  Filled 2017-10-30: qty 5

## 2017-10-30 MED ORDER — HEPARIN SODIUM (PORCINE) 1000 UNIT/ML IJ SOLN
INTRAMUSCULAR | Status: DC | PRN
  Administered 2017-10-30: 6000 [IU] via INTRAVENOUS

## 2017-10-30 MED ORDER — PROTAMINE SULFATE 10 MG/ML IV SOLN
INTRAVENOUS | Status: DC | PRN
  Administered 2017-10-30: 40 mg via INTRAVENOUS

## 2017-10-30 MED ORDER — CEFAZOLIN SODIUM-DEXTROSE 2-4 GM/100ML-% IV SOLN
2.0000 g | INTRAVENOUS | Status: AC
  Administered 2017-10-30: 2 g via INTRAVENOUS
  Filled 2017-10-30: qty 100

## 2017-10-30 MED ORDER — FENTANYL CITRATE (PF) 100 MCG/2ML IJ SOLN
INTRAMUSCULAR | Status: DC | PRN
  Administered 2017-10-30: 50 ug via INTRAVENOUS
  Administered 2017-10-30: 25 ug via INTRAVENOUS
  Administered 2017-10-30: 100 ug via INTRAVENOUS

## 2017-10-30 MED ORDER — 0.9 % SODIUM CHLORIDE (POUR BTL) OPTIME
TOPICAL | Status: DC | PRN
  Administered 2017-10-30: 1000 mL

## 2017-10-30 MED ORDER — OXYCODONE HCL 5 MG/5ML PO SOLN: 5.0000 mg | Freq: Once | ORAL | Status: DC | PRN

## 2017-10-30 MED ORDER — CHLORHEXIDINE GLUCONATE 4 % EX LIQD: 60.0000 mL | Freq: Once | CUTANEOUS | Status: DC

## 2017-10-30 MED ORDER — PROPOFOL 10 MG/ML IV BOLUS
INTRAVENOUS | Status: DC | PRN
  Administered 2017-10-30: 180 mg via INTRAVENOUS

## 2017-10-30 MED ORDER — HEPARIN SODIUM (PORCINE) 1000 UNIT/ML IJ SOLN
INTRAMUSCULAR | Status: AC
  Filled 2017-10-30: qty 1

## 2017-10-30 MED ORDER — SODIUM CHLORIDE 0.9 % IV SOLN
INTRAVENOUS | Status: AC
  Filled 2017-10-30: qty 1.2

## 2017-10-30 MED ORDER — OXYCODONE HCL 5 MG PO TABS: 5.0000 mg | ORAL_TABLET | ORAL | 0 refills | Status: DC | PRN

## 2017-10-30 MED ORDER — LIDOCAINE HCL (PF) 1 % IJ SOLN
INTRAMUSCULAR | Status: AC
Start: 2017-10-30 — End: ?
  Filled 2017-10-30: qty 30

## 2017-10-30 MED ORDER — PROTAMINE SULFATE 10 MG/ML IV SOLN
INTRAVENOUS | Status: AC
  Filled 2017-10-30: qty 25

## 2017-10-30 MED ORDER — ONDANSETRON HCL 4 MG/2ML IJ SOLN
INTRAMUSCULAR | Status: AC
  Filled 2017-10-30: qty 2

## 2017-10-30 MED ORDER — ONDANSETRON HCL 4 MG/2ML IJ SOLN: 4.0000 mg | Freq: Once | INTRAMUSCULAR | Status: DC | PRN

## 2017-10-30 MED ORDER — FENTANYL CITRATE (PF) 100 MCG/2ML IJ SOLN: 25.0000 ug | INTRAMUSCULAR | Status: DC | PRN

## 2017-10-30 MED ORDER — SODIUM CHLORIDE 0.9 % IV SOLN
INTRAVENOUS | Status: DC | PRN
  Administered 2017-10-30: 07:00:00

## 2017-10-30 MED ORDER — OXYCODONE HCL 5 MG PO TABS: 5.0000 mg | ORAL_TABLET | Freq: Once | ORAL | Status: DC | PRN

## 2017-10-30 MED ORDER — DEXAMETHASONE SODIUM PHOSPHATE 10 MG/ML IJ SOLN
INTRAMUSCULAR | Status: AC
  Filled 2017-10-30: qty 1

## 2017-10-30 MED ORDER — SODIUM CHLORIDE 0.9 % IV SOLN
INTRAVENOUS | Status: DC
  Administered 2017-10-30 (×2): via INTRAVENOUS

## 2017-10-30 MED ORDER — LIDOCAINE 2% (20 MG/ML) 5 ML SYRINGE
INTRAMUSCULAR | Status: AC
  Filled 2017-10-30: qty 5

## 2017-10-30 MED ORDER — THROMBIN 5000 UNITS EX SOLR
CUTANEOUS | Status: AC
  Filled 2017-10-30: qty 20000

## 2017-10-30 SURGICAL SUPPLY — 40 items
ADH SKN CLS APL DERMABOND .7 (GAUZE/BANDAGES/DRESSINGS) ×1
ARMBAND PINK RESTRICT EXTREMIT (MISCELLANEOUS) ×6 IMPLANT
BANDAGE ACE 4X5 VEL STRL LF (GAUZE/BANDAGES/DRESSINGS) ×2 IMPLANT
CANISTER SUCT 3000ML PPV (MISCELLANEOUS) ×3 IMPLANT
CANNULA VESSEL 3MM 2 BLNT TIP (CANNULA) ×3 IMPLANT
CLIP VESOCCLUDE MED 6/CT (CLIP) ×3 IMPLANT
CLIP VESOCCLUDE SM WIDE 6/CT (CLIP) ×5 IMPLANT
DECANTER SPIKE VIAL GLASS SM (MISCELLANEOUS) ×3 IMPLANT
DERMABOND ADVANCED (GAUZE/BANDAGES/DRESSINGS) ×2
DERMABOND ADVANCED .7 DNX12 (GAUZE/BANDAGES/DRESSINGS) ×1 IMPLANT
ELECT REM PT RETURN 9FT ADLT (ELECTROSURGICAL) ×3
ELECTRODE REM PT RTRN 9FT ADLT (ELECTROSURGICAL) ×1 IMPLANT
GLOVE BIO SURGEON STRL SZ 6.5 (GLOVE) ×1 IMPLANT
GLOVE BIO SURGEON STRL SZ7.5 (GLOVE) ×5 IMPLANT
GLOVE BIO SURGEONS STRL SZ 6.5 (GLOVE) ×1
GLOVE BIOGEL PI IND STRL 6.5 (GLOVE) IMPLANT
GLOVE BIOGEL PI IND STRL 7.0 (GLOVE) IMPLANT
GLOVE BIOGEL PI IND STRL 8 (GLOVE) ×1 IMPLANT
GLOVE BIOGEL PI INDICATOR 6.5 (GLOVE) ×4
GLOVE BIOGEL PI INDICATOR 7.0 (GLOVE) ×2
GLOVE BIOGEL PI INDICATOR 8 (GLOVE) ×2
GLOVE ECLIPSE 6.5 STRL STRAW (GLOVE) ×4 IMPLANT
GOWN STRL REUS W/ TWL LRG LVL3 (GOWN DISPOSABLE) ×3 IMPLANT
GOWN STRL REUS W/ TWL XL LVL3 (GOWN DISPOSABLE) IMPLANT
GOWN STRL REUS W/TWL LRG LVL3 (GOWN DISPOSABLE) ×12
GOWN STRL REUS W/TWL XL LVL3 (GOWN DISPOSABLE) ×3
GRAFT GORETEX STRT 4-7X45 (Vascular Products) ×2 IMPLANT
KIT BASIN OR (CUSTOM PROCEDURE TRAY) ×3 IMPLANT
KIT TURNOVER KIT B (KITS) ×3 IMPLANT
NS IRRIG 1000ML POUR BTL (IV SOLUTION) ×3 IMPLANT
PACK CV ACCESS (CUSTOM PROCEDURE TRAY) ×3 IMPLANT
PAD ARMBOARD 7.5X6 YLW CONV (MISCELLANEOUS) ×6 IMPLANT
SPONGE SURGIFOAM ABS GEL 100 (HEMOSTASIS) IMPLANT
SUT PROLENE 6 0 BV (SUTURE) ×10 IMPLANT
SUT VIC AB 3-0 SH 27 (SUTURE) ×3
SUT VIC AB 3-0 SH 27X BRD (SUTURE) ×2 IMPLANT
SUT VICRYL 4-0 PS2 18IN ABS (SUTURE) ×4 IMPLANT
TOWEL GREEN STERILE (TOWEL DISPOSABLE) ×3 IMPLANT
UNDERPAD 30X30 (UNDERPADS AND DIAPERS) ×3 IMPLANT
WATER STERILE IRR 1000ML POUR (IV SOLUTION) ×3 IMPLANT

## 2017-10-30 NOTE — Anesthesia Preprocedure Evaluation (Signed)
Anesthesia Evaluation  Patient identified by MRN, date of birth, ID band Patient awake    Reviewed: Allergy & Precautions, NPO status , Patient's Chart, lab work & pertinent test results  Airway Mallampati: II  TM Distance: >3 FB Neck ROM: Full    Dental   Pulmonary former smoker,    breath sounds clear to auscultation       Cardiovascular hypertension,  Rhythm:Regular Rate:Normal     Neuro/Psych    GI/Hepatic   Endo/Other  diabetes  Renal/GU      Musculoskeletal   Abdominal   Peds  Hematology   Anesthesia Other Findings   Reproductive/Obstetrics                             Anesthesia Physical Anesthesia Plan  ASA: III  Anesthesia Plan: General   Post-op Pain Management:    Induction: Intravenous  PONV Risk Score and Plan: Ondansetron  Airway Management Planned: LMA  Additional Equipment:   Intra-op Plan:   Post-operative Plan:   Informed Consent: I have reviewed the patients History and Physical, chart, labs and discussed the procedure including the risks, benefits and alternatives for the proposed anesthesia with the patient or authorized representative who has indicated his/her understanding and acceptance.     Plan Discussed with: CRNA and Anesthesiologist  Anesthesia Plan Comments:         Anesthesia Quick Evaluation

## 2017-10-30 NOTE — Transfer of Care (Signed)
Immediate Anesthesia Transfer of Care Note  Patient: Wayne [name]  Procedure(s) Performed: INSERTION OF 4-22mm x 45cm ARTERIOVENOUS (AV) GORE-TEX GRAFT ARM LEFT UPPER EXTREMITY (Left Arm Upper)  Patient Location: PACU  Anesthesia Type:General  Level of Consciousness: awake  Airway & Oxygen Therapy: Patient Spontanous Breathing and Patient connected to nasal cannula oxygen  Post-op Assessment: Report given to RN, Post -op Vital signs reviewed and stable, Patient moving all extremities and Patient moving all extremities X 4  Post vital signs: Reviewed and stable  Last Vitals:  Vitals Value Taken Time  BP 157/70 10/30/2017  9:25 AM  Temp    Pulse 63 10/30/2017  9:26 AM  Resp 11 10/30/2017  9:26 AM  SpO2 100 % 10/30/2017  9:26 AM  Vitals shown include unvalidated device data.  Last Pain:  Vitals:   10/30/17 0636  TempSrc:   PainSc: 0-No pain      Patients Stated Pain Goal: 3 (14/10/30 1314)  Complications: No apparent anesthesia complications

## 2017-10-30 NOTE — Anesthesia Procedure Notes (Signed)
Procedure Name: LMA Insertion Date/Time: 10/30/2017 7:46 AM Performed by: Izora Gala, CRNA Pre-anesthesia Checklist: Patient identified, Emergency Drugs available and Suction available Patient Re-evaluated:Patient Re-evaluated prior to induction Oxygen Delivery Method: Circle system utilized Preoxygenation: Pre-oxygenation with 100% oxygen Induction Type: IV induction Ventilation: Mask ventilation without difficulty LMA Size: 5.0 Placement Confirmation: positive ETCO2 and breath sounds checked- equal and bilateral Dental Injury: Teeth and Oropharynx as per pre-operative assessment

## 2017-10-30 NOTE — Anesthesia Postprocedure Evaluation (Signed)
Anesthesia Post Note  Patient: Wayne [name]  Procedure(s) Performed: INSERTION OF 4-73mm x 45cm ARTERIOVENOUS (AV) GORE-TEX GRAFT ARM LEFT UPPER EXTREMITY (Left Arm Upper)     Patient location during evaluation: PACU Anesthesia Type: General Level of consciousness: awake and alert Pain management: pain level controlled Vital Signs Assessment: post-procedure vital signs reviewed and stable Respiratory status: spontaneous breathing, nonlabored ventilation, respiratory function stable and patient connected to nasal cannula oxygen Cardiovascular status: blood pressure returned to baseline and stable Postop Assessment: no apparent nausea or vomiting Anesthetic complications: no    Last Vitals:  Vitals:   10/30/17 0940 10/30/17 0954  BP: (!) 159/74 (!) 161/68  Pulse: 63 66  Resp: 13 16  Temp:  (!) 36.4 C  SpO2: 100% 97%    Last Pain:  Vitals:   10/30/17 0925  TempSrc:   PainSc: 0-No pain                 Suzy Kugel COKER

## 2017-10-30 NOTE — Interval H&P Note (Signed)
History and Physical Interval Note:  10/30/2017 7:21 AM  Wayne [name]  has presented today for surgery, with the diagnosis of END STAGE RENAL DISEASE FOR HEMODIALYSIS ACCESS  The various methods of treatment have been discussed with the patient and family. After consideration of risks, benefits and other options for treatment, the patient has consented to  Procedure(s): INSERTION OF ARTERIOVENOUS (AV) GORE-TEX GRAFT ARM LEFT UPPER EXTREMITY (Left) as a surgical intervention .  The patient's history has been reviewed, patient examined, no change in status, stable for surgery.  I have reviewed the patient's chart and labs.  Questions were answered to the patient's satisfaction.     Deitra Mayo

## 2017-10-30 NOTE — Op Note (Signed)
    NAME: Wayne [name]    MRN: [id number] DOB: Jul 05, 1950    DATE OF OPERATION: 10/30/2017  PREOP DIAGNOSIS:    End-stage renal disease  POSTOP DIAGNOSIS:    Same  PROCEDURE:    New left upper arm AV graft (4-7 mm PTFE graft)  SURGEON: Judeth Cornfield. Scot Dock, MD, FACS  ASSIST: Arlee Muslim, PA  ANESTHESIA: General  EBL: Minimal  INDICATIONS:    Wayne [name] is a 67 y.o. male who presents for new access.  FINDINGS:   Excellent thrill in upper arm graft.  4.5 mm high brachial vein.  TECHNIQUE:   The patient was taken to the operating room and received a general anesthetic.  The left arm was prepped and draped in the usual sterile fashion.  A longitudinal incision was made just above the antecubital level.  The dissection was carried down to the brachial artery which was dissected free and controlled with a vessel loop.  A separate longitudinal incision was made beneath the axilla and here the high brachial vein was dissected free.  This was a 4.5 mm vein.  A 4-7 mm PTFE graft was tunneled in the upper arm and the patient was then heparinized.  The brachial artery was clamped proximally and distally and a longitudinal arteriotomy was made.  A short segment of the 4 mm into the graft was excised, the graft slightly spatulated and sewn into side to the artery using continuous 6-0 Prolene suture.  The graft was then pulled the appropriate length for anastomosis to the high brachial vein.  The vein was ligated distally and spatulated proximally.  The graft was cut the appropriate length, spatulated and sewn end to end to the vein using continuous 6-0 Prolene suture.  At the completion was an excellent thrill in the graft.  There was a radial and ulnar signal with the Doppler.  The heparin was partially reversed with protamine.  The wound was closed with a deep layer of 3-0 Vicryl and the skin closed with 4-0 Vicryl.  Dermabond was applied.  The patient tolerated the procedure well and was  transferred to the recovery room in stable condition.  All needle and sponge counts were correct.  Deitra Mayo, MD, FACS [facility]  DATE OF DICTATION:   10/30/2017

## 2017-10-31 ENCOUNTER — Encounter (HOSPITAL_COMMUNITY): Payer: Self-pay | Admitting: Vascular Surgery

## 2017-10-31 DIAGNOSIS — N185 Chronic kidney disease, stage 5: Secondary | ICD-10-CM | POA: Diagnosis not present

## 2017-12-05 ENCOUNTER — Other Ambulatory Visit (HOSPITAL_COMMUNITY): Payer: Self-pay | Admitting: Nephrology

## 2017-12-05 DIAGNOSIS — N186 End stage renal disease: Secondary | ICD-10-CM

## 2017-12-13 ENCOUNTER — Ambulatory Visit (HOSPITAL_COMMUNITY)
Admission: RE | Admit: 2017-12-13 | Discharge: 2017-12-13 | Disposition: A | Discharge status: Home / Self Care | Payer: Medicare Other | Source: Ambulatory Visit | Attending: Nephrology | Admitting: Nephrology

## 2017-12-13 ENCOUNTER — Encounter (HOSPITAL_COMMUNITY): Payer: Self-pay | Admitting: Interventional Radiology

## 2017-12-13 DIAGNOSIS — Z4901 Encounter for fitting and adjustment of extracorporeal dialysis catheter: Secondary | ICD-10-CM | POA: Diagnosis present

## 2017-12-13 DIAGNOSIS — N186 End stage renal disease: Secondary | ICD-10-CM | POA: Diagnosis not present

## 2017-12-13 HISTORY — PX: IR REMOVAL TUN CV CATH W/O FL: IMG2289

## 2017-12-13 MED ORDER — LIDOCAINE HCL 1 % IJ SOLN
INTRAMUSCULAR | Status: AC
  Filled 2017-12-13: qty 20

## 2017-12-13 MED ORDER — LIDOCAINE HCL (PF) 1 % IJ SOLN
INTRAMUSCULAR | Status: DC | PRN
  Administered 2017-12-13: 5 mL

## 2017-12-13 MED ORDER — CHLORHEXIDINE GLUCONATE 4 % EX LIQD
CUTANEOUS | Status: AC
  Filled 2017-12-13: qty 15

## 2017-12-13 NOTE — Procedures (Signed)
Successful removal of tunneled HD catheter EBL: None No immediate complications.  Jay Sunita Demond, MD Pager #: [phone]   

## 2018-01-25 ENCOUNTER — Emergency Department (HOSPITAL_COMMUNITY): Payer: Medicare Other

## 2018-01-25 ENCOUNTER — Inpatient Hospital Stay (HOSPITAL_COMMUNITY): Payer: Medicare Other

## 2018-01-25 ENCOUNTER — Encounter (HOSPITAL_COMMUNITY): Payer: Self-pay | Admitting: Emergency Medicine

## 2018-01-25 ENCOUNTER — Inpatient Hospital Stay (HOSPITAL_COMMUNITY)
Admission: EM | Admit: 2018-01-25 | Discharge: 2018-02-15 | DRG: 296 | Disposition: E | Payer: Medicare Other | Attending: Pulmonary Disease | Admitting: Pulmonary Disease

## 2018-01-25 DIAGNOSIS — G253 Myoclonus: Secondary | ICD-10-CM | POA: Diagnosis present

## 2018-01-25 DIAGNOSIS — Z9911 Dependence on respirator [ventilator] status: Secondary | ICD-10-CM

## 2018-01-25 DIAGNOSIS — I469 Cardiac arrest, cause unspecified: Secondary | ICD-10-CM | POA: Diagnosis present

## 2018-01-25 DIAGNOSIS — Z833 Family history of diabetes mellitus: Secondary | ICD-10-CM | POA: Diagnosis not present

## 2018-01-25 DIAGNOSIS — E872 Acidosis: Secondary | ICD-10-CM | POA: Diagnosis present

## 2018-01-25 DIAGNOSIS — D869 Sarcoidosis, unspecified: Secondary | ICD-10-CM | POA: Diagnosis present

## 2018-01-25 DIAGNOSIS — E785 Hyperlipidemia, unspecified: Secondary | ICD-10-CM

## 2018-01-25 DIAGNOSIS — N186 End stage renal disease: Secondary | ICD-10-CM

## 2018-01-25 DIAGNOSIS — Z452 Encounter for adjustment and management of vascular access device: Secondary | ICD-10-CM

## 2018-01-25 DIAGNOSIS — Z7189 Other specified counseling: Secondary | ICD-10-CM

## 2018-01-25 DIAGNOSIS — I10 Essential (primary) hypertension: Secondary | ICD-10-CM | POA: Diagnosis not present

## 2018-01-25 DIAGNOSIS — Z800 Family history of malignant neoplasm of digestive organs: Secondary | ICD-10-CM

## 2018-01-25 DIAGNOSIS — Z515 Encounter for palliative care: Secondary | ICD-10-CM | POA: Diagnosis present

## 2018-01-25 DIAGNOSIS — I5030 Unspecified diastolic (congestive) heart failure: Secondary | ICD-10-CM | POA: Diagnosis not present

## 2018-01-25 DIAGNOSIS — I120 Hypertensive chronic kidney disease with stage 5 chronic kidney disease or end stage renal disease: Secondary | ICD-10-CM | POA: Diagnosis present

## 2018-01-25 DIAGNOSIS — E7800 Pure hypercholesterolemia, unspecified: Secondary | ICD-10-CM | POA: Diagnosis present

## 2018-01-25 DIAGNOSIS — Z9119 Patient's noncompliance with other medical treatment and regimen: Secondary | ICD-10-CM

## 2018-01-25 DIAGNOSIS — T884XXA Failed or difficult intubation, initial encounter: Secondary | ICD-10-CM | POA: Diagnosis present

## 2018-01-25 DIAGNOSIS — E440 Moderate protein-calorie malnutrition: Secondary | ICD-10-CM | POA: Diagnosis present

## 2018-01-25 DIAGNOSIS — N185 Chronic kidney disease, stage 5: Secondary | ICD-10-CM

## 2018-01-25 DIAGNOSIS — I739 Peripheral vascular disease, unspecified: Secondary | ICD-10-CM | POA: Diagnosis present

## 2018-01-25 DIAGNOSIS — Z992 Dependence on renal dialysis: Secondary | ICD-10-CM

## 2018-01-25 DIAGNOSIS — G931 Anoxic brain damage, not elsewhere classified: Secondary | ICD-10-CM | POA: Diagnosis present

## 2018-01-25 DIAGNOSIS — E119 Type 2 diabetes mellitus without complications: Secondary | ICD-10-CM | POA: Diagnosis not present

## 2018-01-25 DIAGNOSIS — Z66 Do not resuscitate: Secondary | ICD-10-CM | POA: Diagnosis present

## 2018-01-25 DIAGNOSIS — Z8674 Personal history of sudden cardiac arrest: Secondary | ICD-10-CM | POA: Diagnosis not present

## 2018-01-25 DIAGNOSIS — Z87891 Personal history of nicotine dependence: Secondary | ICD-10-CM | POA: Diagnosis not present

## 2018-01-25 DIAGNOSIS — Z87442 Personal history of urinary calculi: Secondary | ICD-10-CM

## 2018-01-25 DIAGNOSIS — Z89512 Acquired absence of left leg below knee: Secondary | ICD-10-CM

## 2018-01-25 DIAGNOSIS — E1122 Type 2 diabetes mellitus with diabetic chronic kidney disease: Secondary | ICD-10-CM | POA: Diagnosis present

## 2018-01-25 DIAGNOSIS — E1151 Type 2 diabetes mellitus with diabetic peripheral angiopathy without gangrene: Secondary | ICD-10-CM | POA: Diagnosis present

## 2018-01-25 DIAGNOSIS — Z9289 Personal history of other medical treatment: Secondary | ICD-10-CM

## 2018-01-25 DIAGNOSIS — K219 Gastro-esophageal reflux disease without esophagitis: Secondary | ICD-10-CM | POA: Diagnosis present

## 2018-01-25 DIAGNOSIS — Z7984 Long term (current) use of oral hypoglycemic drugs: Secondary | ICD-10-CM

## 2018-01-25 DIAGNOSIS — D696 Thrombocytopenia, unspecified: Secondary | ICD-10-CM | POA: Diagnosis present

## 2018-01-25 DIAGNOSIS — G936 Cerebral edema: Secondary | ICD-10-CM | POA: Diagnosis present

## 2018-01-25 DIAGNOSIS — J9601 Acute respiratory failure with hypoxia: Secondary | ICD-10-CM | POA: Diagnosis present

## 2018-01-25 DIAGNOSIS — D638 Anemia in other chronic diseases classified elsewhere: Secondary | ICD-10-CM | POA: Diagnosis present

## 2018-01-25 DIAGNOSIS — Z7982 Long term (current) use of aspirin: Secondary | ICD-10-CM

## 2018-01-25 LAB — CBC WITH DIFFERENTIAL/PLATELET
Abs Immature Granulocytes: 0.23 10*3/uL — ABNORMAL HIGH (ref 0.00–0.07)
BASOS ABS: 0 10*3/uL (ref 0.0–0.1)
BASOS PCT: 0 %
Eosinophils Absolute: 0.3 10*3/uL (ref 0.0–0.5)
Eosinophils Relative: 3 %
HCT: 37.8 % — ABNORMAL LOW (ref 39.0–52.0)
Hemoglobin: 11.8 g/dL — ABNORMAL LOW (ref 13.0–17.0)
Immature Granulocytes: 3 %
LYMPHS ABS: 2.1 10*3/uL (ref 0.7–4.0)
LYMPHS PCT: 23 %
MCH: 30.5 pg (ref 26.0–34.0)
MCHC: 31.2 g/dL (ref 30.0–36.0)
MCV: 97.7 fL (ref 80.0–100.0)
MONO ABS: 0.7 10*3/uL (ref 0.1–1.0)
Monocytes Relative: 7 %
NEUTROS ABS: 6.1 10*3/uL (ref 1.7–7.7)
NEUTROS PCT: 64 %
PLATELETS: 177 10*3/uL (ref 150–400)
RBC: 3.87 MIL/uL — AB (ref 4.22–5.81)
RDW: 13.3 % (ref 11.5–15.5)
WBC: 9.4 10*3/uL (ref 4.0–10.5)
nRBC: 0 % (ref 0.0–0.2)

## 2018-01-25 LAB — BASIC METABOLIC PANEL
ANION GAP: 20 — AB (ref 5–15)
ANION GAP: 13 (ref 5–15)
ANION GAP: 14 (ref 5–15)
BUN: 31 mg/dL — ABNORMAL HIGH (ref 8–23)
BUN: 40 mg/dL — ABNORMAL HIGH (ref 8–23)
BUN: 38 mg/dL — ABNORMAL HIGH (ref 8–23)
CO2: 22 mmol/L (ref 22–32)
CO2: 24 mmol/L (ref 22–32)
CO2: 23 mmol/L (ref 22–32)
Calcium: 9.2 mg/dL (ref 8.9–10.3)
Calcium: 8.8 mg/dL — ABNORMAL LOW (ref 8.9–10.3)
Calcium: 8.1 mg/dL — ABNORMAL LOW (ref 8.9–10.3)
Chloride: 103 mmol/L (ref 98–111)
Chloride: 107 mmol/L (ref 98–111)
Chloride: 106 mmol/L (ref 98–111)
Creatinine, Ser: 10.75 mg/dL — ABNORMAL HIGH (ref 0.61–1.24)
Creatinine, Ser: 10.86 mg/dL — ABNORMAL HIGH (ref 0.61–1.24)
Creatinine, Ser: 10.89 mg/dL — ABNORMAL HIGH (ref 0.61–1.24)
GFR, EST AFRICAN AMERICAN: 5 mL/min — AB (ref >60)
GFR, EST AFRICAN AMERICAN: 5 mL/min — AB (ref >60)
GFR, EST AFRICAN AMERICAN: 5 mL/min — AB (ref >60)
GFR, EST NON AFRICAN AMERICAN: 4 mL/min — AB (ref >60)
GFR, EST NON AFRICAN AMERICAN: 4 mL/min — AB (ref >60)
GFR, EST NON AFRICAN AMERICAN: 4 mL/min — AB (ref >60)
GLUCOSE: 156 mg/dL — AB (ref 70–99)
GLUCOSE: 123 mg/dL — AB (ref 70–99)
Glucose, Bld: 142 mg/dL — ABNORMAL HIGH (ref 70–99)
POTASSIUM: 4.2 mmol/L (ref 3.5–5.1)
POTASSIUM: 3.3 mmol/L — AB (ref 3.5–5.1)
POTASSIUM: 4 mmol/L (ref 3.5–5.1)
SODIUM: 145 mmol/L (ref 135–145)
SODIUM: 144 mmol/L (ref 135–145)
SODIUM: 143 mmol/L (ref 135–145)

## 2018-01-25 LAB — TROPONIN I
Troponin I: 0.2 ng/mL (ref <0.03)
Troponin I: 0.54 ng/mL (ref <0.03)
Troponin I: 0.67 ng/mL (ref <0.03)

## 2018-01-25 LAB — POCT I-STAT 3, ART BLOOD GAS (G3+)
ACID-BASE EXCESS: 4 mmol/L — AB (ref 0.0–2.0)
Acid-Base Excess: 6 mmol/L — ABNORMAL HIGH (ref 0.0–2.0)
Acid-Base Excess: 5 mmol/L — ABNORMAL HIGH (ref 0.0–2.0)
Acid-Base Excess: 4 mmol/L — ABNORMAL HIGH (ref 0.0–2.0)
BICARBONATE: 27.8 mmol/L (ref 20.0–28.0)
BICARBONATE: 26.8 mmol/L (ref 20.0–28.0)
BICARBONATE: 25.8 mmol/L (ref 20.0–28.0)
Bicarbonate: 27.2 mmol/L (ref 20.0–28.0)
O2 SAT: 100 %
O2 SAT: 99 %
O2 Saturation: 100 %
O2 Saturation: 98 %
PCO2 ART: 30.3 mmHg — AB (ref 32.0–48.0)
PH ART: 7.547 — AB (ref 7.350–7.450)
PO2 ART: 96 mmHg (ref 83.0–108.0)
PO2 ART: 92 mmHg (ref 83.0–108.0)
Patient temperature: 32.9
TCO2: 29 mmol/L (ref 22–32)
TCO2: 28 mmol/L (ref 22–32)
TCO2: 27 mmol/L (ref 22–32)
TCO2: 28 mmol/L (ref 22–32)
pCO2 arterial: 23.5 mmHg — ABNORMAL LOW (ref 32.0–48.0)
pCO2 arterial: 29.5 mmHg — ABNORMAL LOW (ref 32.0–48.0)
pCO2 arterial: 30 mmHg — ABNORMAL LOW (ref 32.0–48.0)
pH, Arterial: 7.668 (ref 7.350–7.450)
pH, Arterial: 7.567 — ABNORMAL HIGH (ref 7.350–7.450)
pH, Arterial: 7.542 — ABNORMAL HIGH (ref 7.350–7.450)
pO2, Arterial: 112 mmHg — ABNORMAL HIGH (ref 83.0–108.0)
pO2, Arterial: 229 mmHg — ABNORMAL HIGH (ref 83.0–108.0)

## 2018-01-25 LAB — I-STAT CHEM 8, ED
BUN: 35 mg/dL — ABNORMAL HIGH (ref 8–23)
CHLORIDE: 107 mmol/L (ref 98–111)
CREATININE: 11.6 mg/dL — AB (ref 0.61–1.24)
Calcium, Ion: 1.07 mmol/L — ABNORMAL LOW (ref 1.15–1.40)
GLUCOSE: 141 mg/dL — AB (ref 70–99)
HCT: 36 % — ABNORMAL LOW (ref 39.0–52.0)
HEMOGLOBIN: 12.2 g/dL — AB (ref 13.0–17.0)
POTASSIUM: 3.9 mmol/L (ref 3.5–5.1)
Sodium: 143 mmol/L (ref 135–145)
TCO2: 23 mmol/L (ref 22–32)

## 2018-01-25 LAB — COMPREHENSIVE METABOLIC PANEL
ALBUMIN: 3.3 g/dL — AB (ref 3.5–5.0)
ALT: 212 U/L — ABNORMAL HIGH (ref 0–44)
ANION GAP: 20 — AB (ref 5–15)
AST: 204 U/L — AB (ref 15–41)
Alkaline Phosphatase: 87 U/L (ref 38–126)
BILIRUBIN TOTAL: 0.7 mg/dL (ref 0.3–1.2)
BUN: 30 mg/dL — ABNORMAL HIGH (ref 8–23)
CHLORIDE: 104 mmol/L (ref 98–111)
CO2: 21 mmol/L — AB (ref 22–32)
Calcium: 9.4 mg/dL (ref 8.9–10.3)
Creatinine, Ser: 10.9 mg/dL — ABNORMAL HIGH (ref 0.61–1.24)
GFR calc Af Amer: 5 mL/min — ABNORMAL LOW (ref >60)
GFR calc non Af Amer: 4 mL/min — ABNORMAL LOW (ref >60)
GLUCOSE: 147 mg/dL — AB (ref 70–99)
Potassium: 4 mmol/L (ref 3.5–5.1)
SODIUM: 145 mmol/L (ref 135–145)
Total Protein: 7 g/dL (ref 6.5–8.1)

## 2018-01-25 LAB — PROTIME-INR
INR: 1.09
INR: 1.18
INR: 1.25
PROTHROMBIN TIME: 14 s (ref 11.4–15.2)
PROTHROMBIN TIME: 14.9 s (ref 11.4–15.2)
PROTHROMBIN TIME: 15.6 s — AB (ref 11.4–15.2)

## 2018-01-25 LAB — POCT I-STAT 4, (NA,K, GLUC, HGB,HCT)
GLUCOSE: 172 mg/dL — AB (ref 70–99)
GLUCOSE: 160 mg/dL — AB (ref 70–99)
GLUCOSE: 137 mg/dL — AB (ref 70–99)
HCT: 28 % — ABNORMAL LOW (ref 39.0–52.0)
HCT: 32 % — ABNORMAL LOW (ref 39.0–52.0)
HEMATOCRIT: 29 % — AB (ref 39.0–52.0)
HEMOGLOBIN: 10.9 g/dL — AB (ref 13.0–17.0)
Hemoglobin: 9.5 g/dL — ABNORMAL LOW (ref 13.0–17.0)
Hemoglobin: 9.9 g/dL — ABNORMAL LOW (ref 13.0–17.0)
POTASSIUM: 3.5 mmol/L (ref 3.5–5.1)
Potassium: 4.1 mmol/L (ref 3.5–5.1)
Potassium: 3.9 mmol/L (ref 3.5–5.1)
SODIUM: 144 mmol/L (ref 135–145)
SODIUM: 145 mmol/L (ref 135–145)
Sodium: 142 mmol/L (ref 135–145)

## 2018-01-25 LAB — GLUCOSE, CAPILLARY
Glucose-Capillary: 119 mg/dL — ABNORMAL HIGH (ref 70–99)
Glucose-Capillary: 125 mg/dL — ABNORMAL HIGH (ref 70–99)
Glucose-Capillary: 112 mg/dL — ABNORMAL HIGH (ref 70–99)
Glucose-Capillary: 102 mg/dL — ABNORMAL HIGH (ref 70–99)

## 2018-01-25 LAB — POCT I-STAT, CHEM 8
BUN: 34 mg/dL — AB (ref 8–23)
CHLORIDE: 107 mmol/L (ref 98–111)
Calcium, Ion: 1.05 mmol/L — ABNORMAL LOW (ref 1.15–1.40)
Creatinine, Ser: 11.7 mg/dL — ABNORMAL HIGH (ref 0.61–1.24)
GLUCOSE: 135 mg/dL — AB (ref 70–99)
HCT: 32 % — ABNORMAL LOW (ref 39.0–52.0)
Hemoglobin: 10.9 g/dL — ABNORMAL LOW (ref 13.0–17.0)
POTASSIUM: 3.2 mmol/L — AB (ref 3.5–5.1)
Sodium: 144 mmol/L (ref 135–145)
TCO2: 26 mmol/L (ref 22–32)

## 2018-01-25 LAB — HEMOGLOBIN A1C
HEMOGLOBIN A1C: 4.6 % — AB (ref 4.8–5.6)
MEAN PLASMA GLUCOSE: 85.32 mg/dL

## 2018-01-25 LAB — ECHOCARDIOGRAM COMPLETE
HEIGHTINCHES: 73 in
WEIGHTICAEL: 2560 [oz_av]

## 2018-01-25 LAB — HIV ANTIBODY (ROUTINE TESTING W REFLEX): HIV Screen 4th Generation wRfx: NONREACTIVE

## 2018-01-25 LAB — I-STAT TROPONIN, ED: Troponin i, poc: 0.18 ng/mL (ref 0.00–0.08)

## 2018-01-25 LAB — I-STAT CG4 LACTIC ACID, ED: LACTIC ACID, VENOUS: 8.01 mmol/L — AB (ref 0.5–1.9)

## 2018-01-25 LAB — APTT
APTT: 36 s (ref 24–36)
aPTT: 31 seconds (ref 24–36)
aPTT: 33 seconds (ref 24–36)

## 2018-01-25 LAB — MRSA PCR SCREENING: MRSA by PCR: NEGATIVE

## 2018-01-25 MED ORDER — CLEVIDIPINE BUTYRATE 0.5 MG/ML IV EMUL
2.0000 mg/h | INTRAVENOUS | Status: DC
  Administered 2018-01-25: 1 mg/h via INTRAVENOUS
  Filled 2018-01-25 (×3): qty 50

## 2018-01-25 MED ORDER — VALPROATE SODIUM 500 MG/5ML IV SOLN
500.0000 mg | Freq: Three times a day (TID) | INTRAVENOUS | Status: DC
  Administered 2018-01-25 – 2018-01-26 (×4): 500 mg via INTRAVENOUS
  Filled 2018-01-25 (×6): qty 5

## 2018-01-25 MED ORDER — CHLORHEXIDINE GLUCONATE 0.12% ORAL RINSE (MEDLINE KIT)
15.0000 mL | Freq: Two times a day (BID) | OROMUCOSAL | Status: DC
  Administered 2018-01-25 – 2018-01-29 (×8): 15 mL via OROMUCOSAL

## 2018-01-25 MED ORDER — NOREPINEPHRINE 4 MG/250ML-% IV SOLN
0.0000 ug/min | INTRAVENOUS | Status: DC
  Administered 2018-01-26: 4 ug/min via INTRAVENOUS
  Administered 2018-01-26: 1 ug/min via INTRAVENOUS
  Administered 2018-01-27 (×2): 4 ug/min via INTRAVENOUS
  Filled 2018-01-25 (×3): qty 250

## 2018-01-25 MED ORDER — PROPOFOL 1000 MG/100ML IV EMUL
INTRAVENOUS | Status: AC
  Filled 2018-01-25: qty 100

## 2018-01-25 MED ORDER — POTASSIUM CHLORIDE 10 MEQ/50ML IV SOLN
INTRAVENOUS | Status: AC
  Administered 2018-01-25: 10 meq
  Filled 2018-01-25: qty 50

## 2018-01-25 MED ORDER — VALPROATE SODIUM 500 MG/5ML IV SOLN
500.0000 mg | Freq: Three times a day (TID) | INTRAVENOUS | Status: DC
  Filled 2018-01-25 (×2): qty 5

## 2018-01-25 MED ORDER — FENTANYL CITRATE (PF) 100 MCG/2ML IJ SOLN: 50.0000 ug | Freq: Once | INTRAMUSCULAR | Status: DC

## 2018-01-25 MED ORDER — SODIUM CHLORIDE 0.9 % IV SOLN
1.0000 ug/kg/min | INTRAVENOUS | Status: DC
  Administered 2018-01-25: 1 ug/kg/min via INTRAVENOUS
  Administered 2018-01-26: 1.5 ug/kg/min via INTRAVENOUS
  Filled 2018-01-25 (×2): qty 20

## 2018-01-25 MED ORDER — FENTANYL 2500MCG IN NS 250ML (10MCG/ML) PREMIX INFUSION
100.0000 ug/h | INTRAVENOUS | Status: DC
  Administered 2018-01-25 – 2018-01-26 (×2): 100 ug/h via INTRAVENOUS
  Administered 2018-01-26: 250 ug/h via INTRAVENOUS
  Filled 2018-01-25 (×3): qty 250

## 2018-01-25 MED ORDER — PROPOFOL 1000 MG/100ML IV EMUL
25.0000 ug/kg/min | INTRAVENOUS | Status: DC
  Administered 2018-01-25 (×2): 25 ug/kg/min via INTRAVENOUS
  Administered 2018-01-25: 30 ug/kg/min via INTRAVENOUS
  Administered 2018-01-26: 60 ug/kg/min via INTRAVENOUS
  Administered 2018-01-26: 30 ug/kg/min via INTRAVENOUS
  Administered 2018-01-26: 65 ug/kg/min via INTRAVENOUS
  Administered 2018-01-26: 30 ug/kg/min via INTRAVENOUS
  Administered 2018-01-27: 50 ug/kg/min via INTRAVENOUS
  Administered 2018-01-27 (×3): 60 ug/kg/min via INTRAVENOUS
  Administered 2018-01-27: 65 ug/kg/min via INTRAVENOUS
  Administered 2018-01-27: 50 ug/kg/min via INTRAVENOUS
  Administered 2018-01-28 (×3): 60 ug/kg/min via INTRAVENOUS
  Administered 2018-01-28: 80 ug/kg/min via INTRAVENOUS
  Administered 2018-01-28: 60 ug/kg/min via INTRAVENOUS
  Administered 2018-01-29 (×2): 70 ug/kg/min via INTRAVENOUS
  Filled 2018-01-25 (×2): qty 100
  Filled 2018-01-25: qty 200
  Filled 2018-01-25 (×4): qty 100
  Filled 2018-01-25: qty 200
  Filled 2018-01-25 (×10): qty 100

## 2018-01-25 MED ORDER — INSULIN ASPART 100 UNIT/ML ~~LOC~~ SOLN
0.0000 [IU] | SUBCUTANEOUS | Status: DC
  Administered 2018-01-25: 2 [IU] via SUBCUTANEOUS
  Administered 2018-01-25: 1 [IU] via SUBCUTANEOUS
  Administered 2018-01-28: 2 [IU] via SUBCUTANEOUS
  Administered 2018-01-28 – 2018-01-29 (×2): 1 [IU] via SUBCUTANEOUS
  Administered 2018-01-29: 2 [IU] via SUBCUTANEOUS

## 2018-01-25 MED ORDER — CISATRACURIUM BOLUS VIA INFUSION
0.1000 mg/kg | Freq: Once | INTRAVENOUS | Status: AC
  Administered 2018-01-25: 7.3 mg via INTRAVENOUS
  Filled 2018-01-25: qty 8

## 2018-01-25 MED ORDER — ASPIRIN 300 MG RE SUPP
300.0000 mg | RECTAL | Status: AC
  Administered 2018-01-25: 300 mg via RECTAL
  Filled 2018-01-25: qty 1

## 2018-01-25 MED ORDER — FAMOTIDINE IN NACL 20-0.9 MG/50ML-% IV SOLN
20.0000 mg | Freq: Two times a day (BID) | INTRAVENOUS | Status: DC
  Administered 2018-01-25 – 2018-01-28 (×7): 20 mg via INTRAVENOUS
  Filled 2018-01-25 (×7): qty 50

## 2018-01-25 MED ORDER — VALPROATE SODIUM 500 MG/5ML IV SOLN
1000.0000 mg | Freq: Once | INTRAVENOUS | Status: AC
  Administered 2018-01-25: 1000 mg via INTRAVENOUS
  Filled 2018-01-25: qty 10

## 2018-01-25 MED ORDER — ARTIFICIAL TEARS OPHTHALMIC OINT
1.0000 "application " | TOPICAL_OINTMENT | Freq: Three times a day (TID) | OPHTHALMIC | Status: DC
  Administered 2018-01-25 – 2018-01-28 (×9): 1 via OPHTHALMIC
  Filled 2018-01-25 (×2): qty 3.5

## 2018-01-25 MED ORDER — SODIUM CHLORIDE 0.9 % IV SOLN
INTRAVENOUS | Status: DC
  Administered 2018-01-25 – 2018-01-27 (×3): via INTRAVENOUS

## 2018-01-25 MED ORDER — HEPARIN SODIUM (PORCINE) 5000 UNIT/ML IJ SOLN
5000.0000 [IU] | Freq: Two times a day (BID) | INTRAMUSCULAR | Status: DC
  Administered 2018-01-25 – 2018-01-29 (×8): 5000 [IU] via SUBCUTANEOUS
  Filled 2018-01-25 (×8): qty 1

## 2018-01-25 MED ORDER — ORAL CARE MOUTH RINSE
15.0000 mL | OROMUCOSAL | Status: DC
  Administered 2018-01-25 – 2018-01-29 (×39): 15 mL via OROMUCOSAL

## 2018-01-25 MED ORDER — FENTANYL BOLUS VIA INFUSION
25.0000 ug | INTRAVENOUS | Status: DC | PRN
  Filled 2018-01-25: qty 25

## 2018-01-25 MED ORDER — CISATRACURIUM BOLUS VIA INFUSION
0.0500 mg/kg | INTRAVENOUS | Status: DC | PRN
  Filled 2018-01-25: qty 4

## 2018-01-25 NOTE — Progress Notes (Signed)
RT transported to CT and returned to trauma C. No complications. Vitals stable at this time. Patient tolerated well. RT will continue to monitor.

## 2018-01-25 NOTE — Procedures (Signed)
Central venous catheter placement  Indication: A central venous catheter was placed for infusion of vasoactive drugs in a patient who is status post PEA arrest.  Procedure: A timeout was performed in the area over the left subclavian was extensively prepped with chlorhexidine.  The area was widely draped in sterile garb was donned.  The vein was easily cannulated and a wire gently passed.  Skin was sharply incised and the tract dilated.  A 20 cm 7 French triple-lumen catheter was placed over the wire.  There was good flow from all ports.  The catheter was sutured in place at 19 cm.  Chest x-ray shows good line placement and no pneumothorax.

## 2018-01-25 NOTE — ED Triage Notes (Addendum)
Pt arrives via [facility]. Ems reports pt arrived to dialysis this am, was not feeling well, walked into [facility] and had witnessed arrest. cpr started immediately, pt PEA on monitor, pt given epi x2, sodium bicarb x1, calcium x1, ROSC obtained after 18 minutes of compressions. CBG 160, bp 144/71. ETT in place upon arrival. I/O in place to R tib/fib. No purposeful movements noted at this time.

## 2018-01-25 NOTE — Procedures (Signed)
ELECTROENCEPHALOGRAM REPORT   Patient: Wayne [name]       Room #: 2H17C EEG No. ID: [id number] Age: 67 y.o.        Sex: male Referring Physician: Agarwala Report Date:  01/16/2018        Interpreting Physician: Alexis Goodell  History: Wayne [name] is an 67 y.o. male s/p arrest  Medications:  Pepcid, Fentanyl, Insulin, Diprovan, Depacon, Nimbex, Levophed  Conditions of Recording:  This is a 21 channel routine scalp EEG performed with bipolar and monopolar montages arranged in accordance to the international 10/20 system of electrode placement. One channel was dedicated to EKG recording.  The patient is in the intubated, sedated and paralyzed state.  Description:  The background activity is discontinuous. It consists of bursts of polyspike, spike and slow wave activity alternating with periods of attenuation. The burst activity lasts from one to three seconds. The periods of attenuation last up to 35 seconds but typically are less than 15 seconds.  Some twitching activity is noted by the technician but unable to be seen on camera and at times appears to correspond with the burst activity per notation.  This discontinuous activity is maintained throughout the tracing.   Hyperventilation and intermittent photic stimulation were not performed.  No activation procedures were performed.     IMPRESSION: This is an abnormal electroencephalogram secondary to a burst suppression rhythm that is persistent throughout the recording.     Alexis Goodell, MD Neurology [phone] 01/19/2018, 12:21 PM

## 2018-01-25 NOTE — Progress Notes (Signed)
  Echocardiogram 2D Echocardiogram has been performed.  Wayne [name] Wayne [name] 02/02/2018, 11:05 AM

## 2018-01-25 NOTE — Consult Note (Addendum)
NEURO HOSPITALIST CONSULT NOTE   Requestig physician: Dr. Marolyn Hammock   Reason for Consult: subclinical seizures vs myoclonic jerking   History obtained from:  chart  HPI:                                                                                                                                          Wayne [name] is an 67 y.o. male  With PMH significant for  DM2, PVD, HLD, HTN,  ESRD who had a PEA arrest on his way to dialysis. CPR initiated and patient intubated in the field and transported to [facility]. Unclear downtime based on H&P review. Placed on hypothermia protocol and exhibited myoclonic jerking upon arrival prior to initiation of hypothermia protocol requiring paralytics for cessation of the myoclonic jerking. No family at bedside to provide history   Hospital course: hgb A1c: 4.6, troponin 0.18 creatinine: 10.75, GFR: 5 ABG: PH: 7.668, bicarb: 27.8 C02: 23.5 Past Medical History:  Diagnosis Date  . Anemia of chronic disease   . ESRD (end stage renal disease) ([place])    [name]  . Essential hypertension   . GERD (gastroesophageal reflux disease)     not current  . History of kidney stones    passed  . Hypercholesteremia   . Peripheral vascular disease ([place])   . Sarcoidosis   . Type 2 diabetes mellitus ([place])    Type II    Past Surgical History:  Procedure Laterality Date  . 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 AV FISTULA PLACEMENT Left 12/31/2012   Procedure: ARTERIOVENOUS (AV) FISTULA CREATION-LEFT;  Surgeon: Angelia Mould, MD;  Location: [place];  Service: Vascular;  Laterality: Left;  . AV FISTULA PLACEMENT Right 11/03/2014   Procedure: INSERTION OF RIGHT ARM  ARTERIOVENOUS (AV) GORE-TEX GRAFT;  Surgeon: Angelia Mould, MD;  Location: [place];  Service: Vascular;  Laterality: Right;  . AV FISTULA PLACEMENT Left 10/30/2017   Procedure: INSERTION OF 4-83mm x 45cm  ARTERIOVENOUS (AV) GORE-TEX GRAFT ARM LEFT UPPER EXTREMITY;  Surgeon: Angelia Mould, MD;  Location: [place];  Service: Vascular;  Laterality: Left;  . BELOW KNEE LEG AMPUTATION Left   . COLONOSCOPY    . COLONOSCOPY N/A 02/22/2017   Procedure: COLONOSCOPY;  Surgeon: Rogene [place], MD;  [facility];  Service: Endoscopy;  Laterality: N/A;  830  . ESOPHAGEAL DILATION     8-10 yrs ago   . EYE SURGERY Right   . FEMORAL ARTERY EXPLORATION Right 08/28/2017   Procedure: Exploration of Right Axillary Vein;  Surgeon: Elam Dutch, MD;  Location: [facility];  Service: Vascular;  Laterality: Right;  . FISTULOGRAM N/A 07/27/2014   Procedure: FISTULOGRAM;  Surgeon:  Angelia Mould, MD;  Location: [facility] CATH LAB;  Service: Cardiovascular;  Laterality: N/A;  . IR FLUORO GUIDE CV LINE RIGHT  07/10/2017  . IR FLUORO GUIDE CV LINE RIGHT  07/11/2017  . IR REMOVAL TUN CV CATH W/O FL  12/13/2017  . IR US GUIDE VASC ACCESS RIGHT  07/10/2017  . UPPER EXTREMITY VENOGRAPHY Right 08/21/2017   Procedure: UPPER EXTREMITY VENOGRAPHY;  Surgeon: Serafina Mitchell, MD;  Location: [place] CV LAB;  Service: Cardiovascular;  Laterality: Right;  . UPPER EXTREMITY VENOGRAPHY N/A 10/11/2017   Procedure: UPPER EXTREMITY VENOGRAPHY;  Surgeon: Conrad Valparaiso, MD;  Location: [place] CV LAB;  Service: Cardiovascular;  Laterality: N/A;    Family History  Problem Relation Age of Onset  . Diabetes Mellitus II Mother   . Diabetes Mother   . Colon cancer Mother           Social History:  reports that he quit smoking about 19 years ago. His smoking use included cigarettes. He quit after 20.00 years of use. He has never used smokeless tobacco. He reports that he does not drink alcohol or use drugs.  No Known Allergies  MEDICATIONS:                                                                                                                     Scheduled: . artificial tears  1 application Both Eyes D5H  .  fentaNYL (SUBLIMAZE) injection  50 mcg Intravenous Once  . heparin injection (subcutaneous)  5,000 Units Subcutaneous Q12H  . insulin aspart  0-9 Units Subcutaneous Q4H   Continuous: . sodium chloride 50 mL/hr at 01/15/2018 1600  . cisatracurium (NIMBEX) infusion 1.0809 mcg/kg/min (01/24/2018 1600)  . clevidipine 1 mg/hr (02/11/2018 1600)  . famotidine (PEPCID) IV Stopped (01/26/2018 1250)  . fentaNYL infusion INTRAVENOUS 100 mcg/hr (02/01/2018 1600)  . norepinephrine (LEVOPHED) Adult infusion    . potassium chloride    . propofol (DIPRIVAN) infusion 25 mcg/kg/min (01/28/2018 1600)  . propofol    . valproate sodium     PRN:[COMPLETED] cisatracurium **AND** cisatracurium (NIMBEX) infusion **AND** cisatracurium, fentaNYL ROS:                                                                                                                                        unobtainable from patient due to mental status/intubated  Blood pressure (!) 154/63, pulse (!) 55, temperature (!)  91.4 F (33 C), resp. rate 10, height 6\' 1"  (1.854 m), weight 72.6 kg, SpO2 100 %. General Examination:                                                                                             Physical Exam  General: Well-developed well-nourished in no apparent distress HEENT: Normocephalic atraumatic CVS: G2-X5 heard regular rate rhythm Respiratory: Ventilated, chest clear to auscultation Abdomen: Nondistended nontender  Neurological Examination Patient currently on fentanyl, propofol and Nimbex. No spontaneous movements Breathing with the ventilator Pupils are 2 mm and nearly unreactive-patient is on paralytics No corneal reflexes No doll's eye No cough or gag No movement to noxious stim    Lab Results: Basic Metabolic Panel: Recent Labs  Lab 01/21/2018 0711 02/12/2018 0730 02/01/2018 0817 01/20/2018 1124 02/10/2018 1322 01/16/2018 1436  NA 145 143 145 142 144 144  K 4.0 3.9 4.2 4.1 3.5 3.3*  CL 104 107 103  --   --   107  CO2 21*  --  22  --   --  24  GLUCOSE 147* 141* 156* 172* 160* 142*  BUN 30* 35* 31*  --   --  40*  CREATININE 10.90* 11.60* 10.75*  --   --  10.86*  CALCIUM 9.4  --  9.2  --   --  8.8*    CBC: Recent Labs  Lab 02/01/2018 0711 01/26/2018 0730 01/27/2018 1124 01/15/2018 1322  WBC 9.4  --   --   --   NEUTROABS 6.1  --   --   --   HGB 11.8* 12.2* 9.5* 10.9*  HCT 37.8* 36.0* 28.0* 32.0*  MCV 97.7  --   --   --   PLT 177  --   --   --     Cardiac Enzymes: Recent Labs  Lab 01/20/2018 0817 01/17/2018 1436  TROPONINI 0.20* 0.54*   EEG shows burst suppression pattern.  Imaging: Noncontrast CT of the head shows no acute abnormality.  Chronic microvascular changes seen.  Assessment: Wayne [name] is an 67 y.o. male  With PMH significant for  DM2, PVD, HLD, HTN,  ESRD who had a PEA arrest on his way to dialysis. CPR initiated and patient intubated in the field and transported to [facility].  EEG concerning for burst suppression but there was clinical concern for myoclonus, hence neurological consultation was obtained. Currently on hypothermia protocol.  Patient is presenting with myoclonus at the time of presentation in the setting of a cardiac arrest and an EEG showing burst suppression so early in the course after cardiac arrest -  both portend poor prognosis, but at this time, he is chemically sedated and on hypothermia protocol in any sort of prognostication is not feasible.  Impression: Evaluate for hypoxic/anoxic brain injury  Recommendations: Continue long-term EEG He will need an off sedation exam 72 hours after he is rewarmed If myoclonic jerking occurs-can load with Keppra or Depakote. Klonopin is a good agent to add if benzos are required for the control of the myoclonic jerking. We will follow with you  Laurey Morale, MSN, NP-C [facility] [phone]  Attending neurologist's note to follow   01/27/2018, 4:54 PM  Attending Neurohospitalist  Addendum Patient seen and examined with APP.Agree with the history and physical as documented above. Agree with the plan as documented, which I helped formulate and I have made edits in the notes as needed. I have independently reviewed the chart, obtained history, review of systems and examined the patient.I have personally reviewed pertinent head/neck/spine imaging (CT/MRI).  CT head with no acute changes Please feel free to call with any questions. --- Amie Portland, MD [facility] Pager: [phone]  If 7pm to 7am, please call on call as listed on AMION.

## 2018-01-25 NOTE — Progress Notes (Signed)
Initial Nutrition Assessment  DOCUMENTATION CODES:   Non-severe (moderate) malnutrition in context of chronic illness  INTERVENTION:   Once pt rewarmed, recommend intiation of TF via OG tube: - Vital AF 1.2 @ 40 ml/hr (960 ml/day) - Pro-stat 30 ml BID  Recommended tube feeding regimen provides 1352 kcal, 102 grams of protein, and 778 ml of H2O.   Recommended tube feeding regimen and current propofol and cleviprex provides 1736 total kcal (103% of needs).  NUTRITION DIAGNOSIS:   Moderate Malnutrition related to chronic illness (ESRD on HD) as evidenced by mild fat depletion, moderate fat depletion, mild muscle depletion, moderate muscle depletion.  GOAL:   Patient will meet greater than or equal to 90% of their needs  MONITOR:   Vent status, Labs, Weight trends, Skin, I & O's  REASON FOR ASSESSMENT:   Ventilator    ASSESSMENT:   67 year old male who presented to the ED on 10/11 s/p PEA arrest. Pt had immediate CPR for 18 minutes and required intubation. PMH significant for ESRD on HD, anemia, hypertension, GERD, PVD s/p left BKA, and type 2 diabetes mellitus.  Noted pt is now on the hypothermia protocol. EEG shows burst suppression pattern.  Discussed pt with RN in room at time of visit. Per RN, rewarming to start tomorrow.  No family present at time of visit so unable to obtain a detailed diet and weight history. NFPE limited due to [facility].  Per weight history in chart, pt has experienced recent weight fluctuations. Unsure of pt's EDW. Also, pt likely fluid overloaded at this time due to missing HD. Will follow for weight trends during admission and attempt to obtain weight history at follow-up.  Wt Readings from Last 10 Encounters:  02/11/2018 72.6 kg  10/30/17 72.6 kg  10/11/17 72.6 kg  10/04/17 72.1 kg  08/28/17 78 kg  08/21/17 78 kg  08/16/17 71.7 kg  07/11/17 67 kg  06/30/17 71.7 kg  03/14/17 78 kg   Patient is currently intubated on ventilator support.  OG tube in stomach to low intermittent suction. MVe: 7.1 L/min Temp (24hrs), Avg:91.2 F (32.9 C), Min:90.7 F (32.6 C), Max:91.6 F (33.1 C) BP: 151/60 MAP: 87  Propofol: 10.9 ml/hr (provides 288 kcal/day) Cleviprex: 2 ml/hr (provides 96 kcal/day) Nimbex: 4.3 ml/hr Fentanyl: 10 ml/hr IVF: NaCl @ 50 ml/hr  Medications reviewed and include: sliding scale Novolog q 4 hours, IV Pepcid 20 mg q 12 hours, IV Depacon 500 mg q 8 hours  Labs reviewed: hemoglobin 10.9 (L), HCT 32.0 (L), BUN 31 (H), creatinine 10.75 (H) CBG: 119  UOP: 43 ml since admit NG output: 150 ml since admit  NUTRITION - FOCUSED PHYSICAL EXAM:    Most Recent Value  Orbital Region  Moderate depletion  Upper Arm Region  Moderate depletion  Thoracic and Lumbar Region  Unable to assess [Arctic Sun]  Buccal Region  Mild depletion  Temple Region  Mild depletion  Clavicle Bone Region  Moderate depletion  Clavicle and Acromion Bone Region  Moderate depletion  Scapular Bone Region  Unable to assess  Dorsal Hand  Mild depletion  Patellar Region  Moderate depletion  Anterior Thigh Region  Unable to assess  Posterior Calf Region  Moderate depletion  Edema (RD Assessment)  Moderate [generalized]  Hair  Reviewed  Eyes  Unable to assess  Mouth  Unable to assess  Skin  Reviewed  Nails  Reviewed       Diet Order:   Diet Order  Diet NPO time specified  Diet effective now              EDUCATION NEEDS:   Not appropriate for education at this time  Skin:  Skin Assessment: Reviewed RN Assessment  Last BM:  10/11 - type 7  Height:   Ht Readings from Last 1 Encounters:  02/01/2018 6\' 1"  (1.854 m)    Weight:   Wt Readings from Last 1 Encounters:  01/30/2018 72.6 kg  Unsure of pt's dry weight at this time.  Ideal Body Weight:  78.2 kg  BMI:  Body mass index is 21.11 kg/m.  Estimated Nutritional Needs:   Kcal:  9179  Protein:  100-115 grams  Fluid:  UOP + 1000 ml    Gaynell Face, MS, RD, LDN Inpatient Clinical Dietitian Pager: [phone] Weekend/After Hours: [phone]

## 2018-01-25 NOTE — Procedures (Signed)
Arterial line placement  Indication: Frequent blood gases and continuous blood pressure monitoring in a patient status post PEA arrest  Procedure: A timeout was performed in the area over the left femoral artery prepped with chlorhexidine.  The area was draped and under sterile procedures the left femoral artery was easily cannulated using anatomical landmarks.  A wire was gently passed through the needle, the skin was sharply incised and a 20-gauge femoral arterial catheter was placed.  There was good flow from the catheter.  The catheter was sutured in place there was a good tracing on the monitor.  There were no immediate complications.

## 2018-01-25 NOTE — ED Notes (Signed)
Pt transporting to [place] with RT.

## 2018-01-25 NOTE — Progress Notes (Signed)
vLTM EEG running. No skin breakdown. Notified Neuro 

## 2018-01-25 NOTE — Progress Notes (Signed)
Patient transported to 2H17. No complications. Vitals stable at this time. Unit RT aware. RT will continue to monitor.

## 2018-01-25 NOTE — ED Notes (Signed)
Called [facility] for code cool

## 2018-01-25 NOTE — H&P (Signed)
PULMONARY / CRITICAL CARE MEDICINE   NAME:  Wayne [name], MRN:  [id number], DOB:  02-17-1951, LOS: 0 ADMISSION DATE:  02/03/2018, REFERRING MD: Emergency department physician, CHIEF COMPLAINT: Status post PEA arrest 02/24/2018  BRIEF HISTORY:    Status post PEA arrest myoclonic jerks been placed on hypothermia protocol HISTORY OF PRESENT ILLNESS   67 year old male with known end-stage renal disease who has a past medical history of end-stage renal disease, diabetes mellitus, poor compliance who was on his way to dialysis when he felt funny and vomited and had a PEA arrest.  CPR started almost immediately he was intubated in the field transported to [facility] and noted to have myoclonic jerks once he was stabilized.  Pulmonary critical care asked to admit. SIGNIFICANT PAST MEDICAL HISTORY   End-stage renal disease  DM Non compliance PVD    SIGNIFICANT EVENTS:  11/10 pea arrest STUDIES:   11/10 ct head>> CULTURES:    ANTIBIOTICS:    LINES/TUBES:   11/10 ett>> CONSULTANTS:  10-11 renal SUBJECTIVE:  67 year old post cardiac arrest yesterday myoclonic jerks.  CONSTITUTIONAL: BP (!) 172/76   Pulse (!) 58   Resp (!) 29   Ht 6\' 1"  (1.854 m)   Wt 72.6 kg   SpO2 100%   BMI 21.11 kg/m   No intake/output data recorded.     Vent Mode: PRVC FiO2 (%):  [100 %] 100 % Set Rate:  [15 bmp] 15 bmp Vt Set:  [630 mL] 630 mL PEEP:  [5 cmH20] 5 cmH20  PHYSICAL EXAM: General: Thin male who status post cardiac arrest  Neuro: Myoclonic jerks noted, withdrawal to pain HEENT: Endotracheal tube in place, missing front tooth upper Cardiovascular: Heart sounds are regular Lungs: Decreased breath sounds in bases Abdomen: Soft nontender Musculoskeletal: Left BKA noted with prosthesis Skin: Dry warm  RESOLVED PROBLEM LIST   ASSESSMENT AND PLAN   Vent dependent respiratory failure secondary to cardiac arrest Full mechanical ventilatory support ABGs to establish baseline  parameters Bronchodilators  Post cardiac arrest Hypothermia protocol 33 degrees Cardiology consult Admit to the intensive care unit  End-stage renal disease Renal consult Note he is gets dialysis Monday Wednesdays and Fridays  Myoclonus jerks postarrest suspect acute anoxic injury CT of the head Valproic acid Neurology consult  Peripheral vascular disease status post left BKA Monitor pulses  SUMMARY OF TODAY'S PLAN:  67 year old end-stage renal patient status post cardiac arrest Admit to the intensive care unit Hypothermia protocol 33 degrees Sedation with fentanyl propofol neuromuscular blockade Nephrology consulted for end-stage renal disease CT of the head has been performed Family discussion poor prognosis for MD  Best Practice / Goals of Care / Disposition.   DVT PROPHYLAXIS:scd SUP: NUTRITION:npo MOBILITY:bedrest GOALS OF CARE:full FAMILY DISCUSSIONS: 10/11 per MD. Poor prognosis DISPOSITION ICU  LABS  Glucose No results for input(s): GLUCAP in the last 168 hours.  BMET Recent Labs  Lab 02/08/2018 0730  NA 143  K 3.9  CL 107  BUN 35*  CREATININE 11.60*  GLUCOSE 141*    Liver Enzymes No results for input(s): AST, ALT, ALKPHOS, BILITOT, ALBUMIN in the last 168 hours.  Electrolytes No results for input(s): CALCIUM, MG, PHOS in the last 168 hours.  CBC Recent Labs  Lab 01/21/2018 0711 02/12/2018 0730  WBC 9.4  --   HGB 11.8* 12.2*  HCT 37.8* 36.0*  PLT 177  --     ABG No results for input(s): PHART, PCO2ART, PO2ART in the last 168 hours.  Coag's No results for  input(s): APTT, INR in the last 168 hours.  Sepsis Markers Recent Labs  Lab 01/20/2018 0730  LATICACIDVEN 8.01*    Cardiac Enzymes No results for input(s): TROPONINI, PROBNP in the last 168 hours.  PAST MEDICAL HISTORY :   He  has a past medical history of Anemia of chronic disease, ESRD (end stage renal disease) ([place]), Essential hypertension, GERD (gastroesophageal reflux  disease), History of kidney stones, Hypercholesteremia, Peripheral vascular disease ([place]), Sarcoidosis, and Type 2 diabetes mellitus ([place]).  PAST SURGICAL HISTORY:  He  has a past surgical history that includes Below knee leg amputation (Left); Amputation of replicated toes (Right, 2010); Esophageal dilation; Eye surgery (Right); AV fistula placement (Left, 12/31/2012); Fistulogram (N/A, 07/27/2014); AV fistula placement (Right, 11/03/2014); Colonoscopy; Colonoscopy (N/A, 02/22/2017); IR Fluoro Guide CV Line Right (07/10/2017); IR US Guide Vasc Access Right (07/10/2017); IR Fluoro Guide CV Line Right (07/11/2017); UPPER EXTREMITY VENOGRAPHY (Right, 08/21/2017); Femoral artery debridement (Right, 08/28/2017); UPPER EXTREMITY VENOGRAPHY (N/A, 10/11/2017); AV fistula placement (Left, 10/30/2017); and IR Removal Tun Cv Cath W/O FL (12/13/2017).  No Known Allergies  No current facility-administered medications on file prior to encounter.    Current Outpatient Medications on File Prior to Encounter  Medication Sig  . acetaminophen (TYLENOL) 650 MG CR tablet Take 650 mg by mouth every 8 (eight) hours as needed for pain.  Marland Kitchen amLODipine (NORVASC) 10 MG tablet Take 10 mg by mouth daily.  Marland Kitchen aspirin EC 81 MG tablet Take 1 tablet (81 mg total) at bedtime by mouth.  Marland Kitchen atorvastatin (LIPITOR) 80 MG tablet Take 40 mg by mouth every evening.   . cloNIDine (CATAPRES - DOSED IN MG/24 HR) 0.2 mg/24hr patch Place 0.2 mg onto the skin once a week.   . hydrALAZINE (APRESOLINE) 25 MG tablet Take 75 mg by mouth 2 (two) times daily.  . insulin glargine (LANTUS) 100 UNIT/ML injection Inject 20 Units into the skin daily as needed (Only if blossd sugar gets above 130). In the morning  . metoprolol tartrate (LOPRESSOR) 50 MG tablet Take 50 mg by mouth 2 (two) times daily.  . multivitamin (RENA-VIT) TABS tablet Take 1 tablet by mouth daily.  . sevelamer carbonate (RENVELA) 800 MG tablet Take 800-1,600 mg by mouth See admin instructions. Take  2 tablets (1600 mg) by mouth with meals and 1 tablet (800 mg) with snacks  . sitaGLIPtin (JANUVIA) 100 MG tablet Take 100 mg by mouth at bedtime.   . timolol (TIMOPTIC) 0.5 % ophthalmic solution Place 1 drop into both eyes 2 (two) times daily.    FAMILY HISTORY:   His family history includes Colon cancer in his mother; Diabetes in his mother; Diabetes Mellitus II in his mother.  SOCIAL HISTORY:  He  reports that he quit smoking about 19 years ago. His smoking use included cigarettes. He quit after 20.00 years of use. He has never used smokeless tobacco. He reports that he does not drink alcohol or use drugs.  REVIEW OF SYSTEMS:    Na

## 2018-01-25 NOTE — Progress Notes (Signed)
Cental line placed. Dr. Veleta Miners assessed - can access

## 2018-01-25 NOTE — Progress Notes (Signed)
EEG complete - results pending 

## 2018-01-25 NOTE — Progress Notes (Signed)
   01/20/2018 1100  Clinical Encounter Type  Visited With Patient;Patient and family together;Health care provider  Visit Type Initial;Psychological support;Code;Critical Care;ED  Referral From Other (Comment) (Code Cool)  Consult/Referral To Chaplain  Spiritual Encounters  Spiritual Needs Emotional;Grief support  Stress Factors  Patient Stress Factors Health changes  Family Stress Factors Health changes;Major life changes   Responded to code cool.  Met w/ pt's wife and daughter in consult, accompanied family bk to trauma bay present when dr explained pt's condition, took family up to [facility] waiting, let staff know.  Handed off to unit chaplain on day shift.  Myra Gianotti resident, [phone]

## 2018-01-25 NOTE — Progress Notes (Signed)
RLE IO removed w/o difficulty.  Pressure applied to site w/gauze dressing in place.

## 2018-01-25 NOTE — Progress Notes (Signed)
Pt's left prosthetic leg and shoes given to pt's wife, Clarene Critchley

## 2018-01-25 NOTE — ED Provider Notes (Signed)
[name] EMERGENCY DEPARTMENT Provider Note   CSN: [id number] Arrival date & time: 01/28/2018  0702     History   Chief Complaint Chief Complaint  Patient presents with  . post arrest    HPI [name] is a 67 y.o. male.  HPI Level 5 caveat due to cardiac arrest. Patient presented post CPR.  Patient is a Monday Wednesday Friday dialysis at [facility] in [place].  Was going into dialysis today with today being Friday.  Unsure if went on Wednesday but there was a report he has not missed dialysis.  Patient had had nausea and some vomiting in the car before going in.  Once arriving in dialysis had a cardiac arrest.  Had a PEA.  Immediate CPR started and 18 minutes total return of vitals.  Had epi x2 bicarb and calcium.  Somewhat difficult intubation. Past Medical History:  Diagnosis Date  . Anemia of chronic disease   . ESRD (end stage renal disease) ([place])    MWF [name]  . Essential hypertension   . GERD (gastroesophageal reflux disease)     not current  . History of kidney stones    passed  . Hypercholesteremia   . Peripheral vascular disease ([name])   . Sarcoidosis   . Type 2 diabetes mellitus ([name])    Type II    Patient Active Problem List   Diagnosis Date Noted  . Cardiac arrest ([place]) 02/11/2018  . ESRD (end stage renal disease) ([place]) 07/10/2017  . Acute respiratory failure ([place]) 07/10/2017  . Sarcoidosis 07/10/2017  . Hyperlipidemia 07/10/2017  . Peripheral vascular disease ([name]) 07/10/2017  . Fluid overload 06/29/2017  . Acute respiratory failure with hypoxia ([place]) 06/29/2017  . Noncompliance of patient with renal dialysis ([facility] [name]) 06/29/2017  . Type 2 diabetes mellitus with stage 5 chronic kidney disease ([facility] [name]) 06/29/2017  . Special screening for malignant neoplasms, colon 10/16/2016  . Family history of colon cancer 10/16/2016  . End stage renal disease ([facility] [name]) 02/12/2013  . Chronic kidney disease, stage IV (severe) ([facility]ne [name]) 12/11/2012    . Anemia 11/18/2010    Past Surgical History:  Procedure Laterality Date  . AMPUTATION OF REPLICATED TOES Right 2595   3rd, 4th, 5th digits (toes) of right foot  . AV FISTULA PLACEMENT Left 12/31/2012   Procedure: ARTERIOVENOUS (AV) FISTULA CREATION-LEFT;  Surgeon: Angelia Mould, MD;  Location: [facility];  Service: Vascular;  Laterality: Left;  . AV FISTULA PLACEMENT Right 11/03/2014   Procedure: INSERTION OF RIGHT ARM  ARTERIOVENOUS (AV) GORE-TEX GRAFT;  Surgeon: Angelia Mould, MD;  Location: [facility];  Service: Vascular;  Laterality: Right;  . AV FISTULA PLACEMENT Left 10/30/2017   Procedure: INSERTION OF 4-65mm x 45cm ARTERIOVENOUS (AV) GORE-TEX GRAFT ARM LEFT UPPER EXTREMITY;  Surgeon: Angelia Mould, MD;  Location: [facility];  Service: Vascular;  Laterality: Left;  . BELOW KNEE LEG AMPUTATION Left   . COLONOSCOPY    . COLONOSCOPY N/A 02/22/2017   Procedure: COLONOSCOPY;  Surgeon: Rogene Houston, MD;  [facility];  Service: Endoscopy;  Laterality: N/A;  830  . ESOPHAGEAL DILATION     8-10 yrs ago   . EYE SURGERY Right   . FEMORAL ARTERY EXPLORATION Right 08/28/2017   Procedure: Exploration of Right Axillary Vein;  Surgeon: Elam Dutch, MD;  Location: [name];  Service: Vascular;  Laterality: Right;  . FISTULOGRAM N/A 07/27/2014   Procedure: FISTULOGRAM;  Surgeon: Angelia Mould, MD;  Location: [facility] CATH LAB;  Service: Cardiovascular;  Laterality: N/A;  . IR FLUORO GUIDE CV LINE RIGHT  07/10/2017  . IR FLUORO GUIDE CV LINE RIGHT  07/11/2017  . IR REMOVAL TUN CV CATH W/O FL  12/13/2017  . IR US GUIDE VASC ACCESS RIGHT  07/10/2017  . UPPER EXTREMITY VENOGRAPHY Right 08/21/2017   Procedure: UPPER EXTREMITY VENOGRAPHY;  Surgeon: Serafina Mitchell, MD;  Location: [facility] CV LAB;  Service: Cardiovascular;  Laterality: Right;  . UPPER EXTREMITY VENOGRAPHY N/A 10/11/2017   Procedure: UPPER EXTREMITY VENOGRAPHY;  Surgeon: Conrad Monterey, MD;  Location: [name]  CV LAB;  Service: Cardiovascular;  Laterality: N/A;        Home Medications    Prior to Admission medications   Medication Sig Start Date End Date Taking? Authorizing Provider  acetaminophen (TYLENOL) 650 MG CR tablet Take 650 mg by mouth every 8 (eight) hours as needed for pain.    [provider]  amLODipine (NORVASC) 10 MG tablet Take 10 mg by mouth daily.    [provider]  aspirin EC 81 MG tablet Take 1 tablet (81 mg total) at bedtime by mouth. 02/24/17   Rehman, Mechele Dawley, MD  atorvastatin (LIPITOR) 80 MG tablet Take 40 mg by mouth every evening.     [provider]  cloNIDine (CATAPRES - DOSED IN MG/24 HR) 0.2 mg/24hr patch Place 0.2 mg onto the skin once a week.  02/28/17   [provider]  hydrALAZINE (APRESOLINE) 25 MG tablet Take 75 mg by mouth 2 (two) times daily.    [provider]  insulin glargine (LANTUS) 100 UNIT/ML injection Inject 20 Units into the skin daily as needed (Only if blossd sugar gets above 130). In the morning    [provider]  metoprolol tartrate (LOPRESSOR) 50 MG tablet Take 50 mg by mouth 2 (two) times daily. 09/05/17   [provider]  multivitamin (RENA-VIT) TABS tablet Take 1 tablet by mouth daily.    [provider]  sevelamer carbonate (RENVELA) 800 MG tablet Take 800-1,600 mg by mouth See admin instructions. Take 2 tablets (1600 mg) by mouth with meals and 1 tablet (800 mg) with snacks    [provider]  sitaGLIPtin (JANUVIA) 100 MG tablet Take 100 mg by mouth at bedtime.     [provider]  timolol (TIMOPTIC) 0.5 % ophthalmic solution Place 1 drop into both eyes 2 (two) times daily. 02/13/17   [provider]    Family History Family History  Problem Relation Age of Onset  . Diabetes Mellitus II Mother   . Diabetes Mother   . Colon cancer Mother     Social History Social History   Tobacco Use  . Smoking status: Former Smoker    Years:  20.00    Types: Cigarettes    Last attempt to quit: 04/17/1998    Years since quitting: 19.7  . Smokeless tobacco: Never Used  Substance Use Topics  . Alcohol use: No    Alcohol/week: 0.0 standard drinks    Frequency: Never    Comment: rare  . Drug use: No     Allergies   Patient has no known allergies.   Review of Systems Review of Systems  Unable to perform ROS: Acuity of condition     Physical Exam Updated Vital Signs BP (!) 166/70   Pulse (!) 54   Resp 15   Ht 6\' 1"  (1.854 m)   Wt 72.6 kg   SpO2 100%  BMI 21.11 kg/m   Physical Exam  Constitutional: He appears well-developed.  HENT:  Head: Atraumatic.  Several missing teeth and mouth but some blood at the site of upper tooth to the right of the midline without a tooth there.  Eyes:  Pupils unresponsive  Neck: Neck supple.  Cardiovascular: Normal rate.  Pulmonary/Chest: He has rales.  Diffuse rales.  Abdominal: There is no tenderness.  Musculoskeletal:  IO line proximately on right tibia.  Dialysis graft left upper arm.  Neurological:  Intubated.  Some respirations potentially above vent but no response to pain.  No response to deep suctioning.  Skin: Skin is warm.     ED Treatments / Results  Labs (all labs ordered are listed, but only abnormal results are displayed) Labs Reviewed  CBC WITH DIFFERENTIAL/PLATELET - Abnormal; Notable for the following components:      Result Value   RBC 3.87 (*)    Hemoglobin 11.8 (*)    HCT 37.8 (*)    Abs Immature Granulocytes 0.23 (*)    All other components within normal limits  I-STAT CG4 LACTIC ACID, ED - Abnormal; Notable for the following components:   Lactic Acid, Venous 8.01 (*)    All other components within normal limits  I-STAT TROPONIN, ED - Abnormal; Notable for the following components:   Troponin i, poc 0.18 (*)    All other components within normal limits  I-STAT CHEM 8, ED - Abnormal; Notable for the following components:   BUN 35 (*)     Creatinine, Ser 11.60 (*)    Glucose, Bld 141 (*)    Calcium, Ion 1.07 (*)    Hemoglobin 12.2 (*)    HCT 36.0 (*)    All other components within normal limits  COMPREHENSIVE METABOLIC PANEL  HIV ANTIBODY (ROUTINE TESTING W REFLEX)  TROPONIN I  TROPONIN I  TROPONIN I  BASIC METABOLIC PANEL  BASIC METABOLIC PANEL  BASIC METABOLIC PANEL  BASIC METABOLIC PANEL  BASIC METABOLIC PANEL  BASIC METABOLIC PANEL  BASIC METABOLIC PANEL  BASIC METABOLIC PANEL  PROTIME-INR  PROTIME-INR  APTT  APTT  BLOOD GAS, ARTERIAL  BLOOD GAS, ARTERIAL  HEMOGLOBIN A1C    EKG EKG Interpretation  Date/Time:  Friday January 25 2018 07:04:47 EDT Ventricular Rate:  82 PR Interval:    QRS Duration: 87 QT Interval:  428 QTC Calculation: 500 R Axis:   85 Text Interpretation:  Sinus rhythm Probable left atrial enlargement Borderline right axis deviation Nonspecific repol abnormality, diffuse leads Borderline prolonged QT interval Baseline wander in lead(s) III V3 V4 V5 V6 Confirmed by Davonna Belling [phone]) on 01/19/2018 7:34:55 AM   Radiology Ct Head Wo Contrast  Result Date: 02/08/2018 CLINICAL DATA:  Altered level of consciousness.  Dialysis patient EXAM: CT HEAD WITHOUT CONTRAST TECHNIQUE: Contiguous axial images were obtained from the base of the skull through the vertex without intravenous contrast. COMPARISON:  MRI head 03/14/2017 FINDINGS: Brain: Mild atrophy. Patchy white matter hypodensity bilaterally consistent with chronic microvascular ischemia. Negative for acute infarct, hemorrhage, or mass. Vascular: Negative for hyperdense vessel. Skull: Negative Sinuses/Orbits: Mucosal edema and bony thickening left sphenoid sinus. Air-fluid levels in the maxillary sinus bilaterally. Small air-fluid level right sphenoid sinus. No orbital mass. Right cataract surgery. Other: None IMPRESSION: No acute abnormality. Atrophy and chronic microvascular ischemic changes in the white matter. Sinusitis with  air-fluid levels. Electronically Signed   By: Franchot Gallo M.D.   On: 01/28/2018 08:06   Dg Chest Portable 1 View  Result  Date: 02/08/2018 CLINICAL DATA:  Cardiac arrest.  ESRD.  Sarcoid EXAM: PORTABLE CHEST 1 VIEW COMPARISON:  07/10/2017 FINDINGS: Endotracheal tube in good position.  NG tube in the stomach. Diffuse bilateral airspace disease bilaterally. Small bilateral pleural effusions. Calcified lymph nodes left superior mediastinum unchanged. IMPRESSION: Endotracheal tube in good position. Symmetric bilateral airspace disease with bilateral effusions. Probable pulmonary edema. Electronically Signed   By: Franchot Gallo M.D.   On: 01/20/2018 08:03    Procedures Procedures (including critical care time)  Medications Ordered in ED Medications  norepinephrine (LEVOPHED) 4mg  in D5W 25mL premix infusion (has no administration in time range)  aspirin suppository 300 mg (has no administration in time range)  artificial tears (LACRILUBE) ophthalmic ointment 1 application (has no administration in time range)  0.9 %  sodium chloride infusion (has no administration in time range)  famotidine (PEPCID) IVPB 20 mg premix (has no administration in time range)  fentaNYL (SUBLIMAZE) injection 50 mcg (has no administration in time range)  fentaNYL 2547mcg in NS 259mL (47mcg/ml) infusion-PREMIX (100 mcg/hr Intravenous New Bag/Given 02/02/2018 0810)  fentaNYL (SUBLIMAZE) bolus via infusion 25 mcg (has no administration in time range)  propofol (DIPRIVAN) 1000 MG/100ML infusion (has no administration in time range)  cisatracurium (NIMBEX) bolus via infusion 7.3 mg (has no administration in time range)    And  cisatracurium (NIMBEX) 200 mg in sodium chloride 0.9 % 200 mL (1 mg/mL) infusion (has no administration in time range)    And  cisatracurium (NIMBEX) bolus via infusion 3.6 mg (has no administration in time range)  insulin aspart (novoLOG) injection 0-9 Units (has no administration in time range)      Initial Impression / Assessment and Plan / ED Course  I have reviewed the triage vital signs and the nursing notes.  Pertinent labs & imaging results that were available during my care of the patient were reviewed by me and considered in my medical decision making (see chart for details).     Patient presented after return of vitals after cardiac arrest.  Downtime of 18 minutes.  Has had some improvement in neurologic status while in the ER now responding somewhat to pain and opening eyes.  Intubated by EMS.  Seen by critical care.  Admit to ICU.  Patient is on hypothermia protocol.   CRITICAL CARE Performed by: Davonna Belling Total critical care time: 30 minutes Critical care time was exclusive of separately billable procedures and treating other patients. Critical care was necessary to treat or prevent imminent or life-threatening deterioration. Critical care was time spent personally by me on the following activities: development of treatment plan with patient and/or surrogate as well as nursing, discussions with consultants, evaluation of patient's response to treatment, examination of patient, obtaining history from patient or surrogate, ordering and performing treatments and interventions, ordering and review of laboratory studies, ordering and review of radiographic studies, pulse oximetry and re-evaluation of patient's condition.   Final Clinical Impressions(s) / ED Diagnoses   Final diagnoses:  Cardiac arrest [name])  End stage renal disease on dialysis [facility])    ED Discharge Orders    None       Davonna Belling, MD 02/02/2018 [phone]

## 2018-01-25 NOTE — ED Notes (Signed)
Patient lab results was given to [place].

## 2018-01-25 NOTE — Consult Note (Signed)
[name] Admit Date: 01/28/2018 01/20/2018 [place] Agent Requesting Physician:  Lynetta Mare MD  Reason for Consult:  ESRD Comanagement HPI:  67 year old male ESRD [facility] via left upper extremity AVGwho had witnessed cardiac arrest upon arrival at hemodialysis this morning.  Rhythm was PEA.  Required prolonged CPR and intubation in route to emergency room.  Labs here potassium 4.1, bicarbonate 22, BUN 31.    Patient is having myoclonic jerking.  Bruit and thrill present in AVG.   Creatinine, Ser (mg/dL)  Date Value  02/01/2018 10.75 (H)  01/20/2018 11.60 (H)  01/28/2018 10.90 (H)  10/11/2017 7.70 (H)  08/21/2017 8.60 (H)  07/11/2017 9.05 (H)  07/10/2017 7.91 (H)  07/10/2017 14.46 (H)  06/30/2017 13.76 (H)  06/29/2017 14.91 (H)  ] ROS Balance of 12 systems is not possible given neurological state  PMH  Past Medical History:  Diagnosis Date  . Anemia of chronic disease   . ESRD (end stage renal disease) ([place])    [name]  . Essential hypertension   . GERD (gastroesophageal reflux disease)     not current  . History of kidney stones    passed  . Hypercholesteremia   . Peripheral vascular disease ([place])   . Sarcoidosis   . Type 2 diabetes mellitus ([facility])    Type II   PSH  Past Surgical History:  Procedure Laterality Date  . AMPUTATION OF REPLICATED TOES Right 2841   3rd, 4th, 5th digits (toes) of right foot  . AV FISTULA PLACEMENT Left 12/31/2012   Procedure: ARTERIOVENOUS (AV) FISTULA CREATION-LEFT;  Surgeon: Angelia Mould, MD;  [facility];  Service: Vascular;  Laterality: Left;  . AV FISTULA PLACEMENT Right 11/03/2014   Procedure: INSERTION OF RIGHT ARM  ARTERIOVENOUS (AV) GORE-TEX GRAFT;  Surgeon: Angelia Mould, MD;  Location: [facility];  Service: Vascular;  Laterality: Right;  . AV FISTULA PLACEMENT Left 10/30/2017   Procedure: INSERTION OF 4-54mm x 45cm ARTERIOVENOUS (AV) GORE-TEX GRAFT ARM LEFT UPPER EXTREMITY;   Surgeon: Angelia Mould, MD;  Location: [facility];  Service: Vascular;  Laterality: Left;  . BELOW KNEE LEG AMPUTATION Left   . COLONOSCOPY    . COLONOSCOPY N/A 02/22/2017   Procedure: COLONOSCOPY;  Surgeon: Rogene Houston, MD;  [facility];  Service: Endoscopy;  Laterality: N/A;  830  . ESOPHAGEAL DILATION     8-10 yrs ago   . EYE SURGERY Right   . FEMORAL ARTERY EXPLORATION Right 08/28/2017   Procedure: Exploration of Right Axillary Vein;  Surgeon: Elam Dutch, MD;  Location: [facility];  Service: Vascular;  Laterality: Right;  . FISTULOGRAM N/A 07/27/2014   Procedure: FISTULOGRAM;  Surgeon: Angelia Mould, MD;  Location: [facility] CATH LAB;  Service: Cardiovascular;  Laterality: N/A;  . IR FLUORO GUIDE CV LINE RIGHT  07/10/2017  . IR FLUORO GUIDE CV LINE RIGHT  07/11/2017  . IR REMOVAL TUN CV CATH W/O FL  12/13/2017  . IR US GUIDE VASC ACCESS RIGHT  07/10/2017  . UPPER EXTREMITY VENOGRAPHY Right 08/21/2017   Procedure: UPPER EXTREMITY VENOGRAPHY;  Surgeon: Serafina Mitchell, MD;  Location: [facility] CV LAB;  Service: Cardiovascular;  Laterality: Right;  . UPPER EXTREMITY VENOGRAPHY N/A 10/11/2017   Procedure: UPPER EXTREMITY VENOGRAPHY;  Surgeon: Conrad Eureka, MD;  Location: [facility] CV LAB;  Service: Cardiovascular;  Laterality: N/A;   FH  Family History  Problem Relation Age of Onset  . Diabetes Mellitus II Mother   .  Diabetes Mother   . Colon cancer Mother    SH  reports that he quit smoking about 19 years ago. His smoking use included cigarettes. He quit after 20.00 years of use. He has never used smokeless tobacco. He reports that he does not drink alcohol or use drugs. Allergies No Known Allergies Home medications Prior to Admission medications   Medication Sig Start Date End Date Taking? Authorizing Provider  acetaminophen (TYLENOL) 650 MG CR tablet Take 650 mg by mouth every 8 (eight) hours as needed for pain.    [provider]  amLODipine  (NORVASC) 10 MG tablet Take 10 mg by mouth daily.    [provider]  aspirin EC 81 MG tablet Take 1 tablet (81 mg total) at bedtime by mouth. 02/24/17   Rehman, Mechele Dawley, MD  atorvastatin (LIPITOR) 80 MG tablet Take 40 mg by mouth every evening.     [provider]  cloNIDine (CATAPRES - DOSED IN MG/24 HR) 0.2 mg/24hr patch Place 0.2 mg onto the skin once a week.  02/28/17   [provider]  hydrALAZINE (APRESOLINE) 25 MG tablet Take 75 mg by mouth 2 (two) times daily.    [provider]  insulin glargine (LANTUS) 100 UNIT/ML injection Inject 20 Units into the skin daily as needed (Only if blossd sugar gets above 130). In the morning    [provider]  metoprolol tartrate (LOPRESSOR) 50 MG tablet Take 50 mg by mouth 2 (two) times daily. 09/05/17   [provider]  multivitamin (RENA-VIT) TABS tablet Take 1 tablet by mouth daily.    [provider]  sevelamer carbonate (RENVELA) 800 MG tablet Take 800-1,600 mg by mouth See admin instructions. Take 2 tablets (1600 mg) by mouth with meals and 1 tablet (800 mg) with snacks    [provider]  sitaGLIPtin (JANUVIA) 100 MG tablet Take 100 mg by mouth at bedtime.     [provider]  timolol (TIMOPTIC) 0.5 % ophthalmic solution Place 1 drop into both eyes 2 (two) times daily. 02/13/17   [provider]    Current Medications Scheduled Meds: . artificial tears  1 application Both Eyes F8B  . aspirin  300 mg Rectal NOW  . fentaNYL (SUBLIMAZE) injection  50 mcg Intravenous Once  . insulin aspart  0-9 Units Subcutaneous Q4H   Continuous Infusions: . sodium chloride    . cisatracurium (NIMBEX) infusion 1.0809 mcg/kg/min (02/12/2018 1100)  . famotidine (PEPCID) IV    . fentaNYL infusion INTRAVENOUS 100 mcg/hr (02/08/2018 1100)  . norepinephrine (LEVOPHED) Adult infusion    . propofol (DIPRIVAN) infusion 25 mcg/kg/min (01/24/2018 1100)  . propofol    . valproate sodium      PRN Meds:.[COMPLETED] cisatracurium **AND** cisatracurium (NIMBEX) infusion **AND** cisatracurium, fentaNYL  CBC Recent Labs  Lab 01/26/2018 0711 02/06/2018 0730 02/02/2018 1124  WBC 9.4  --   --   NEUTROABS 6.1  --   --   HGB 11.8* 12.2* 9.5*  HCT 37.8* 36.0* 28.0*  MCV 97.7  --   --   PLT 177  --   --    Basic Metabolic Panel Recent Labs  Lab 01/31/2018 0711 02/07/2018 0730 01/31/2018 0817 01/21/2018 1124  NA 145 143 145 142  K 4.0 3.9 4.2 4.1  CL 104 107 103  --   CO2 21*  --  22  --   GLUCOSE 147* 141* 156* 172*  BUN 30* 35* 31*  --   CREATININE 10.90* 11.60*  10.75*  --   CALCIUM 9.4  --  9.2  --     Physical Exam  Blood pressure (!) 148/67, pulse (!) 56, temperature (!) 90.7 F (32.6 C), temperature source Other (Comment), resp. rate (!) 25, height 6\' 1"  (1.854 m), weight 72.6 kg, SpO2 100 %. GEN: Intubated, having myoclonic jerking ENT: NCAT EYES: Eyes closed CV: Regular, normal S1 and S2, no rub PULM: Coarse breath sounds bilaterally ABD: Soft, nontender SKIN: No rashes or lesions, right shin IO catheter in place EXT: No edema; L BKA Stump w/o edema Left upper extremity AV graft with bruit and thrill   Assessment 48M ESRD with PEA cardiac arrest  1. ESRD MWF [facility] using LUE AVG 2. Prolonged PEA Cardiac Arrest 3. Anoxic Brain Injury  Plan 1. No immediate need for hemodialysis 2. Request outpatient records 3. Will follow along   Pearson Grippe MD [phone] pgr 02/08/2018, 11:50 AM

## 2018-01-25 NOTE — ED Notes (Signed)
Ice packs applied

## 2018-01-26 LAB — BASIC METABOLIC PANEL
Anion gap: 11 (ref 5–15)
Anion gap: 11 (ref 5–15)
Anion gap: 12 (ref 5–15)
BUN: 38 mg/dL — ABNORMAL HIGH (ref 8–23)
BUN: 45 mg/dL — AB (ref 8–23)
BUN: 45 mg/dL — ABNORMAL HIGH (ref 8–23)
CHLORIDE: 106 mmol/L (ref 98–111)
CHLORIDE: 107 mmol/L (ref 98–111)
CHLORIDE: 108 mmol/L (ref 98–111)
CO2: 23 mmol/L (ref 22–32)
CO2: 23 mmol/L (ref 22–32)
CO2: 24 mmol/L (ref 22–32)
CREATININE: 11.46 mg/dL — AB (ref 0.61–1.24)
Calcium: 7.3 mg/dL — ABNORMAL LOW (ref 8.9–10.3)
Calcium: 7.5 mg/dL — ABNORMAL LOW (ref 8.9–10.3)
Calcium: 8.2 mg/dL — ABNORMAL LOW (ref 8.9–10.3)
Creatinine, Ser: 11 mg/dL — ABNORMAL HIGH (ref 0.61–1.24)
Creatinine, Ser: 11.62 mg/dL — ABNORMAL HIGH (ref 0.61–1.24)
GFR calc Af Amer: 5 mL/min — ABNORMAL LOW (ref >60)
GFR calc Af Amer: 5 mL/min — ABNORMAL LOW (ref >60)
GFR calc non Af Amer: 4 mL/min — ABNORMAL LOW (ref >60)
GFR calc non Af Amer: 4 mL/min — ABNORMAL LOW (ref >60)
GFR calc non Af Amer: 4 mL/min — ABNORMAL LOW (ref >60)
GFR, EST AFRICAN AMERICAN: 5 mL/min — AB (ref >60)
Glucose, Bld: 108 mg/dL — ABNORMAL HIGH (ref 70–99)
Glucose, Bld: 67 mg/dL — ABNORMAL LOW (ref 70–99)
Glucose, Bld: 81 mg/dL (ref 70–99)
POTASSIUM: 4.2 mmol/L (ref 3.5–5.1)
POTASSIUM: 4.4 mmol/L (ref 3.5–5.1)
Potassium: 4.4 mmol/L (ref 3.5–5.1)
SODIUM: 142 mmol/L (ref 135–145)
Sodium: 141 mmol/L (ref 135–145)
Sodium: 142 mmol/L (ref 135–145)

## 2018-01-26 LAB — POCT I-STAT 3, ART BLOOD GAS (G3+)
ACID-BASE EXCESS: 2 mmol/L (ref 0.0–2.0)
Acid-Base Excess: 4 mmol/L — ABNORMAL HIGH (ref 0.0–2.0)
Acid-Base Excess: 1 mmol/L (ref 0.0–2.0)
Acid-Base Excess: 1 mmol/L (ref 0.0–2.0)
Bicarbonate: 27 mmol/L (ref 20.0–28.0)
Bicarbonate: 24.8 mmol/L (ref 20.0–28.0)
Bicarbonate: 25.7 mmol/L (ref 20.0–28.0)
Bicarbonate: 26.2 mmol/L (ref 20.0–28.0)
O2 SAT: 99 %
O2 SAT: 98 %
O2 Saturation: 99 %
O2 Saturation: 99 %
PCO2 ART: 30.9 mmHg — AB (ref 32.0–48.0)
PCO2 ART: 41.8 mmHg (ref 32.0–48.0)
PH ART: 7.498 — AB (ref 7.350–7.450)
PO2 ART: 104 mmHg (ref 83.0–108.0)
Patient temperature: 33
Patient temperature: 33.5
Patient temperature: 36
TCO2: 28 mmol/L (ref 22–32)
TCO2: 26 mmol/L (ref 22–32)
TCO2: 27 mmol/L (ref 22–32)
TCO2: 28 mmol/L (ref 22–32)
pCO2 arterial: 29.3 mmHg — ABNORMAL LOW (ref 32.0–48.0)
pCO2 arterial: 31.3 mmHg — ABNORMAL LOW (ref 32.0–48.0)
pH, Arterial: 7.557 — ABNORMAL HIGH (ref 7.350–7.450)
pH, Arterial: 7.51 — ABNORMAL HIGH (ref 7.350–7.450)
pH, Arterial: 7.401 (ref 7.350–7.450)
pO2, Arterial: 102 mmHg (ref 83.0–108.0)
pO2, Arterial: 93 mmHg (ref 83.0–108.0)
pO2, Arterial: 102 mmHg (ref 83.0–108.0)

## 2018-01-26 LAB — GLUCOSE, CAPILLARY
GLUCOSE-CAPILLARY: 108 mg/dL — AB (ref 70–99)
GLUCOSE-CAPILLARY: 99 mg/dL (ref 70–99)
Glucose-Capillary: 96 mg/dL (ref 70–99)
Glucose-Capillary: 98 mg/dL (ref 70–99)
Glucose-Capillary: 101 mg/dL — ABNORMAL HIGH (ref 70–99)
Glucose-Capillary: 92 mg/dL (ref 70–99)
Glucose-Capillary: 91 mg/dL (ref 70–99)
Glucose-Capillary: 73 mg/dL (ref 70–99)
Glucose-Capillary: 76 mg/dL (ref 70–99)
Glucose-Capillary: 60 mg/dL — ABNORMAL LOW (ref 70–99)

## 2018-01-26 LAB — BASIC METABOLIC PANEL WITH GFR
Anion gap: 12 (ref 5–15)
Anion gap: 11 (ref 5–15)
BUN: 40 mg/dL — ABNORMAL HIGH (ref 8–23)
BUN: 42 mg/dL — ABNORMAL HIGH (ref 8–23)
CO2: 23 mmol/L (ref 22–32)
CO2: 24 mmol/L (ref 22–32)
Calcium: 8.1 mg/dL — ABNORMAL LOW (ref 8.9–10.3)
Calcium: 8 mg/dL — ABNORMAL LOW (ref 8.9–10.3)
Chloride: 109 mmol/L (ref 98–111)
Chloride: 107 mmol/L (ref 98–111)
Creatinine, Ser: 11.1 mg/dL — ABNORMAL HIGH (ref 0.61–1.24)
Creatinine, Ser: 11.02 mg/dL — ABNORMAL HIGH (ref 0.61–1.24)
GFR calc Af Amer: 5 mL/min — ABNORMAL LOW (ref >60)
GFR calc Af Amer: 5 mL/min — ABNORMAL LOW (ref >60)
GFR calc non Af Amer: 4 mL/min — ABNORMAL LOW (ref >60)
GFR calc non Af Amer: 4 mL/min — ABNORMAL LOW (ref >60)
Glucose, Bld: 108 mg/dL — ABNORMAL HIGH (ref 70–99)
Glucose, Bld: 103 mg/dL — ABNORMAL HIGH (ref 70–99)
Potassium: 4.5 mmol/L (ref 3.5–5.1)
Potassium: 4.5 mmol/L (ref 3.5–5.1)
Sodium: 144 mmol/L (ref 135–145)
Sodium: 142 mmol/L (ref 135–145)

## 2018-01-26 LAB — POCT I-STAT 4, (NA,K, GLUC, HGB,HCT)
GLUCOSE: 88 mg/dL (ref 70–99)
GLUCOSE: 90 mg/dL (ref 70–99)
GLUCOSE: 73 mg/dL (ref 70–99)
HEMATOCRIT: 22 % — AB (ref 39.0–52.0)
HEMATOCRIT: 25 % — AB (ref 39.0–52.0)
HEMATOCRIT: 27 % — AB (ref 39.0–52.0)
HEMOGLOBIN: 7.5 g/dL — AB (ref 13.0–17.0)
Hemoglobin: 8.5 g/dL — ABNORMAL LOW (ref 13.0–17.0)
Hemoglobin: 9.2 g/dL — ABNORMAL LOW (ref 13.0–17.0)
POTASSIUM: 3.5 mmol/L (ref 3.5–5.1)
POTASSIUM: 3.7 mmol/L (ref 3.5–5.1)
POTASSIUM: 4.1 mmol/L (ref 3.5–5.1)
SODIUM: 141 mmol/L (ref 135–145)
Sodium: 147 mmol/L — ABNORMAL HIGH (ref 135–145)
Sodium: 145 mmol/L (ref 135–145)

## 2018-01-26 LAB — CBC
HCT: 28.9 % — ABNORMAL LOW (ref 39.0–52.0)
Hemoglobin: 9.2 g/dL — ABNORMAL LOW (ref 13.0–17.0)
MCH: 29.3 pg (ref 26.0–34.0)
MCHC: 31.8 g/dL (ref 30.0–36.0)
MCV: 92 fL (ref 80.0–100.0)
PLATELETS: 105 10*3/uL — AB (ref 150–400)
RBC: 3.14 MIL/uL — ABNORMAL LOW (ref 4.22–5.81)
RDW: 13.2 % (ref 11.5–15.5)
WBC: 5.9 10*3/uL (ref 4.0–10.5)
nRBC: 0 % (ref 0.0–0.2)

## 2018-01-26 LAB — PHOSPHORUS: Phosphorus: 5 mg/dL — ABNORMAL HIGH (ref 2.5–4.6)

## 2018-01-26 LAB — VALPROIC ACID LEVEL: Valproic Acid Lvl: 54 ug/mL (ref 50.0–100.0)

## 2018-01-26 LAB — TROPONIN I: Troponin I: 0.8 ng/mL (ref <0.03)

## 2018-01-26 LAB — MAGNESIUM: Magnesium: 2.6 mg/dL — ABNORMAL HIGH (ref 1.7–2.4)

## 2018-01-26 MED ORDER — VALPROATE SODIUM 500 MG/5ML IV SOLN
500.0000 mg | Freq: Four times a day (QID) | INTRAVENOUS | Status: DC
  Administered 2018-01-26 – 2018-01-29 (×11): 500 mg via INTRAVENOUS
  Filled 2018-01-26 (×14): qty 5

## 2018-01-26 MED ORDER — DEXTROSE 50 % IV SOLN
INTRAVENOUS | Status: AC
  Administered 2018-01-26: 50 mL
  Filled 2018-01-26: qty 50

## 2018-01-26 NOTE — Procedures (Signed)
LTM-EEG Report  HISTORY: Continuous video-EEG monitoring performed for 67 year old with anoxia, myoclonus.  ACQUISITION: International 10-20 system for electrode placement; 18 channels with additional eyes linked to ipsilateral ears and EKG. Additional T1-T2 electrodes were used. Continuous video recording obtained.   EEG NUMBER:  MEDICATIONS:  Day 1:  See EMR  DAY #1: from 4536 01/21/2018 to 0730 01/26/18   BACKGROUND: An overall low voltage, burst-suppression recording with poor spontaneous variability and absent reactivity. The burst-suppression consisted of low voltage suppression lasting 10-15 seconds with bursts of generalized 1-2 Hz spikes lasting 1-2 seconds. There were no clinical changes with the bursts. Reactivity was not observed.  EPILEPTIFORM/PERIODIC ACTIVITY: Spikes intermixed with bursts as above SEIZURES: no EVENTS: no  EKG: no significant arrhythmia  SUMMARY: This was a markedly abnormal continuous video EEG due to a burst-suppression pattern with generalized spikes in bursts. This was indicative of a severe diffuse cerebral disturbance with widespread cortical irritability. Reactivity was not observed.

## 2018-01-26 NOTE — Progress Notes (Signed)
PULMONARY / CRITICAL CARE MEDICINE   NAME:  Wayne [name], MRN:  [id number], DOB:  1950/12/14, LOS: 1 ADMISSION DATE:  02/10/2018,  REFERRING MD:  , CHIEF COMPLAINT: PEA out of hospital arrest  BRIEF HISTORY:    67 year old diabetic with end-stage renal disease status post out of hospital PEA arrest on 10/11 HISTORY OF PRESENT ILLNESS      67 year old male with known end-stage renal disease who has a past medical history of end-stage renal disease, diabetes mellitus, poor compliance who was on his way to dialysis when he felt funny and vomited and had a PEA arrest.  CPR started almost immediately he was intubated in the field transported to [facility] and noted to have myoclonic jerks once he was stabilized SIGNIFICANT PAST MEDICAL HISTORY   Diabetes, end-stage renal disease on dialysis  SIGNIFICANT EVENTS:   STUDIES:   Echocardiogram shows a decreased LVEF of 45 to 50% and diffuse hypokinesis.  The aortic valve although calcified is not stenotic CULTURES:    ANTIBIOTICS:    LINES/TUBES:   Left femoral A-line placed 10/11.  Left subclavian triple-lumen placed 10/11 CONSULTANTS:  Neurology  SUBJECTIVE:  He remains sedated and actively cooled.  We are at approximately 23 hours of the 24-hour cooling period.  CONSTITUTIONAL: BP (!) 135/49   Pulse (!) 46   Temp (!) 91.4 F (33 C) (Core)   Resp 10   Ht 6\' 1"  (1.854 m)   Wt 68 kg   SpO2 100%   BMI 19.78 kg/m   I/O last 3 completed shifts: In: 1900.9 [I.V.:1416.2; NG/GT:120; IV Piggyback:364.8] Out: 403 [Urine:53; Emesis/NG output:350]  CVP:  [0 mmHg-7 mmHg] 7 mmHg  Vent Mode: PRVC FiO2 (%):  [30 %-100 %] 30 % Set Rate:  [8 bmp-20 bmp] 8 bmp Vt Set:  [630 mL] 630 mL PEEP:  [5 cmH20] 5 cmH20 Plateau Pressure:  [19 cmH20-25 cmH20] 22 cmH20  PHYSICAL EXAM: General: Frail-appearing male who is orally intubated sedated and not interactive neuro: Sedated and pharmacologically paralyzed, pupils are equal at 2 mm, my  eye the EEG is showing a very straightforward burst suppression HEENT: No JVD Cardiovascular: S1 and S2 are regular without murmur rub or gallop Lungs: There is symmetric air movement, no wheezes, no rhonchi Abdomen: The abdomen is flat without any overt organomegaly or masses.  Bowel sounds are absent Musculoskeletal: He is status post left BKA.  There is no edema Skin:    RESOLVED PROBLEM LIST   ASSESSMENT AND PLAN   This is a 67 year old diabetic with end-stage renal disease on dialysis who is suffered from out of hospital PEA arrest.  He had myoclonic jerks on arrival and EEG is showing a burst suppression pattern while he is not on drugs at sufficient doses to induce such a pattern.  I am very concerned about his potential neurological outcome.  Have shared this information with family but explained it to be fair to the patient we need to have him at a normal temperature with all of his sedative agents metabolized before we can make any ultimate prediction as to his neurological outcome.  The provocation for his PEA arrest is not overt he does not have any extreme metabolic derangements associated with his renal failure, his echocardiogram was not terribly impressive, and he is very easy to oxygenate arguing against a massive pulmonary embolism.  Continue supportive care as needed however I suspect that outcome will be quite poor  SUMMARY OF TODAY'S PLAN:  Complete hypothermia protocol.  Wean sedation to off to facilitate neurologic evaluation. No hemodialysis today.  Best Practice / Goals of Care / Disposition.   DVT PROPHYLAXIS: Subcutaneous  heparin SUP: Protonix NUTRITION:  LABS  Glucose Recent Labs  Lab 01/31/2018 2126 02/09/2018 2318 01/26/18 0129 01/26/18 0320 01/26/18 0455 01/26/18 0642  GLUCAP 102* 108* 96 98 99 101*    BMET Recent Labs  Lab 02/09/2018 2347 01/26/18 0125 01/26/18 0500  NA 142 144 142  K 4.4 4.5 4.5  CL 106 109 107  CO2 24 23 24   BUN 38* 40* 42*   CREATININE 11.00* 11.10* 11.02*  GLUCOSE 108* 108* 103*    Liver Enzymes Recent Labs  Lab 02/11/2018 0711  AST 204*  ALT 212*  ALKPHOS 87  BILITOT 0.7  ALBUMIN 3.3*    Electrolytes Recent Labs  Lab 02/11/2018 2347 01/26/18 0125 01/26/18 0500  CALCIUM 8.2* 8.1* 8.0*  MG  --   --  2.6*  PHOS  --   --  5.0*    CBC Recent Labs  Lab 01/27/2018 0711  02/11/2018 1556 01/22/2018 1843 01/26/18 0500  WBC 9.4  --   --   --  5.9  HGB 11.8*   < > 10.9* 9.9* 9.2*  HCT 37.8*   < > 32.0* 29.0* 28.9*  PLT 177  --   --   --  105*   < > = values in this interval not displayed.    ABG Recent Labs  Lab 02/01/2018 1556 01/17/2018 1843 01/26/18 0458  PHART 7.542* 7.547* 7.557*  PCO2ART 30.0* 30.3* 29.3*  PO2ART 96.0 92.0 102.0    Coag's Recent Labs  Lab 02/13/2018 0817 01/28/2018 1623 01/28/2018 1849  APTT 31 33 36  INR 1.09 1.18 1.25    Sepsis Markers Recent Labs  Lab 01/24/2018 0730  LATICACIDVEN 8.01*    Cardiac Enzymes Recent Labs  Lab 01/28/2018 1436 02/12/2018 1939 01/26/18 0125  TROPONINI 0.54* 0.67* 0.80*    PAST MEDICAL HISTORY :   He  has a past medical history of Anemia of chronic disease, ESRD (end stage renal disease) ([place]), Essential hypertension, GERD (gastroesophageal reflux disease), History of kidney stones, Hypercholesteremia, Peripheral vascular disease ([place]), Sarcoidosis, and Type 2 diabetes mellitus ([place]).  PAST SURGICAL HISTORY:  He  has a past surgical history that includes Below knee leg amputation (Left); Amputation of replicated toes (Right, 2010); Esophageal dilation; Eye surgery (Right); AV fistula placement (Left, 12/31/2012); Fistulogram (N/A, 07/27/2014); AV fistula placement (Right, 11/03/2014); Colonoscopy; Colonoscopy (N/A, 02/22/2017); IR Fluoro Guide CV Line Right (07/10/2017); IR US Guide Vasc Access Right (07/10/2017); IR Fluoro Guide CV Line Right (07/11/2017); UPPER EXTREMITY VENOGRAPHY (Right, 08/21/2017); Femoral artery debridement (Right,  08/28/2017); UPPER EXTREMITY VENOGRAPHY (N/A, 10/11/2017); AV fistula placement (Left, 10/30/2017); and IR Removal Tun Cv Cath W/O FL (12/13/2017).  No Known Allergies  No current facility-administered medications on file prior to encounter.    Current Outpatient Medications on File Prior to Encounter  Medication Sig  . acetaminophen (TYLENOL) 650 MG CR tablet Take 650 mg by mouth every 8 (eight) hours as needed for pain.  Marland Kitchen amLODipine (NORVASC) 10 MG tablet Take 10 mg by mouth daily.  Marland Kitchen aspirin EC 81 MG tablet Take 1 tablet (81 mg total) at bedtime by mouth.  Marland Kitchen atorvastatin (LIPITOR) 80 MG tablet Take 40 mg by mouth every evening.   . cloNIDine (CATAPRES - DOSED IN MG/24 HR) 0.2 mg/24hr patch Place 0.2 mg onto the skin once a week.   . hydrALAZINE (APRESOLINE) 25  MG tablet Take 75 mg by mouth 2 (two) times daily.  . metoprolol tartrate (LOPRESSOR) 50 MG tablet Take 50 mg by mouth 2 (two) times daily.  . multivitamin (RENA-VIT) TABS tablet Take 1 tablet by mouth daily.  . sevelamer carbonate (RENVELA) 800 MG tablet Take 800-1,600 mg by mouth See admin instructions. Take 2 tablets (1600 mg) by mouth with meals and 1 tablet (800 mg) with snacks  . sitaGLIPtin (JANUVIA) 100 MG tablet Take 100 mg by mouth at bedtime.   . timolol (TIMOPTIC) 0.5 % ophthalmic solution Place 1 drop into both eyes 2 (two) times daily.    FAMILY HISTORY:   His family history includes Colon cancer in his mother; Diabetes in his mother; Diabetes Mellitus II in his mother.  SOCIAL HISTORY:  He  reports that he quit smoking about 19 years ago. His smoking use included cigarettes. He quit after 20.00 years of use. He has never used smokeless tobacco. He reports that he does not drink alcohol or use drugs.  REVIEW OF SYSTEMS:      Lars Masson, MD  Greater than 35 minutes was spent in the care of this acutely ill patient today

## 2018-01-26 NOTE — Progress Notes (Signed)
Neurology Progress Note   S:// Mains on hypothermia protocol. Sedation and chemical paralysis on board.   O:// Current vital signs: BP (!) 135/49   Pulse (!) 46   Temp (!) 91.4 F (33 C) (Core)   Resp 10   Ht 6' 1"  (1.854 m)   Wt 68 kg   SpO2 100%   BMI 19.78 kg/m  Vital signs in last 24 hours: Temp:  [90.7 F (32.6 C)-91.6 F (33.1 C)] 91.4 F (33 C) (10/12 0741) Pulse Rate:  [44-58] 46 (10/12 0734) Resp:  [0-16] 10 (10/12 0734) BP: (88-185)/(49-79) 135/49 (10/12 0734) SpO2:  [99 %-100 %] 100 % (10/12 0734) Arterial Line BP: (94-204)/(36-65) 135/51 (10/12 0700) FiO2 (%):  [30 %-80 %] 30 % (10/12 0734) Weight:  [68 kg] 68 kg (10/12 0430) Minimal exam due to sedation and paralytics being on board. Barely noticeable pupillary reaction that is equal bilaterally. No spontaneous movements. Not breathing over the vent No cough or gag and no corneal reflexes elicitable at this time  Medications  Current Facility-Administered Medications:  .  0.9 %  sodium chloride infusion, , Intravenous, Continuous, Minor, Grace Bushy, NP, Last Rate: 50 mL/hr at 01/26/18 0900 .  artificial tears (LACRILUBE) ophthalmic ointment 1 application, 1 application, Both Eyes, Q8H, Minor, Grace Bushy, NP, 1 application at 94/07/68 0514 .  chlorhexidine gluconate (MEDLINE KIT) (PERIDEX) 0.12 % solution 15 mL, 15 mL, Mouth Rinse, BID, Sampson Goon, MD, 15 mL at 01/26/18 0800 .  [COMPLETED] cisatracurium (NIMBEX) bolus via infusion 7.3 mg, 0.1 mg/kg, Intravenous, Once, 7.3 mg at 01/20/2018 0900 **AND** cisatracurium (NIMBEX) 200 mg in sodium chloride 0.9 % 200 mL (1 mg/mL) infusion, 1-1.5 mcg/kg/min, Intravenous, Continuous, Last Rate: 5.97 mL/hr at 01/26/18 0700, 1.5 mcg/kg/min at 01/26/18 0700 **AND** cisatracurium (NIMBEX) bolus via infusion 3.6 mg, 0.05 mg/kg, Intravenous, PRN, Minor, Grace Bushy, NP .  clevidipine (CLEVIPREX) infusion 0.5 mg/mL, 2 mg/hr, Intravenous, Continuous, Sampson Goon, MD, Stopped  at 02/06/2018 2008 .  famotidine (PEPCID) IVPB 20 mg premix, 20 mg, Intravenous, Q12H, Minor, Grace Bushy, NP, Stopped at 02/09/2018 2201 .  fentaNYL (SUBLIMAZE) bolus via infusion 25 mcg, 25 mcg, Intravenous, Q30 min PRN, Minor, Grace Bushy, NP .  fentaNYL (SUBLIMAZE) injection 50 mcg, 50 mcg, Intravenous, Once, Minor, Grace Bushy, NP .  fentaNYL 2546mg in NS 2518m(1022mml) infusion-PREMIX, 100-300 mcg/hr, Intravenous, Continuous, Minor, WilGrace BushyP, Last Rate: 10 mL/hr at 01/26/18 0700, 100 mcg/hr at 01/26/18 0700 .  heparin injection 5,000 Units, 5,000 Units, Subcutaneous, Q12H, GraSampson GoonD, 5,000 Units at 01/16/2018 2131 .  insulin aspart (novoLOG) injection 0-9 Units, 0-9 Units, Subcutaneous, Q4H, Minor, WilGrace BushyP, 1 Units at 01/27/2018 1649 .  MEDLINE mouth rinse, 15 mL, Mouth Rinse, 10 times per day, GraSampson GoonD, 15 mL at 01/26/18 0524 .  norepinephrine (LEVOPHED) 4mg42m D5W 250mL52mmix infusion, 0-50 mcg/min, Intravenous, Titrated, Minor, WilliGrace Bushy Last Rate: 7.5 mL/hr at 01/26/18 0900, 2 mcg/min at 01/26/18 0900 .  propofol (DIPRIVAN) 1000 MG/100ML infusion, 25-80 mcg/kg/min, Intravenous, Continuous, Minor, WilliGrace Bushy Last Rate: 13.07 mL/hr at 01/26/18 0700, 30 mcg/kg/min at 01/26/18 0700 .  valproate (DEPACON) 500 mg in dextrose 5 % 50 mL IVPB, 500 mg, Intravenous, Q8H, PickeDavonna Belling Stopped at 01/26/18 0616 Labs CBC    Component Value Date/Time   WBC 5.9 01/26/2018 0500   RBC 3.14 (L) 01/26/2018 0500   HGB 7.5 (L) 01/26/2018 0820   HCT 22.0 (L) 01/26/2018  0820   PLT 105 (L) 01/26/2018 0500   MCV 92.0 01/26/2018 0500   MCH 29.3 01/26/2018 0500   MCHC 31.8 01/26/2018 0500   RDW 13.2 01/26/2018 0500   LYMPHSABS 2.1 02/06/2018 0711   MONOABS 0.7 01/20/2018 0711   EOSABS 0.3 01/19/2018 0711   BASOSABS 0.0 01/24/2018 0711    CMP     Component Value Date/Time   NA 147 (H) 01/26/2018 0820   K 3.5 01/26/2018 0820   CL 107 01/26/2018 0500   CO2 24  01/26/2018 0500   GLUCOSE 88 01/26/2018 0820   BUN 42 (H) 01/26/2018 0500   CREATININE 11.02 (H) 01/26/2018 0500   CALCIUM 8.0 (L) 01/26/2018 0500   PROT 7.0 01/17/2018 0711   ALBUMIN 3.3 (L) 01/28/2018 0711   AST 204 (H) 01/22/2018 0711   ALT 212 (H) 02/08/2018 0711   ALKPHOS 87 02/10/2018 0711   BILITOT 0.7 01/21/2018 0711   GFRNONAA 4 (L) 01/26/2018 0500   GFRAA 5 (L) 01/26/2018 0500   Assessment/Recommendations   67 year old man with post cardiac arrest hypothermia protocol, with myoclonus on presentation requiring sedatives and paralytics who also shows burst suppression on his EEG with sedation on propofol, currently on hypothermia protocol and will be rewarmed.  Myoclonus and burst suppression and EEG early in the course after cardiac arrest usually portends poor prognosis.  Too early for formal prognostication in his case.  We will follow for a formal neurological evaluation 48 to 72 hours after rewarming.  Continue video EEG  I spoke with the wife and daughter at bedside and explained the above.  Please call if concern for seizure/myoclonic activity persist.    -- Amie Portland, MD [facility] Pager: [phone] If 7pm to 7am, please call on call as listed on AMION.

## 2018-01-26 NOTE — Progress Notes (Signed)
Admit: 01/18/2018 LOS: 1  71M ESRD with PEA cardiac arrest  Subjective:  Marland Kitchen Undergoing therapeutic cooling, will rewarm today . Potassium 4.5, BUN 42, bicarbonate 24, hemoglobin 9.2 . Blood pressure stable  10/11 0701 - 10/12 0700 In: 1900.9 [I.V.:1416.2; NG/GT:120; IV Piggyback:364.8] Out: 403 [Urine:53; Emesis/NG output:350]  Filed Weights   02/13/2018 0732 01/26/18 0430  Weight: 72.6 kg 68 kg    Scheduled Meds: . artificial tears  1 application Both Eyes C5Y  . chlorhexidine gluconate (MEDLINE KIT)  15 mL Mouth Rinse BID  . fentaNYL (SUBLIMAZE) injection  50 mcg Intravenous Once  . heparin injection (subcutaneous)  5,000 Units Subcutaneous Q12H  . insulin aspart  0-9 Units Subcutaneous Q4H  . mouth rinse  15 mL Mouth Rinse 10 times per day   Continuous Infusions: . sodium chloride 50 mL/hr at 01/26/18 0900  . cisatracurium (NIMBEX) infusion 1.5 mcg/kg/min (01/26/18 0700)  . clevidipine Stopped (02/11/2018 2008)  . famotidine (PEPCID) IV Stopped (01/18/2018 2201)  . fentaNYL infusion INTRAVENOUS 100 mcg/hr (01/26/18 0700)  . norepinephrine (LEVOPHED) Adult infusion 2 mcg/min (01/26/18 0900)  . propofol (DIPRIVAN) infusion 30 mcg/kg/min (01/26/18 0700)  . valproate sodium Stopped (01/26/18 0616)   PRN Meds:.[COMPLETED] cisatracurium **AND** cisatracurium (NIMBEX) infusion **AND** cisatracurium, fentaNYL  Current Labs: reviewed    Physical Exam:  Blood pressure (!) 135/49, pulse (!) 46, temperature (!) 91.4 F (33 C), temperature source Core, resp. rate 10, height 6' 1"  (1.854 m), weight 68 kg, SpO2 100 %. GEN: Intubated, cool to the touch ENT: NCAT EYES: Eyes closed CV: Regular, normal S1 and S2, no rub PULM: Coarse breath sounds bilaterally ABD: Soft, nontender SKIN: No rashes or lesions, right shin IO catheter in place EXT: No edema; L BKA Stump w/o edema Left upper extremity AV graft with bruit and thrill  A 1. ESRD MWF [facility]  using LUE AVG 2. Prolonged  PEA Cardiac Arrest 3. Anoxic Brain Injury, therapeutic cooling 10/11-10/12  P . No indications for dialysis currently . Given cooling will not treat today, reevaluate tomorrow . Very poor prognosis  Pearson Grippe MD 01/26/2018, 10:19 AM  Recent Labs  Lab 02/10/2018 2347 01/26/18 0125 01/26/18 0500 01/26/18 0820  NA 142 144 142 147*  K 4.4 4.5 4.5 3.5  CL 106 109 107  --   CO2 24 23 24   --   GLUCOSE 108* 108* 103* 88  BUN 38* 40* 42*  --   CREATININE 11.00* 11.10* 11.02*  --   CALCIUM 8.2* 8.1* 8.0*  --   PHOS  --   --  5.0*  --    Recent Labs  Lab 01/20/2018 0711  01/16/2018 1843 01/26/18 0500 01/26/18 0820  WBC 9.4  --   --  5.9  --   NEUTROABS 6.1  --   --   --   --   HGB 11.8*   < > 9.9* 9.2* 7.5*  HCT 37.8*   < > 29.0* 28.9* 22.0*  MCV 97.7  --   --  92.0  --   PLT 177  --   --  105*  --    < > = values in this interval not displayed.

## 2018-01-26 NOTE — Progress Notes (Signed)
EEG LTM maint complete. No skin breakdown. Will continue to monitor

## 2018-01-27 ENCOUNTER — Inpatient Hospital Stay (HOSPITAL_COMMUNITY): Payer: Medicare Other

## 2018-01-27 LAB — GLUCOSE, CAPILLARY
GLUCOSE-CAPILLARY: 100 mg/dL — AB (ref 70–99)
GLUCOSE-CAPILLARY: 109 mg/dL — AB (ref 70–99)
GLUCOSE-CAPILLARY: 69 mg/dL — AB (ref 70–99)
GLUCOSE-CAPILLARY: 70 mg/dL (ref 70–99)
GLUCOSE-CAPILLARY: 71 mg/dL (ref 70–99)
GLUCOSE-CAPILLARY: 72 mg/dL (ref 70–99)
GLUCOSE-CAPILLARY: 73 mg/dL (ref 70–99)
Glucose-Capillary: 81 mg/dL (ref 70–99)
Glucose-Capillary: 78 mg/dL (ref 70–99)
Glucose-Capillary: 39 mg/dL — CL (ref 70–99)
Glucose-Capillary: 96 mg/dL (ref 70–99)
Glucose-Capillary: 56 mg/dL — ABNORMAL LOW (ref 70–99)
Glucose-Capillary: 72 mg/dL (ref 70–99)

## 2018-01-27 LAB — BASIC METABOLIC PANEL
ANION GAP: 12 (ref 5–15)
ANION GAP: 16 — AB (ref 5–15)
Anion gap: 14 (ref 5–15)
Anion gap: 11 (ref 5–15)
BUN: 45 mg/dL — ABNORMAL HIGH (ref 8–23)
BUN: 46 mg/dL — ABNORMAL HIGH (ref 8–23)
BUN: 48 mg/dL — ABNORMAL HIGH (ref 8–23)
BUN: 49 mg/dL — ABNORMAL HIGH (ref 8–23)
CALCIUM: 7 mg/dL — AB (ref 8.9–10.3)
CO2: 23 mmol/L (ref 22–32)
CO2: 24 mmol/L (ref 22–32)
CO2: 22 mmol/L (ref 22–32)
CO2: 21 mmol/L — ABNORMAL LOW (ref 22–32)
Calcium: 7.2 mg/dL — ABNORMAL LOW (ref 8.9–10.3)
Calcium: 7.2 mg/dL — ABNORMAL LOW (ref 8.9–10.3)
Calcium: 7 mg/dL — ABNORMAL LOW (ref 8.9–10.3)
Chloride: 104 mmol/L (ref 98–111)
Chloride: 107 mmol/L (ref 98–111)
Chloride: 107 mmol/L (ref 98–111)
Chloride: 103 mmol/L (ref 98–111)
Creatinine, Ser: 12.28 mg/dL — ABNORMAL HIGH (ref 0.61–1.24)
Creatinine, Ser: 12.41 mg/dL — ABNORMAL HIGH (ref 0.61–1.24)
Creatinine, Ser: 12.61 mg/dL — ABNORMAL HIGH (ref 0.61–1.24)
Creatinine, Ser: 12.99 mg/dL — ABNORMAL HIGH (ref 0.61–1.24)
GFR calc Af Amer: 4 mL/min — ABNORMAL LOW (ref >60)
GFR calc Af Amer: 4 mL/min — ABNORMAL LOW (ref >60)
GFR calc Af Amer: 4 mL/min — ABNORMAL LOW (ref >60)
GFR, EST AFRICAN AMERICAN: 4 mL/min — AB (ref >60)
GFR, EST NON AFRICAN AMERICAN: 4 mL/min — AB (ref >60)
GFR, EST NON AFRICAN AMERICAN: 4 mL/min — AB (ref >60)
GFR, EST NON AFRICAN AMERICAN: 4 mL/min — AB (ref >60)
GFR, EST NON AFRICAN AMERICAN: 3 mL/min — AB (ref >60)
GLUCOSE: 86 mg/dL (ref 70–99)
Glucose, Bld: 52 mg/dL — ABNORMAL LOW (ref 70–99)
Glucose, Bld: 92 mg/dL (ref 70–99)
Glucose, Bld: 81 mg/dL (ref 70–99)
POTASSIUM: 4.6 mmol/L (ref 3.5–5.1)
POTASSIUM: 4.6 mmol/L (ref 3.5–5.1)
POTASSIUM: 4.1 mmol/L (ref 3.5–5.1)
POTASSIUM: 4.5 mmol/L (ref 3.5–5.1)
SODIUM: 142 mmol/L (ref 135–145)
SODIUM: 141 mmol/L (ref 135–145)
SODIUM: 140 mmol/L (ref 135–145)
Sodium: 141 mmol/L (ref 135–145)

## 2018-01-27 LAB — COMPREHENSIVE METABOLIC PANEL
ALBUMIN: 2.3 g/dL — AB (ref 3.5–5.0)
ALT: 74 U/L — ABNORMAL HIGH (ref 0–44)
ANION GAP: 14 (ref 5–15)
AST: 28 U/L (ref 15–41)
Alkaline Phosphatase: 56 U/L (ref 38–126)
BUN: 46 mg/dL — ABNORMAL HIGH (ref 8–23)
CO2: 23 mmol/L (ref 22–32)
Calcium: 7.5 mg/dL — ABNORMAL LOW (ref 8.9–10.3)
Chloride: 106 mmol/L (ref 98–111)
Creatinine, Ser: 12.01 mg/dL — ABNORMAL HIGH (ref 0.61–1.24)
GFR calc Af Amer: 4 mL/min — ABNORMAL LOW (ref >60)
GFR calc non Af Amer: 4 mL/min — ABNORMAL LOW (ref >60)
GLUCOSE: 91 mg/dL (ref 70–99)
POTASSIUM: 4.7 mmol/L (ref 3.5–5.1)
SODIUM: 143 mmol/L (ref 135–145)
TOTAL PROTEIN: 5.1 g/dL — AB (ref 6.5–8.1)
Total Bilirubin: 0.6 mg/dL (ref 0.3–1.2)

## 2018-01-27 LAB — POCT I-STAT 3, ART BLOOD GAS (G3+)
Bicarbonate: 26.3 mmol/L (ref 20.0–28.0)
O2 Saturation: 97 %
PH ART: 7.296 — AB (ref 7.350–7.450)
PO2 ART: 100 mmHg (ref 83.0–108.0)
TCO2: 28 mmol/L (ref 22–32)
pCO2 arterial: 54.1 mmHg — ABNORMAL HIGH (ref 32.0–48.0)

## 2018-01-27 LAB — CBC WITH DIFFERENTIAL/PLATELET
Abs Immature Granulocytes: 0.01 10*3/uL (ref 0.00–0.07)
BASOS PCT: 0 %
Basophils Absolute: 0 10*3/uL (ref 0.0–0.1)
Eosinophils Absolute: 0.4 10*3/uL (ref 0.0–0.5)
Eosinophils Relative: 8 %
HCT: 28.1 % — ABNORMAL LOW (ref 39.0–52.0)
HEMOGLOBIN: 9 g/dL — AB (ref 13.0–17.0)
IMMATURE GRANULOCYTES: 0 %
LYMPHS ABS: 0.3 10*3/uL — AB (ref 0.7–4.0)
LYMPHS PCT: 7 %
MCH: 30.4 pg (ref 26.0–34.0)
MCHC: 32 g/dL (ref 30.0–36.0)
MCV: 94.9 fL (ref 80.0–100.0)
MONOS PCT: 8 %
Monocytes Absolute: 0.4 10*3/uL (ref 0.1–1.0)
NEUTROS ABS: 3.9 10*3/uL (ref 1.7–7.7)
NEUTROS PCT: 77 %
PLATELETS: 91 10*3/uL — AB (ref 150–400)
RBC: 2.96 MIL/uL — ABNORMAL LOW (ref 4.22–5.81)
RDW: 13.5 % (ref 11.5–15.5)
WBC: 5.1 10*3/uL (ref 4.0–10.5)
nRBC: 0 % (ref 0.0–0.2)

## 2018-01-27 LAB — CORTISOL: Cortisol, Plasma: 3.5 ug/dL

## 2018-01-27 LAB — LACTIC ACID, PLASMA: Lactic Acid, Venous: 0.9 mmol/L (ref 0.5–1.9)

## 2018-01-27 LAB — VALPROIC ACID LEVEL: VALPROIC ACID LVL: 64 ug/mL (ref 50.0–100.0)

## 2018-01-27 MED ORDER — DEXTROSE 10 % IV SOLN
INTRAVENOUS | Status: DC
  Administered 2018-01-27 – 2018-01-28 (×2): via INTRAVENOUS

## 2018-01-27 MED ORDER — RENA-VITE PO TABS
1.0000 | ORAL_TABLET | Freq: Every day | ORAL | Status: DC
  Administered 2018-01-27 – 2018-01-28 (×2): 1 via ORAL
  Filled 2018-01-27 (×2): qty 1

## 2018-01-27 MED ORDER — DEXTROSE 50 % IV SOLN
INTRAVENOUS | Status: AC
  Administered 2018-01-27: 50 mL via INTRAVENOUS
  Filled 2018-01-27: qty 50

## 2018-01-27 MED ORDER — DEXTROSE 50 % IV SOLN
INTRAVENOUS | Status: AC
  Administered 2018-01-27: 50 mL
  Filled 2018-01-27: qty 50

## 2018-01-27 MED ORDER — DEXTROSE 50 % IV SOLN
1.0000 | Freq: Once | INTRAVENOUS | Status: AC
  Administered 2018-01-27: 50 mL via INTRAVENOUS

## 2018-01-27 MED ORDER — DEXTROSE 50 % IV SOLN
INTRAVENOUS | Status: AC
  Filled 2018-01-27: qty 50

## 2018-01-27 MED ORDER — CHLORHEXIDINE GLUCONATE CLOTH 2 % EX PADS
6.0000 | MEDICATED_PAD | Freq: Every day | CUTANEOUS | Status: DC
  Administered 2018-01-27 – 2018-01-28 (×3): 6 via TOPICAL

## 2018-01-27 MED ORDER — VALPROATE SODIUM 500 MG/5ML IV SOLN
500.0000 mg | Freq: Once | INTRAVENOUS | Status: AC
  Administered 2018-01-27: 500 mg via INTRAVENOUS
  Filled 2018-01-27: qty 5

## 2018-01-27 MED ORDER — HYDROCORTISONE NA SUCCINATE PF 100 MG IJ SOLR
50.0000 mg | Freq: Four times a day (QID) | INTRAMUSCULAR | Status: DC
  Administered 2018-01-27 – 2018-01-29 (×8): 50 mg via INTRAVENOUS
  Filled 2018-01-27 (×8): qty 2

## 2018-01-27 NOTE — Progress Notes (Addendum)
Neurology Progress Note   S:// Seen and examined.  Concern for seizure activity overnight. Rewarmed at 10:45 PM on 01/26/2018. Some myoclonus observed clinically for which he was given Depakote Overnight EEG markedly abnormal with burst suppression pattern and generalized spikes and bursts indicative of severe diffuse cerebral dysrhythmias with widespread cortical irritability with no reactivity. Less burst suppression with rewarming but still present.  O:// Current vital signs: BP (!) 103/48   Pulse 78   Temp (!) 97 F (36.1 C) (Bladder)   Resp (!) 8   Ht 6' 1"  (1.854 m)   Wt 69.1 kg   SpO2 98%   BMI 20.10 kg/m  Vital signs in last 24 hours: Temp:  [91.6 F (33.1 C)-99 F (37.2 C)] 97 F (36.1 C) (10/13 0800) Pulse Rate:  [48-82] 78 (10/13 0800) Resp:  [8-18] 8 (10/13 0800) BP: (92-157)/(43-64) 103/48 (10/13 0800) SpO2:  [98 %-100 %] 98 % (10/13 0800) Arterial Line BP: (108-169)/(37-57) 112/41 (10/13 0800) FiO2 (%):  [30 %] 30 % (10/13 0721) Weight:  [69.1 kg] 69.1 kg (10/13 0445) General: Sedated on 50/hour of propofol and 100/hour of fentanyl.  No paralytics on board today. HEENT: Normocephalic atraumatic Respiratory: Vented Abdomen: Nondistended nontender Extremities: Amputation of toes on right foot Neurological exam No spontaneous movements Barely breathing over the ventilator Absent corneals Sluggish of any pupillary response bilaterally No cough or gag No movement of extremities to noxious stim No change in the EEG on noxious stimulation  Medications  Current Facility-Administered Medications:  .  0.9 %  sodium chloride infusion, , Intravenous, Continuous, Minor, Grace Bushy, NP, Last Rate: 50 mL/hr at 01/27/18 0700 .  artificial tears (LACRILUBE) ophthalmic ointment 1 application, 1 application, Both Eyes, Q8H, Minor, Grace Bushy, NP, 1 application at 83/66/29 0455 .  chlorhexidine gluconate (MEDLINE KIT) (PERIDEX) 0.12 % solution 15 mL, 15 mL, Mouth Rinse,  BID, Sampson Goon, MD, 15 mL at 01/27/18 0821 .  clevidipine (CLEVIPREX) infusion 0.5 mg/mL, 2 mg/hr, Intravenous, Continuous, Sampson Goon, MD, Stopped at 01/28/2018 2008 .  famotidine (PEPCID) IVPB 20 mg premix, 20 mg, Intravenous, Q12H, Minor, Grace Bushy, NP, Stopped at 01/26/18 2133 .  fentaNYL (SUBLIMAZE) bolus via infusion 25 mcg, 25 mcg, Intravenous, Q30 min PRN, Minor, Grace Bushy, NP .  fentaNYL (SUBLIMAZE) injection 50 mcg, 50 mcg, Intravenous, Once, Minor, Grace Bushy, NP .  fentaNYL 2557mg in NS 259m(1079mml) infusion-PREMIX, 100-300 mcg/hr, Intravenous, Continuous, Minor, WilGrace BushyP, Last Rate: 10 mL/hr at 01/27/18 0700, 100 mcg/hr at 01/27/18 0700 .  heparin injection 5,000 Units, 5,000 Units, Subcutaneous, Q12H, GraSampson GoonD, 5,000 Units at 01/26/18 2103 .  insulin aspart (novoLOG) injection 0-9 Units, 0-9 Units, Subcutaneous, Q4H, Minor, WilGrace BushyP, 1 Units at 01/17/2018 1649 .  MEDLINE mouth rinse, 15 mL, Mouth Rinse, 10 times per day, GraSampson GoonD, 15 mL at 01/27/18 0510 .  multivitamin (RENA-VIT) tablet 1 tablet, 1 tablet, Oral, QHS, GraSampson GoonD .  norepinephrine (LEVOPHED) 4mg72m D5W 250mL14mmix infusion, 0-50 mcg/min, Intravenous, Titrated, Minor, WilliGrace Bushy Last Rate: 16.88 mL/hr at 01/27/18 0700, 4.5 mcg/min at 01/27/18 0700 .  propofol (DIPRIVAN) 1000 MG/100ML infusion, 25-80 mcg/kg/min, Intravenous, Continuous, Minor, WilliGrace Bushy Last Rate: 26.1 mL/hr at 01/27/18 0838, 60 mcg/kg/min at 01/27/18 0838 .  valproate (DEPACON) 500 mg in dextrose 5 % 50 mL IVPB, 500 mg, Intravenous, Q6H, Gray,Sampson Goon Stopped at 01/27/18 0610 Labs CBC    Component Value  Date/Time   WBC 5.1 01/27/2018 0103   RBC 2.96 (L) 01/27/2018 0103   HGB 9.0 (L) 01/27/2018 0103   HCT 28.1 (L) 01/27/2018 0103   PLT 91 (L) 01/27/2018 0103   MCV 94.9 01/27/2018 0103   MCH 30.4 01/27/2018 0103   MCHC 32.0 01/27/2018 0103   RDW 13.5 01/27/2018 0103   LYMPHSABS 0.3 (L)  01/27/2018 0103   MONOABS 0.4 01/27/2018 0103   EOSABS 0.4 01/27/2018 0103   BASOSABS 0.0 01/27/2018 0103    CMP     Component Value Date/Time   NA 141 01/27/2018 0811   K 4.6 01/27/2018 0811   CL 104 01/27/2018 0811   CO2 23 01/27/2018 0811   GLUCOSE 86 01/27/2018 0811   BUN 45 (H) 01/27/2018 0811   CREATININE 12.28 (H) 01/27/2018 0811   CALCIUM 7.2 (L) 01/27/2018 0811   PROT 5.1 (L) 01/27/2018 0103   ALBUMIN 2.3 (L) 01/27/2018 0103   AST 28 01/27/2018 0103   ALT 74 (H) 01/27/2018 0103   ALKPHOS 56 01/27/2018 0103   BILITOT 0.6 01/27/2018 0103   GFRNONAA 4 (L) 01/27/2018 0811   GFRAA 4 (L) 01/27/2018 0811   Imaging CT head on 01/22/2018 with no acute changes.  Assessment:  67 year old man status post cardiac arrest who was on hypothermia protocol and rewarmed on 10:45 PM on 01/26/2018 with myoclonus on presentation requiring sedatives and paralytics and initial EEG showing burst suppression pattern, currently off of paralytics and having had to be being rewarmed continued to have some myoclonic activity overnight for which she was given Depakote. Myoclonus and burst suppression such early in the course of post cardiac arrest portends poor prognosis. Formal prognostication not possible for at least 48 to 72 hours after rewarming, but his clinical exam, myoclonus and EEG pattern is suggestive of severe anoxic brain injury  Impression: Post cardiac arrest-severe anoxic brain injury  Recommendations: Supportive management per primary team as you are Continue with video EEG If he continues to have more myoclonic movements, other than Depakote, consider Keppra. For benzodiazepine - Klonopin might be most effective in controlling myoclonus. If there is concern for seizure activity or status epilepticus, please call neurology and he can be loaded with Dilantin or Keppra at that time as well.  I updated the patient's daughter who I ran into in the hallway.  No family was present at  the bedside this morning when I examined him.  Neurology will follow with you.   -- Amie Portland, MD [facility] Pager: [phone] If 7pm to 7am, please call on call as listed on AMION.   CRITICAL CARE ATTESTATION This patient is critically ill and at significant risk of neurological worsening, death and care requires constant monitoring of vital signs, hemodynamics, respiratory, and cardiac monitoring. I spent 25  minutes of neurocritical care time performing neurological assessment, discussion with family, other specialists and medical decision making of high complexity in the care of  this patient.

## 2018-01-27 NOTE — Progress Notes (Signed)
Hypoglycemic Event  CBG: 56  Treatment: amp of D50  Symptoms: none, patient unresponsive  Follow-up CBG: Time: 1530 CBG Result:100  Possible Reasons for Event: post cardiac arrest  Comments/MD notified: Dr. Pearline Cables notified    Clide Dales

## 2018-01-27 NOTE — Progress Notes (Signed)
Hypoglycemic Event  CBG: 39  Treatment: amp of D50  Symptoms: none, patient unresponsive  Follow-up CBG: Time:1200 CBG Result:96   Possible Reasons for Event: post-cardiac arrest  Comments/MD notified: Dr. Pearline Cables notified    Clide Dales

## 2018-01-27 NOTE — Progress Notes (Signed)
PULMONARY / CRITICAL CARE MEDICINE   NAME:  Wayne [name], MRN:  [id number], DOB:  1951/03/18, LOS: 2 ADMISSION DATE:  01/19/2018,  REFERRING MD:  , CHIEF COMPLAINT: PEA out of hospital arrest  BRIEF HISTORY:    67 year old diabetic with end-stage renal disease status post out of hospital PEA arrest on 10/11 HISTORY OF PRESENT ILLNESS      67 year old male with known end-stage renal disease who has a past medical history of end-stage renal disease, diabetes mellitus, poor compliance who was on his way to dialysis when he felt funny and vomited and had a PEA arrest.  CPR started almost immediately he was intubated in the field transported to [facility] and noted to have myoclonic jerks once he was stabilized SIGNIFICANT PAST MEDICAL HISTORY   Diabetes, end-stage renal disease on dialysis  SIGNIFICANT EVENTS:   STUDIES:   Echocardiogram shows a decreased LVEF of 45 to 50% and diffuse hypokinesis.  The aortic valve although calcified is not stenotic CULTURES:    ANTIBIOTICS:    LINES/TUBES:   Left femoral A-line placed 10/11.  Left subclavian triple-lumen placed 10/11 CONSULTANTS:  Neurology  SUBJECTIVE:  Rewarmed and off sedation. No interaction.  On 50 mcg of propofol and 100 mcg of fentanyl at the time of my examination today and a low-dose of levo fed.  CONSTITUTIONAL: BP (!) 103/48   Pulse 78   Temp (!) 97 F (36.1 C) (Bladder)   Resp (!) 8   Ht 6\' 1"  (1.854 m)   Wt 69.1 kg   SpO2 98%   BMI 20.10 kg/m   I/O last 3 completed shifts: In: 3682.1 [I.V.:2969.6; NG/GT:180; IV Piggyback:532.5] Out: 615 [Urine:65; Emesis/NG output:550]  CVP:  [0 mmHg-8 mmHg] 4 mmHg  Vent Mode: PRVC FiO2 (%):  [30 %] 30 % Set Rate:  [8 bmp] 8 bmp Vt Set:  [630 mL] 630 mL PEEP:  [5 cmH20] 5 cmH20 Plateau Pressure:  [16 cmH20-23 cmH20] 16 cmH20  PHYSICAL EXAM: General: Ill-appearing male who is orally intubated and not at all interactive.  Is in no distress. NEURO: There is no  response to voice or noxious stimuli.  Pupils are 2 mm EOMs are absent by doll's eyes corneals are absent gag is absent.  He does not breathe above the set ventilator rate.  He continues to show a burst suppression pattern on EEG  HEENT: No JVD Cardiovascular: S1 and S2 are regular with a 2 out of 6 systolic ejection murmur.  No rub. Lungs: He does not breathe above the set ventilator rate.  There is symmetric air movement, no wheezes, no rhonchi  Abdomen: The abdomen is flat without any overt organomegaly masses or tenderness   Bowel sounds are absent Musculoskeletal: He is status post left BKA.  There is no edema Skin:    RESOLVED PROBLEM LIST   ASSESSMENT AND PLAN   This is a 67 year old diabetic with end-stage renal disease on dialysis who has suffered from out of hospital PEA arrest on 10/11 Therapeutic hypothermia protocol has been completed. Neurologic status is extremely poor.  I am limiting all drugs with any potential for sedation to facilitate a neurological examination. Am aware the patient's thrombocytopenia, I have sent a HIT antibody but doubt that is the etiology I am more suspicious of his valproic acid.  We will have to substitute agents if the trend progresses.  There is no evidence of overwhelming infection accounting for the thrombocytopenia. He does have end-stage renal disease but no acute  intervention is required as of today. Ischemic control is good I have corrected his respiratory acidosis by increasing ventilator rate.  Made family aware that the prospects for meaningful neurological recovery are somewhat grim  SUMMARY OF TODAY'S PLAN:    Best Practice / Goals of Care / Disposition.   DVT PROPHYLAXIS: Subcutaneous  heparin SUP: Protonix NUTRITION:  LABS  Glucose Recent Labs  Lab 01/26/18 2317 01/27/18 0014 01/27/18 0401 01/27/18 0446 01/27/18 0514 01/27/18 0814  GLUCAP 60* 109* 69* 70 81 78    BMET Recent Labs  Lab 01/26/18 2320 01/27/18 0103  01/27/18 0811  NA 142 143 141  K 4.4 4.7 4.6  CL 108 106 104  CO2 23 23 23   BUN 45* 46* 45*  CREATININE 11.46* 12.01* 12.28*  GLUCOSE 67* 91 86    Liver Enzymes Recent Labs  Lab 01/30/2018 0711 01/27/18 0103  AST 204* 28  ALT 212* 74*  ALKPHOS 87 56  BILITOT 0.7 0.6  ALBUMIN 3.3* 2.3*    Electrolytes Recent Labs  Lab 01/26/18 0500  01/26/18 2320 01/27/18 0103 01/27/18 0811  CALCIUM 8.0*   < > 7.3* 7.5* 7.2*  MG 2.6*  --   --   --   --   PHOS 5.0*  --   --   --   --    < > = values in this interval not displayed.    CBC Recent Labs  Lab 02/02/2018 0711  01/26/18 0500  01/26/18 1224 01/26/18 1624 01/27/18 0103  WBC 9.4  --  5.9  --   --   --  5.1  HGB 11.8*   < > 9.2*   < > 8.5* 9.2* 9.0*  HCT 37.8*   < > 28.9*   < > 25.0* 27.0* 28.1*  PLT 177  --  105*  --   --   --  91*   < > = values in this interval not displayed.    ABG Recent Labs  Lab 01/26/18 1219 01/26/18 1947 01/27/18 0404  PHART 7.498* 7.401 7.296*  PCO2ART 30.9* 41.8 54.1*  PO2ART 93.0 102.0 100.0    Coag's Recent Labs  Lab 02/10/2018 0817 01/21/2018 1623 02/09/2018 1849  APTT 31 33 36  INR 1.09 1.18 1.25    Sepsis Markers Recent Labs  Lab 01/28/2018 0730 01/27/18 0359  LATICACIDVEN 8.01* 0.9    Cardiac Enzymes Recent Labs  Lab 01/24/2018 1436 02/04/2018 1939 01/26/18 0125  TROPONINI 0.54* 0.67* 0.80*    PAST MEDICAL HISTORY :   He  has a past medical history of Anemia of chronic disease, ESRD (end stage renal disease) ([place]), Essential hypertension, GERD (gastroesophageal reflux disease), History of kidney stones, Hypercholesteremia, Peripheral vascular disease ([place]), Sarcoidosis, and Type 2 diabetes mellitus ([place]).  PAST SURGICAL HISTORY:  He  has a past surgical history that includes Below knee leg amputation (Left); Amputation of replicated toes (Right, 2010); Esophageal dilation; Eye surgery (Right); AV fistula placement (Left, 12/31/2012); Fistulogram (N/A, 07/27/2014); AV  fistula placement (Right, 11/03/2014); Colonoscopy; Colonoscopy (N/A, 02/22/2017); IR Fluoro Guide CV Line Right (07/10/2017); IR US Guide Vasc Access Right (07/10/2017); IR Fluoro Guide CV Line Right (07/11/2017); UPPER EXTREMITY VENOGRAPHY (Right, 08/21/2017); Femoral artery debridement (Right, 08/28/2017); UPPER EXTREMITY VENOGRAPHY (N/A, 10/11/2017); AV fistula placement (Left, 10/30/2017); and IR Removal Tun Cv Cath W/O FL (12/13/2017).  No Known Allergies  No current facility-administered medications on file prior to encounter.    Current Outpatient Medications on File Prior to Encounter  Medication Sig  .  acetaminophen (TYLENOL) 650 MG CR tablet Take 650 mg by mouth every 8 (eight) hours as needed for pain.  Marland Kitchen amLODipine (NORVASC) 10 MG tablet Take 10 mg by mouth daily.  Marland Kitchen aspirin EC 81 MG tablet Take 1 tablet (81 mg total) at bedtime by mouth.  Marland Kitchen atorvastatin (LIPITOR) 80 MG tablet Take 40 mg by mouth every evening.   . cloNIDine (CATAPRES - DOSED IN MG/24 HR) 0.2 mg/24hr patch Place 0.2 mg onto the skin once a week.   . hydrALAZINE (APRESOLINE) 25 MG tablet Take 75 mg by mouth 2 (two) times daily.  . metoprolol tartrate (LOPRESSOR) 50 MG tablet Take 50 mg by mouth 2 (two) times daily.  . multivitamin (RENA-VIT) TABS tablet Take 1 tablet by mouth daily.  . sevelamer carbonate (RENVELA) 800 MG tablet Take 800-1,600 mg by mouth See admin instructions. Take 2 tablets (1600 mg) by mouth with meals and 1 tablet (800 mg) with snacks  . sitaGLIPtin (JANUVIA) 100 MG tablet Take 100 mg by mouth at bedtime.   . timolol (TIMOPTIC) 0.5 % ophthalmic solution Place 1 drop into both eyes 2 (two) times daily.    FAMILY HISTORY:   His family history includes Colon cancer in his mother; Diabetes in his mother; Diabetes Mellitus II in his mother.  SOCIAL HISTORY:  He  reports that he quit smoking about 19 years ago. His smoking use included cigarettes. He quit after 20.00 years of use. He has never used smokeless  tobacco. He reports that he does not drink alcohol or use drugs.  REVIEW OF SYSTEMS:      Lars Masson, MD  Greater than 35 minutes was spent in the care of this acutely ill patient today

## 2018-01-27 NOTE — Progress Notes (Signed)
Admit: 02/07/2018 LOS: 2  48M ESRD with PEA cardiac arrest  Subjective:  Marland Kitchen Underwent rewarming . Potassium 4.6, bicarbonate 24, BUN 46 . Blood pressure stable  10/12 0701 - 10/13 0700 In: 2544.7 [I.V.:2076.9; NG/GT:90; IV Piggyback:377.8] Out: 405 [Urine:55; Emesis/NG output:350]  Filed Weights   01/16/2018 0732 01/26/18 0430 01/27/18 0445  Weight: 72.6 kg 68 kg 69.1 kg    Scheduled Meds: . artificial tears  1 application Both Eyes H8I  . chlorhexidine gluconate (MEDLINE KIT)  15 mL Mouth Rinse BID  . dextrose  1 ampule Intravenous Once  . dextrose      . fentaNYL (SUBLIMAZE) injection  50 mcg Intravenous Once  . heparin injection (subcutaneous)  5,000 Units Subcutaneous Q12H  . insulin aspart  0-9 Units Subcutaneous Q4H  . mouth rinse  15 mL Mouth Rinse 10 times per day  . multivitamin  1 tablet Oral QHS   Continuous Infusions: . sodium chloride 50 mL/hr at 01/27/18 0953  . clevidipine Stopped (02/13/2018 2008)  . famotidine (PEPCID) IV 20 mg (01/27/18 1017)  . norepinephrine (LEVOPHED) Adult infusion 7 mcg/min (01/27/18 0900)  . propofol (DIPRIVAN) infusion 60 mcg/kg/min (01/27/18 0900)  . valproate sodium Stopped (01/27/18 0610)   PRN Meds:.fentaNYL  Current Labs: reviewed    Physical Exam:  Blood pressure (!) 155/47, pulse 79, temperature 98.6 F (37 C), resp. rate 13, height 6' 1"  (1.854 m), weight 69.1 kg, SpO2 100 %. GEN: Intubated, cool to the touch ENT: NCAT EYES: Eyes closed CV: Regular, normal S1 and S2, no rub PULM: Coarse breath sounds bilaterally ABD: Soft, nontender SKIN: No rashes or lesions, right shin IO catheter in place EXT: No edema; L BKA Stump w/o edema Left upper extremity AV graft with bruit and thrill  A 1. ESRD MWF [facility] using LUE AVG 2. Prolonged PEA Cardiac Arrest 02/07/2018 3. Anoxic Brain Injury, therapeutic cooling 10/11-10/12; dismal prognosis  P . We will continue dialysis pending prognostication by neurology, I  anticipate that his devastating neurological injury will be an indication to discontinue long-term hemodialysis . HD tomorrow: 2K, AVG, No heparin, 2L UF, 3.5h  Pearson Grippe MD 01/27/2018, 11:35 AM  Recent Labs  Lab 01/26/18 0500  01/27/18 0103 01/27/18 0811 01/27/18 1018  NA 142   < > 143 141 142  K 4.5   < > 4.7 4.6 4.6  CL 107   < > 106 104 107  CO2 24   < > 23 23 24   GLUCOSE 103*   < > 91 86 52*  BUN 42*   < > 46* 45* 46*  CREATININE 11.02*   < > 12.01* 12.28* 12.41*  CALCIUM 8.0*   < > 7.5* 7.2* 7.2*  PHOS 5.0*  --   --   --   --    < > = values in this interval not displayed.   Recent Labs  Lab 01/16/2018 0711  01/26/18 0500  01/26/18 1224 01/26/18 1624 01/27/18 0103  WBC 9.4  --  5.9  --   --   --  5.1  NEUTROABS 6.1  --   --   --   --   --  3.9  HGB 11.8*   < > 9.2*   < > 8.5* 9.2* 9.0*  HCT 37.8*   < > 28.9*   < > 25.0* 27.0* 28.1*  MCV 97.7  --  92.0  --   --   --  94.9  PLT 177  --  105*  --   --   --  91*   < > = values in this interval not displayed.

## 2018-01-27 NOTE — Procedures (Signed)
LTM-EEG Report  HISTORY: Continuous video-EEG monitoring performed for 67 year old with anoxia, myoclonus.  ACQUISITION: International 10-20 system for electrode placement; 18 channels with additional eyes linked to ipsilateral ears and EKG. Additional T1-T2 electrodes were used. Continuous video recording obtained.   EEG NUMBER:  MEDICATIONS:  Day 2:  See EMR  DAY #2: from 0730 01/26/18 to 0730 01/27/18   BACKGROUND: An overall low voltage, burst-suppression recording with poor spontaneous variability and absent reactivity. The burst-suppression consisted of low voltage suppression lasting 10-15 seconds initially, later every 2-5 seconds with re-warming. Bursts of generalized 1-2 Hz spikes lasting 1-2 seconds were observed without clinical changes. Reactivity was not observed.  EPILEPTIFORM/PERIODIC ACTIVITY: Spikes intermixed with bursts as above SEIZURES: no EVENTS: no  EKG: no significant arrhythmia  SUMMARY: This was a markedly abnormal continuous video EEG due to a burst-suppression pattern with generalized spikes in bursts. This was indicative of a severe diffuse cerebral disturbance with widespread cortical irritability. Reactivity was not observed. There was less but still ongoing suppression with re-warming.

## 2018-01-27 NOTE — Progress Notes (Signed)
125cc of Fentanyl 44mcg:ml wasted in sink. Witnessed by Idelia Salm, RN.

## 2018-01-28 DIAGNOSIS — Z992 Dependence on renal dialysis: Secondary | ICD-10-CM

## 2018-01-28 DIAGNOSIS — Z7189 Other specified counseling: Secondary | ICD-10-CM

## 2018-01-28 DIAGNOSIS — E440 Moderate protein-calorie malnutrition: Secondary | ICD-10-CM

## 2018-01-28 DIAGNOSIS — N186 End stage renal disease: Secondary | ICD-10-CM

## 2018-01-28 DIAGNOSIS — J9601 Acute respiratory failure with hypoxia: Secondary | ICD-10-CM

## 2018-01-28 LAB — POCT I-STAT 3, ART BLOOD GAS (G3+)
ACID-BASE DEFICIT: 5 mmol/L — AB (ref 0.0–2.0)
Bicarbonate: 20.2 mmol/L (ref 20.0–28.0)
O2 SAT: 93 %
PH ART: 7.332 — AB (ref 7.350–7.450)
PO2 ART: 71 mmHg — AB (ref 83.0–108.0)
Patient temperature: 98.6
TCO2: 21 mmol/L — AB (ref 22–32)
pCO2 arterial: 38.2 mmHg (ref 32.0–48.0)

## 2018-01-28 LAB — CBC WITH DIFFERENTIAL/PLATELET
Abs Immature Granulocytes: 0.02 K/uL (ref 0.00–0.07)
Basophils Absolute: 0 K/uL (ref 0.0–0.1)
Basophils Relative: 0 %
Eosinophils Absolute: 0 K/uL (ref 0.0–0.5)
Eosinophils Relative: 0 %
HCT: 27.8 % — ABNORMAL LOW (ref 39.0–52.0)
Hemoglobin: 8.6 g/dL — ABNORMAL LOW (ref 13.0–17.0)
Immature Granulocytes: 1 %
Lymphocytes Relative: 4 %
Lymphs Abs: 0.2 K/uL — ABNORMAL LOW (ref 0.7–4.0)
MCH: 29.6 pg (ref 26.0–34.0)
MCHC: 30.9 g/dL (ref 30.0–36.0)
MCV: 95.5 fL (ref 80.0–100.0)
Monocytes Absolute: 0.4 K/uL (ref 0.1–1.0)
Monocytes Relative: 10 %
Neutro Abs: 3.7 K/uL (ref 1.7–7.7)
Neutrophils Relative %: 85 %
Platelets: 77 K/uL — ABNORMAL LOW (ref 150–400)
RBC: 2.91 MIL/uL — ABNORMAL LOW (ref 4.22–5.81)
RDW: 13.2 % (ref 11.5–15.5)
WBC: 4.3 K/uL (ref 4.0–10.5)
nRBC: 0 % (ref 0.0–0.2)

## 2018-01-28 LAB — GLUCOSE, CAPILLARY
GLUCOSE-CAPILLARY: 106 mg/dL — AB (ref 70–99)
GLUCOSE-CAPILLARY: 83 mg/dL (ref 70–99)
Glucose-Capillary: 104 mg/dL — ABNORMAL HIGH (ref 70–99)
Glucose-Capillary: 110 mg/dL — ABNORMAL HIGH (ref 70–99)
Glucose-Capillary: 120 mg/dL — ABNORMAL HIGH (ref 70–99)
Glucose-Capillary: 143 mg/dL — ABNORMAL HIGH (ref 70–99)
Glucose-Capillary: 179 mg/dL — ABNORMAL HIGH (ref 70–99)
Glucose-Capillary: 94 mg/dL (ref 70–99)

## 2018-01-28 LAB — COMPREHENSIVE METABOLIC PANEL WITH GFR
ALT: 43 U/L (ref 0–44)
AST: 28 U/L (ref 15–41)
Albumin: 2.3 g/dL — ABNORMAL LOW (ref 3.5–5.0)
Alkaline Phosphatase: 60 U/L (ref 38–126)
Anion gap: 14 (ref 5–15)
BUN: 55 mg/dL — ABNORMAL HIGH (ref 8–23)
CO2: 20 mmol/L — ABNORMAL LOW (ref 22–32)
Calcium: 6.8 mg/dL — ABNORMAL LOW (ref 8.9–10.3)
Chloride: 104 mmol/L (ref 98–111)
Creatinine, Ser: 13.45 mg/dL — ABNORMAL HIGH (ref 0.61–1.24)
GFR calc Af Amer: 4 mL/min — ABNORMAL LOW (ref >60)
GFR calc non Af Amer: 3 mL/min — ABNORMAL LOW (ref >60)
Glucose, Bld: 101 mg/dL — ABNORMAL HIGH (ref 70–99)
Potassium: 5 mmol/L (ref 3.5–5.1)
Sodium: 138 mmol/L (ref 135–145)
Total Bilirubin: 0.7 mg/dL (ref 0.3–1.2)
Total Protein: 4.9 g/dL — ABNORMAL LOW (ref 6.5–8.1)

## 2018-01-28 LAB — BASIC METABOLIC PANEL WITH GFR
Anion gap: 12 (ref 5–15)
BUN: 53 mg/dL — ABNORMAL HIGH (ref 8–23)
CO2: 22 mmol/L (ref 22–32)
Calcium: 6.9 mg/dL — ABNORMAL LOW (ref 8.9–10.3)
Chloride: 106 mmol/L (ref 98–111)
Creatinine, Ser: 13.36 mg/dL — ABNORMAL HIGH (ref 0.61–1.24)
GFR calc Af Amer: 4 mL/min — ABNORMAL LOW (ref >60)
GFR calc non Af Amer: 3 mL/min — ABNORMAL LOW (ref >60)
Glucose, Bld: 95 mg/dL (ref 70–99)
Potassium: 4.9 mmol/L (ref 3.5–5.1)
Sodium: 140 mmol/L (ref 135–145)

## 2018-01-28 LAB — MAGNESIUM: MAGNESIUM: 1.8 mg/dL (ref 1.7–2.4)

## 2018-01-28 LAB — TRIGLYCERIDES: Triglycerides: 108 mg/dL (ref <150)

## 2018-01-28 LAB — PHOSPHORUS: PHOSPHORUS: 5.6 mg/dL — AB (ref 2.5–4.6)

## 2018-01-28 MED ORDER — VITAL HIGH PROTEIN PO LIQD
1000.0000 mL | ORAL | Status: DC
  Administered 2018-01-28: 1000 mL

## 2018-01-28 MED ORDER — FAMOTIDINE IN NACL 20-0.9 MG/50ML-% IV SOLN
20.0000 mg | INTRAVENOUS | Status: DC
  Administered 2018-01-29: 20 mg via INTRAVENOUS
  Filled 2018-01-28: qty 50

## 2018-01-28 MED ORDER — SODIUM CHLORIDE 0.9 % IV SOLN: 100.0000 mL | INTRAVENOUS | Status: DC | PRN

## 2018-01-28 MED ORDER — LIDOCAINE-PRILOCAINE 2.5-2.5 % EX CREA
1.0000 "application " | TOPICAL_CREAM | CUTANEOUS | Status: DC | PRN
  Filled 2018-01-28: qty 5

## 2018-01-28 MED ORDER — HEPARIN SODIUM (PORCINE) 1000 UNIT/ML DIALYSIS: 1000.0000 [IU] | INTRAMUSCULAR | Status: DC | PRN

## 2018-01-28 MED ORDER — PENTAFLUOROPROP-TETRAFLUOROETH EX AERO: 1.0000 "application " | INHALATION_SPRAY | CUTANEOUS | Status: DC | PRN

## 2018-01-28 MED ORDER — LIDOCAINE HCL (PF) 1 % IJ SOLN: 5.0000 mL | INTRAMUSCULAR | Status: DC | PRN

## 2018-01-28 MED ORDER — PRO-STAT SUGAR FREE PO LIQD
60.0000 mL | Freq: Two times a day (BID) | ORAL | Status: DC
  Administered 2018-01-28 – 2018-01-29 (×3): 60 mL
  Filled 2018-01-28 (×3): qty 60

## 2018-01-28 MED ORDER — ALTEPLASE 2 MG IJ SOLR: 2.0000 mg | Freq: Once | INTRAMUSCULAR | Status: DC | PRN

## 2018-01-28 NOTE — Progress Notes (Signed)
I received a request to visit with patient and family following their meeting with the physician. I visited the patient with his wife, sister, and sister-in-law present. I provided spiritual support by offering words of encouragement and led in prayer. I shared that the chaplain is available for additional support as needed or requested.    01/28/18 1100  Clinical Encounter Type  Visited With Patient and family together  Visit Type Spiritual support  Referral From Nurse  Consult/Referral To Chaplain  Spiritual Encounters  Spiritual Needs Prayer;Emotional    Chaplain Dr Redgie Grayer

## 2018-01-28 NOTE — Progress Notes (Signed)
EEG maint complete. No skin breakdown/ continue to monitor

## 2018-01-28 NOTE — Progress Notes (Signed)
Patient removed from continuous EEG per Theodosia Paling, PA for MRI. Will call on-call EEG tech once MRI complete to have patient put back on continuous EEG.

## 2018-01-28 NOTE — Progress Notes (Signed)
PULMONARY / CRITICAL CARE MEDICINE   NAME:  Wayne [name], MRN:  [id number], DOB:  04/24/1950, LOS: 3 ADMISSION DATE:  01/21/2018,  REFERRING MD:  , CHIEF COMPLAINT: PEA out of hospital arrest  BRIEF HISTORY:    67 year old diabetic with end-stage renal disease status post out of hospital PEA arrest on 10/11  HISTORY OF PRESENT ILLNESS      67 year old male with known end-stage renal disease who has a past medical history of end-stage renal disease, diabetes mellitus, poor compliance who was on his way to dialysis when he felt funny and vomited and had a PEA arrest.  CPR started almost immediately he was intubated in the field transported to [facility] and noted to have myoclonic jerks once he was stabilized  SIGNIFICANT PAST MEDICAL HISTORY   Diabetes, end-stage renal disease on dialysis  SIGNIFICANT EVENTS:   STUDIES:   Echocardiogram shows a decreased LVEF of 45 to 50% and diffuse hypokinesis.  The aortic valve although calcified is not stenotic  CULTURES:    ANTIBIOTICS:    LINES/TUBES:  Left femoral A-line placed 10/11 Left subclavian triple-lumen placed 10/11 ETT 10/11>>>   CONSULTANTS:  Neurology  Nephrology   SUBJECTIVE:  No events overnight, remains completely unresponsive off sedation  CONSTITUTIONAL: BP (!) 190/75   Pulse 93   Temp 98.6 F (37 C) (Bladder)   Resp 17   Ht 6\' 1"  (1.854 m)   Wt 69.1 kg   SpO2 90%   BMI 20.10 kg/m   I/O last 3 completed shifts: In: 3109.4 [I.V.:2502.8; NG/GT:90; IV Piggyback:516.7] Out: 267 [Urine:95; Emesis/NG output:550]  CVP:  [1 mmHg-5 mmHg] 2 mmHg  Vent Mode: PRVC FiO2 (%):  [30 %] 30 % Set Rate:  [11 bmp] 11 bmp Vt Set:  [630 mL] 630 mL PEEP:  [5 cmH20] 5 cmH20 Plateau Pressure:  [21 cmH20-26 cmH20] 23 cmH20  PHYSICAL EXAM: General: Acute on chronically ill appearing male, unresponsive HEENT: No JVD, pupils are 2 mm and non-reactive, no corneals, no dolls eye, no respiratory drive but gag is  present, MMM Cardiovascular: RRR, Nl S1/S2 and -M/R/G Lungs: Coarse BS diffusely Abdomen: Soft, NT, ND and +BS Musculoskeletal: He is status post left BKA.  There is no edema Skin:  Intact  RESOLVED PROBLEM LIST   ASSESSMENT AND PLAN   This is a 67 year old diabetic with end-stage renal disease on dialysis who has suffered from out of hospital PEA arrest on 10/11 Therapeutic hypothermia protocol has been completed. Neurologic status is extremely poor.  I am limiting all drugs with any potential for sedation to facilitate a neurological examination.  Respiratory failure:  - Maintain on full vent support  - CXR and ABG  - Adjust vent for ABG  - Titrate O2 for sat of 88-92%  Essential HTN:  - Clevaprex and propofol  - Tele monitoring  Anoxic brian injury:  - MRI today  - EEG  - Neuro consult appreciated  ESRD:  - HD per nephrology  GOC: spoke with wife, sister and sister in law, made a full DNR today, after MRI in AM will discuss plan of care.  SUMMARY OF TODAY'S PLAN:    Best Practice / Goals of Care / Disposition.   DVT PROPHYLAXIS: Subcutaneous  heparin SUP: Protonix NUTRITION:  LABS  Glucose Recent Labs  Lab 01/27/18 1924 01/27/18 2020 01/27/18 2327 01/27/18 2344 01/28/18 0416 01/28/18 0734  GLUCAP 73 72 71 83 94 104*    BMET Recent Labs  Lab 01/27/18 2000 01/27/18  2340 01/28/18 0400  NA 140 140 138  K 4.5 4.9 5.0  CL 103 106 104  CO2 21* 22 20*  BUN 49* 53* 55*  CREATININE 12.99* 13.36* 13.45*  GLUCOSE 81 95 101*    Liver Enzymes Recent Labs  Lab 01/16/2018 0711 01/27/18 0103 01/28/18 0400  AST 204* 28 28  ALT 212* 74* 43  ALKPHOS 87 56 60  BILITOT 0.7 0.6 0.7  ALBUMIN 3.3* 2.3* 2.3*    Electrolytes Recent Labs  Lab 01/26/18 0500  01/27/18 2000 01/27/18 2340 01/28/18 0400  CALCIUM 8.0*   < > 7.0* 6.9* 6.8*  MG 2.6*  --   --   --   --   PHOS 5.0*  --   --   --   --    < > = values in this interval not displayed.     CBC Recent Labs  Lab 01/26/18 0500  01/26/18 1624 01/27/18 0103 01/28/18 0400  WBC 5.9  --   --  5.1 4.3  HGB 9.2*   < > 9.2* 9.0* 8.6*  HCT 28.9*   < > 27.0* 28.1* 27.8*  PLT 105*  --   --  91* 77*   < > = values in this interval not displayed.    ABG Recent Labs  Lab 01/26/18 1947 01/27/18 0404 01/28/18 0350  PHART 7.401 7.296* 7.332*  PCO2ART 41.8 54.1* 38.2  PO2ART 102.0 100.0 71.0*    Coag's Recent Labs  Lab 02/11/2018 0817 02/12/2018 1623 02/02/2018 1849  APTT 31 33 36  INR 1.09 1.18 1.25    Sepsis Markers Recent Labs  Lab 01/23/2018 0730 01/27/18 0359  LATICACIDVEN 8.01* 0.9    Cardiac Enzymes Recent Labs  Lab 02/06/2018 1436 02/04/2018 1939 01/26/18 0125  TROPONINI 0.54* 0.67* 0.80*   The patient is critically ill with multiple organ systems failure and requires high complexity decision making for assessment and support, frequent evaluation and titration of therapies, application of advanced monitoring technologies and extensive interpretation of multiple databases.   Critical Care Time devoted to patient care services described in this note is  34  Minutes. This time reflects time of care of this signee Dr Jennet Maduro. This critical care time does not reflect procedure time, or teaching time or supervisory time of PA/NP/Med student/Med Resident etc but could involve care discussion time.  Rush Farmer, M.D. [facility] Pulmonary/Critical Care Medicine. Pager: [phone]. After hours pager: [phone].

## 2018-01-28 NOTE — Plan of Care (Signed)
  Problem: Coping: Goal: Level of anxiety will decrease Outcome: Progressing   Problem: Pain Managment: Goal: General experience of comfort will improve Outcome: Progressing   Problem: Safety: Goal: Ability to remain free from injury will improve Outcome: Progressing   Problem: Skin Integrity: Goal: Risk for impaired skin integrity will decrease Outcome: Progressing   Problem: Skin Integrity: Goal: Risk for impaired skin integrity will be minimized. Outcome: Progressing

## 2018-01-28 NOTE — Progress Notes (Signed)
During mouth care at 0957, patient began jerking and EEG monitor consistent with tonic clonic seizure activity. O2 saturation dropped. Propofol turned back on.

## 2018-01-28 NOTE — Progress Notes (Addendum)
Subjective: Patient rewarmed at 10:45 PM on 01/26/2018 thus not a full 72 hours at this point. Patient is currently on Depakote. Propofol was held for neurological exam.  Patient does not respond to verbal or noxious stimuli and is breathing with the ventilator.  Plan is to go for MRI today  Objective: Current vital signs: BP (!) 116/53   Pulse 81   Temp 98.6 F (37 C) (Bladder)   Resp 11   Ht _0  (1.854 m)   Wt 69.1 kg   SpO2 94%   BMI 20.10 kg/m  Vital signs in last 24 hours: Temp:  [97 F (36.1 C)-98.6 F (37 C)] 98.6 F (37 C) (10/14 0400) Pulse Rate:  [64-88] 81 (10/14 0630) Resp:  [8-15] 11 (10/14 0630) BP: (102-160)/(42-67) 116/53 (10/14 0600) SpO2:  [92 %-100 %] 94 % (10/14 0630) Arterial Line BP: (108-174)/(37-62) 116/41 (10/14 0630) FiO2 (%):  [30 %] 30 % (10/14 0340)  Intake/Output from previous day: 10/13 0701 - 10/14 0700 In: 1659.7 [I.V.:1394.7; IV Piggyback:265.1] Out: 310 [Urine:60; Emesis/NG output:250] Intake/Output this shift: No intake/output data recorded. Nutritional status:  Diet Order            Diet NPO time specified  Diet effective now              Neurologic Exam: Mental Status: Patient does not respond to verbal stimuli.  Does not respond to deep sternal rub.  Does not follow commands.  No verbalizations noted.  Cranial Nerves: II: patient does not respond confrontation bilaterally,  III,IV,VI: doll's response absent bilaterally. pupils right 1 mm, left 1 mm,and nonreactive bilaterally V,VII: corneal reflex weak bilaterally  VIII: patient does not respond to verbal stimuli IX,X: gag reflex absent, XI: trapezius strength unable to test bilaterally XII: tongue strength unable to test Motor: Extremities flaccid throughout.  No spontaneous movement noted.  No purposeful movements noted. Sensory: Does not respond to noxious stimuli in any extremity.   Lab Results: Results for orders placed or performed during the hospital  encounter of 01/26/2018 (from the past 48 hour(s))  Glucose, capillary     Status: None   Collection Time: 01/26/18  8:17 AM  Result Value Ref Range   Glucose-Capillary 92 70 - 99 mg/dL   Comment 1 Notify RN   I-STAT 4, (NA,K, GLUC, HGB,HCT)     Status: Abnormal   Collection Time: 01/26/18  8:20 AM  Result Value Ref Range   Sodium 147 (H) 135 - 145 mmol/L   Potassium 3.5 3.5 - 5.1 mmol/L   Glucose, Bld 88 70 - 99 mg/dL   HCT 22.0 (L) 39.0 - 52.0 %   Hemoglobin 7.5 (L) 13.0 - 17.0 g/dL  Glucose, capillary     Status: None   Collection Time: 01/26/18 12:02 PM  Result Value Ref Range   Glucose-Capillary 91 70 - 99 mg/dL  I-STAT 3, arterial blood gas (G3+)     Status: Abnormal   Collection Time: 01/26/18 12:05 PM  Result Value Ref Range   pH, Arterial 7.510 (H) 7.350 - 7.450   pCO2 arterial 31.3 (L) 32.0 - 48.0 mmHg   pO2, Arterial 104.0 83.0 - 108.0 mmHg   Bicarbonate 25.7 20.0 - 28.0 mmol/L   TCO2 27 22 - 32 mmol/L   O2 Saturation 99.0 %   Acid-Base Excess 2.0 0.0 - 2.0 mmol/L   Patient temperature 33.6 C    Sample type ARTERIAL   I-STAT 3, arterial blood gas (G3+)     Status:  Abnormal   Collection Time: 01/26/18 12:19 PM  Result Value Ref Range   pH, Arterial 7.498 (H) 7.350 - 7.450   pCO2 arterial 30.9 (L) 32.0 - 48.0 mmHg   pO2, Arterial 93.0 83.0 - 108.0 mmHg   Bicarbonate 24.8 20.0 - 28.0 mmol/L   TCO2 26 22 - 32 mmol/L   O2 Saturation 99.0 %   Acid-Base Excess 1.0 0.0 - 2.0 mmol/L   Patient temperature 33.5 C    Sample type ARTERIAL   I-STAT 4, (NA,K, GLUC, HGB,HCT)     Status: Abnormal   Collection Time: 01/26/18 12:24 PM  Result Value Ref Range   Sodium 141 135 - 145 mmol/L   Potassium 3.7 3.5 - 5.1 mmol/L   Glucose, Bld 90 70 - 99 mg/dL   HCT 25.0 (L) 39.0 - 52.0 %   Hemoglobin 8.5 (L) 13.0 - 17.0 g/dL  Glucose, capillary     Status: None   Collection Time: 01/26/18  4:23 PM  Result Value Ref Range   Glucose-Capillary 73 70 - 99 mg/dL   Comment 1 Notify RN    I-STAT 4, (NA,K, GLUC, HGB,HCT)     Status: Abnormal   Collection Time: 01/26/18  4:24 PM  Result Value Ref Range   Sodium 145 135 - 145 mmol/L   Potassium 4.1 3.5 - 5.1 mmol/L   Glucose, Bld 73 70 - 99 mg/dL   HCT 27.0 (L) 39.0 - 52.0 %   Hemoglobin 9.2 (L) 13.0 - 17.0 g/dL  Valproic acid level     Status: None   Collection Time: 01/26/18  4:50 PM  Result Value Ref Range   Valproic Acid Lvl 54 50.0 - 100.0 ug/mL    Comment: Performed at [facility], [place] [address] [place], [place] [address]  Basic metabolic panel     Status: Abnormal   Collection Time: 01/26/18  7:36 PM  Result Value Ref Range   Sodium 141 135 - 145 mmol/L   Potassium 4.2 3.5 - 5.1 mmol/L   Chloride 107 98 - 111 mmol/L   CO2 23 22 - 32 mmol/L   Glucose, Bld 81 70 - 99 mg/dL   BUN 45 (H) 8 - 23 mg/dL   Creatinine, Ser 11.62 (H) 0.61 - 1.24 mg/dL   Calcium 7.5 (L) 8.9 - 10.3 mg/dL   GFR calc non Af Amer 4 (L) >60 mL/min   GFR calc Af Amer 5 (L) >60 mL/min    Comment: (NOTE) The eGFR has been calculated using the CKD EPI equation. This calculation has not been validated in all clinical situations. eGFR's persistently <60 mL/min signify possible Chronic Kidney Disease.    Anion gap 11 5 - 15    Comment: Performed at [facility] [address], [place], [place] [address]  Glucose, capillary     Status: None   Collection Time: 01/26/18  7:45 PM  Result Value Ref Range   Glucose-Capillary 76 70 - 99 mg/dL  I-STAT 3, arterial blood gas (G3+)     Status: None   Collection Time: 01/26/18  7:47 PM  Result Value Ref Range   pH, Arterial 7.401 7.350 - 7.450   pCO2 arterial 41.8 32.0 - 48.0 mmHg   pO2, Arterial 102.0 83.0 - 108.0 mmHg   Bicarbonate 26.2 20.0 - 28.0 mmol/L   TCO2 28 22 - 32 mmol/L   O2 Saturation 98.0 %   Acid-Base Excess 1.0 0.0 - 2.0 mmol/L   Patient temperature 36.0 C  Collection site ARTERIAL LINE    Sample type ARTERIAL   Glucose, capillary     Status: Abnormal    Collection Time: 01/26/18 11:17 PM  Result Value Ref Range   Glucose-Capillary 60 (L) 70 - 99 mg/dL   Comment 1 Arterial Specimen    Comment 2 Notify RN   Basic metabolic panel     Status: Abnormal   Collection Time: 01/26/18 11:20 PM  Result Value Ref Range   Sodium 142 135 - 145 mmol/L   Potassium 4.4 3.5 - 5.1 mmol/L   Chloride 108 98 - 111 mmol/L   CO2 23 22 - 32 mmol/L   Glucose, Bld 67 (L) 70 - 99 mg/dL   BUN 45 (H) 8 - 23 mg/dL   Creatinine, Ser 11.46 (H) 0.61 - 1.24 mg/dL   Calcium 7.3 (L) 8.9 - 10.3 mg/dL   GFR calc non Af Amer 4 (L) >60 mL/min   GFR calc Af Amer 5 (L) >60 mL/min    Comment: (NOTE) The eGFR has been calculated using the CKD EPI equation. This calculation has not been validated in all clinical situations. eGFR's persistently <60 mL/min signify possible Chronic Kidney Disease.    Anion gap 11 5 - 15    Comment: Performed at [facility] [address], [place], [place] [address]  Glucose, capillary     Status: Abnormal   Collection Time: 01/27/18 12:14 AM  Result Value Ref Range   Glucose-Capillary 109 (H) 70 - 99 mg/dL  Comprehensive metabolic panel     Status: Abnormal   Collection Time: 01/27/18  1:03 AM  Result Value Ref Range   Sodium 143 135 - 145 mmol/L   Potassium 4.7 3.5 - 5.1 mmol/L   Chloride 106 98 - 111 mmol/L   CO2 23 22 - 32 mmol/L   Glucose, Bld 91 70 - 99 mg/dL   BUN 46 (H) 8 - 23 mg/dL   Creatinine, Ser 12.01 (H) 0.61 - 1.24 mg/dL   Calcium 7.5 (L) 8.9 - 10.3 mg/dL   Total Protein 5.1 (L) 6.5 - 8.1 g/dL   Albumin 2.3 (L) 3.5 - 5.0 g/dL   AST 28 15 - 41 U/L   ALT 74 (H) 0 - 44 U/L   Alkaline Phosphatase 56 38 - 126 U/L   Total Bilirubin 0.6 0.3 - 1.2 mg/dL   GFR calc non Af Amer 4 (L) >60 mL/min   GFR calc Af Amer 4 (L) >60 mL/min    Comment: (NOTE) The eGFR has been calculated using the CKD EPI equation. This calculation has not been validated in all clinical situations. eGFR's persistently <60 mL/min signify  possible Chronic Kidney Disease.    Anion gap 14 5 - 15    Comment: Performed at [facility] [address] [place], [place] [address]  CBC with Differential/Platelet     Status: Abnormal   Collection Time: 01/27/18  1:03 AM  Result Value Ref Range   WBC 5.1 4.0 - 10.5 K/uL   RBC 2.96 (L) 4.22 - 5.81 MIL/uL   Hemoglobin 9.0 (L) 13.0 - 17.0 g/dL   HCT 28.1 (L) 39.0 - 52.0 %   MCV 94.9 80.0 - 100.0 fL   MCH 30.4 26.0 - 34.0 pg   MCHC 32.0 30.0 - 36.0 g/dL   RDW 13.5 11.5 - 15.5 %   Platelets 91 (L) 150 - 400 K/uL    Comment: Immature Platelet Fraction may be clinically indicated, consider ordering this  additional test [id number] CONSISTENT WITH PREVIOUS RESULT    nRBC 0.0 0.0 - 0.2 %   Neutrophils Relative % 77 %   Neutro Abs 3.9 1.7 - 7.7 K/uL   Lymphocytes Relative 7 %   Lymphs Abs 0.3 (L) 0.7 - 4.0 K/uL   Monocytes Relative 8 %   Monocytes Absolute 0.4 0.1 - 1.0 K/uL   Eosinophils Relative 8 %   Eosinophils Absolute 0.4 0.0 - 0.5 K/uL   Basophils Relative 0 %   Basophils Absolute 0.0 0.0 - 0.1 K/uL   Immature Granulocytes 0 %   Abs Immature Granulocytes 0.01 0.00 - 0.07 K/uL    Comment: Performed at [facility] [address], [place], [place] [address]  Valproic acid level     Status: None   Collection Time: 01/27/18  1:03 AM  Result Value Ref Range   Valproic Acid Lvl 64 50.0 - 100.0 ug/mL    Comment: Performed at [facility] [address], [place], [place] [address]  Lactic acid, plasma     Status: None   Collection Time: 01/27/18  3:59 AM  Result Value Ref Range   Lactic Acid, Venous 0.9 0.5 - 1.9 mmol/L    Comment: Performed at [facility] [address], [place], [place] [address]  Glucose, capillary     Status: Abnormal   Collection Time: 01/27/18  4:01 AM  Result Value Ref Range   Glucose-Capillary 69 (L) 70 - 99 mg/dL  I-STAT 3, arterial blood gas (G3+)     Status: Abnormal   Collection Time: 01/27/18  4:04 AM  Result  Value Ref Range   pH, Arterial 7.296 (L) 7.350 - 7.450   pCO2 arterial 54.1 (H) 32.0 - 48.0 mmHg   pO2, Arterial 100.0 83.0 - 108.0 mmHg   Bicarbonate 26.3 20.0 - 28.0 mmol/L   TCO2 28 22 - 32 mmol/L   O2 Saturation 97.0 %   Patient temperature 37.1 C    Collection site ARTERIAL LINE    Sample type ARTERIAL   Glucose, capillary     Status: None   Collection Time: 01/27/18  4:46 AM  Result Value Ref Range   Glucose-Capillary 70 70 - 99 mg/dL  Glucose, capillary     Status: None   Collection Time: 01/27/18  5:14 AM  Result Value Ref Range   Glucose-Capillary 81 70 - 99 mg/dL  Basic metabolic panel     Status: Abnormal   Collection Time: 01/27/18  8:11 AM  Result Value Ref Range   Sodium 141 135 - 145 mmol/L   Potassium 4.6 3.5 - 5.1 mmol/L   Chloride 104 98 - 111 mmol/L   CO2 23 22 - 32 mmol/L   Glucose, Bld 86 70 - 99 mg/dL   BUN 45 (H) 8 - 23 mg/dL   Creatinine, Ser 12.28 (H) 0.61 - 1.24 mg/dL   Calcium 7.2 (L) 8.9 - 10.3 mg/dL   GFR calc non Af Amer 4 (L) >60 mL/min   GFR calc Af Amer 4 (L) >60 mL/min    Comment: (NOTE) The eGFR has been calculated using the CKD EPI equation. This calculation has not been validated in all clinical situations. eGFR's persistently <60 mL/min signify possible Chronic Kidney Disease.    Anion gap 14 5 - 15    Comment: Performed at [facility] [address], [place], [place] [address]  Glucose, capillary     Status: None   Collection Time:  01/27/18  8:14 AM  Result Value Ref Range   Glucose-Capillary 78 70 - 99 mg/dL   Comment 1 Arterial Specimen   Basic metabolic panel     Status: Abnormal   Collection Time: 01/27/18 10:18 AM  Result Value Ref Range   Sodium 142 135 - 145 mmol/L   Potassium 4.6 3.5 - 5.1 mmol/L   Chloride 107 98 - 111 mmol/L   CO2 24 22 - 32 mmol/L   Glucose, Bld 52 (L) 70 - 99 mg/dL   BUN 46 (H) 8 - 23 mg/dL   Creatinine, Ser 12.41 (H) 0.61 - 1.24 mg/dL   Calcium 7.2 (L) 8.9 - 10.3 mg/dL   GFR calc non Af  Amer 4 (L) >60 mL/min   GFR calc Af Amer 4 (L) >60 mL/min    Comment: (NOTE) The eGFR has been calculated using the CKD EPI equation. This calculation has not been validated in all clinical situations. eGFR's persistently <60 mL/min signify possible Chronic Kidney Disease.    Anion gap 11 5 - 15    Comment: Performed at [facility] [address] [place], [place] [address]  Glucose, capillary     Status: Abnormal   Collection Time: 01/27/18 11:15 AM  Result Value Ref Range   Glucose-Capillary 39 (LL) 70 - 99 mg/dL   Comment 1 Arterial Specimen   Glucose, capillary     Status: None   Collection Time: 01/27/18 12:01 PM  Result Value Ref Range   Glucose-Capillary 96 70 - 99 mg/dL   Comment 1 Arterial Specimen   Cortisol     Status: None   Collection Time: 01/27/18 12:30 PM  Result Value Ref Range   Cortisol, Plasma 3.5 ug/dL    Comment: (NOTE) AM    6.7 - 22.6 ug/dL PM   <10.0       ug/dL Performed at [facility] [address], [place], [place] [address]   Glucose, capillary     Status: Abnormal   Collection Time: 01/27/18  2:17 PM  Result Value Ref Range   Glucose-Capillary 56 (L) 70 - 99 mg/dL   Comment 1 Arterial Specimen   Glucose, capillary     Status: Abnormal   Collection Time: 01/27/18  3:53 PM  Result Value Ref Range   Glucose-Capillary 100 (H) 70 - 99 mg/dL   Comment 1 Notify RN   Basic metabolic panel     Status: Abnormal   Collection Time: 01/27/18  4:08 PM  Result Value Ref Range   Sodium 141 135 - 145 mmol/L   Potassium 4.1 3.5 - 5.1 mmol/L   Chloride 107 98 - 111 mmol/L   CO2 22 22 - 32 mmol/L   Glucose, Bld 92 70 - 99 mg/dL   BUN 48 (H) 8 - 23 mg/dL   Creatinine, Ser 12.61 (H) 0.61 - 1.24 mg/dL   Calcium 7.0 (L) 8.9 - 10.3 mg/dL   GFR calc non Af Amer 4 (L) >60 mL/min   GFR calc Af Amer 4 (L) >60 mL/min    Comment: (NOTE) The eGFR has been calculated using the CKD EPI equation. This calculation has not been validated in all  clinical situations. eGFR's persistently <60 mL/min signify possible Chronic Kidney Disease.    Anion gap 12 5 - 15    Comment: Performed at [facility] [address], [place], [place] [address]  Glucose, capillary     Status: None   Collection Time: 01/27/18  5:54 PM  Result Value Ref Range   Glucose-Capillary 72 70 - 99 mg/dL   Comment 1 Arterial Specimen   Glucose, capillary     Status: None   Collection Time: 01/27/18  7:24 PM  Result Value Ref Range   Glucose-Capillary 73 70 - 99 mg/dL   Comment 1 Arterial Specimen   Basic metabolic panel     Status: Abnormal   Collection Time: 01/27/18  8:00 PM  Result Value Ref Range   Sodium 140 135 - 145 mmol/L   Potassium 4.5 3.5 - 5.1 mmol/L   Chloride 103 98 - 111 mmol/L   CO2 21 (L) 22 - 32 mmol/L   Glucose, Bld 81 70 - 99 mg/dL   BUN 49 (H) 8 - 23 mg/dL   Creatinine, Ser 12.99 (H) 0.61 - 1.24 mg/dL   Calcium 7.0 (L) 8.9 - 10.3 mg/dL   GFR calc non Af Amer 3 (L) >60 mL/min   GFR calc Af Amer 4 (L) >60 mL/min    Comment: (NOTE) The eGFR has been calculated using the CKD EPI equation. This calculation has not been validated in all clinical situations. eGFR's persistently <60 mL/min signify possible Chronic Kidney Disease.    Anion gap 16 (H) 5 - 15    Comment: Performed at [facility], [place] [address], [place], [place] [address]  Glucose, capillary     Status: None   Collection Time: 01/27/18  8:20 PM  Result Value Ref Range   Glucose-Capillary 72 70 - 99 mg/dL   Comment 1 Capillary Specimen    Comment 2 Notify RN   Glucose, capillary     Status: None   Collection Time: 01/27/18 11:27 PM  Result Value Ref Range   Glucose-Capillary 71 70 - 99 mg/dL  Basic metabolic panel     Status: Abnormal   Collection Time: 01/27/18 11:40 PM  Result Value Ref Range   Sodium 140 135 - 145 mmol/L   Potassium 4.9 3.5 - 5.1 mmol/L   Chloride 106 98 - 111 mmol/L   CO2 22 22 - 32 mmol/L   Glucose, Bld 95 70 - 99 mg/dL   BUN  53 (H) 8 - 23 mg/dL   Creatinine, Ser 13.36 (H) 0.61 - 1.24 mg/dL   Calcium 6.9 (L) 8.9 - 10.3 mg/dL   GFR calc non Af Amer 3 (L) >60 mL/min   GFR calc Af Amer 4 (L) >60 mL/min    Comment: (NOTE) The eGFR has been calculated using the CKD EPI equation. This calculation has not been validated in all clinical situations. eGFR's persistently <60 mL/min signify possible Chronic Kidney Disease.    Anion gap 12 5 - 15    Comment: Performed at [facility] [address], [place], [place] [address]  Glucose, capillary     Status: None   Collection Time: 01/27/18 11:44 PM  Result Value Ref Range   Glucose-Capillary 83 70 - 99 mg/dL   Comment 1 Capillary Specimen    Comment 2 Notify RN   I-STAT 3, arterial blood gas (G3+)     Status: Abnormal   Collection Time: 01/28/18  3:50 AM  Result Value Ref Range   pH, Arterial 7.332 (L) 7.350 - 7.450   pCO2 arterial 38.2 32.0 - 48.0 mmHg   pO2, Arterial 71.0 (L) 83.0 - 108.0 mmHg   Bicarbonate 20.2 20.0 - 28.0 mmol/L   TCO2 21 (L) 22 - 32 mmol/L   O2 Saturation 93.0 %  Acid-base deficit 5.0 (H) 0.0 - 2.0 mmol/L   Patient temperature 98.6 F    Collection site ARTERIAL LINE    Drawn by RT    Sample type ARTERIAL   Comprehensive metabolic panel     Status: Abnormal   Collection Time: 01/28/18  4:00 AM  Result Value Ref Range   Sodium 138 135 - 145 mmol/L   Potassium 5.0 3.5 - 5.1 mmol/L   Chloride 104 98 - 111 mmol/L   CO2 20 (L) 22 - 32 mmol/L   Glucose, Bld 101 (H) 70 - 99 mg/dL   BUN 55 (H) 8 - 23 mg/dL   Creatinine, Ser 13.45 (H) 0.61 - 1.24 mg/dL   Calcium 6.8 (L) 8.9 - 10.3 mg/dL   Total Protein 4.9 (L) 6.5 - 8.1 g/dL   Albumin 2.3 (L) 3.5 - 5.0 g/dL   AST 28 15 - 41 U/L   ALT 43 0 - 44 U/L   Alkaline Phosphatase 60 38 - 126 U/L   Total Bilirubin 0.7 0.3 - 1.2 mg/dL   GFR calc non Af Amer 3 (L) >60 mL/min   GFR calc Af Amer 4 (L) >60 mL/min    Comment: (NOTE) The eGFR has been calculated using the CKD EPI equation. This  calculation has not been validated in all clinical situations. eGFR's persistently <60 mL/min signify possible Chronic Kidney Disease.    Anion gap 14 5 - 15    Comment: Performed at [facility] [address] [place], [place] [address]  CBC with Differential/Platelet     Status: Abnormal   Collection Time: 01/28/18  4:00 AM  Result Value Ref Range   WBC 4.3 4.0 - 10.5 K/uL   RBC 2.91 (L) 4.22 - 5.81 MIL/uL   Hemoglobin 8.6 (L) 13.0 - 17.0 g/dL   HCT 27.8 (L) 39.0 - 52.0 %   MCV 95.5 80.0 - 100.0 fL   MCH 29.6 26.0 - 34.0 pg   MCHC 30.9 30.0 - 36.0 g/dL   RDW 13.2 11.5 - 15.5 %   Platelets 77 (L) 150 - 400 K/uL    Comment: Immature Platelet Fraction may be clinically indicated, consider ordering this additional test [id number] CONSISTENT WITH PREVIOUS RESULT    nRBC 0.0 0.0 - 0.2 %   Neutrophils Relative % 85 %   Neutro Abs 3.7 1.7 - 7.7 K/uL   Lymphocytes Relative 4 %   Lymphs Abs 0.2 (L) 0.7 - 4.0 K/uL   Monocytes Relative 10 %   Monocytes Absolute 0.4 0.1 - 1.0 K/uL   Eosinophils Relative 0 %   Eosinophils Absolute 0.0 0.0 - 0.5 K/uL   Basophils Relative 0 %   Basophils Absolute 0.0 0.0 - 0.1 K/uL   Immature Granulocytes 1 %   Abs Immature Granulocytes 0.02 0.00 - 0.07 K/uL    Comment: Performed at [facility], [place] [address], [place], [place] [address]  Triglycerides     Status: None   Collection Time: 01/28/18  4:08 AM  Result Value Ref Range   Triglycerides 108 <150 mg/dL    Comment: Performed at [facility] [address], [place], [place] [address]  Glucose, capillary     Status: None   Collection Time: 01/28/18  4:16 AM  Result Value Ref Range   Glucose-Capillary 94 70 - 99 mg/dL   Comment 1 Arterial Specimen     Recent Results (from the past 240 hour(s))  MRSA PCR Screening  Status: None   Collection Time: 02/01/2018  9:36 AM  Result Value Ref Range Status   MRSA by PCR NEGATIVE NEGATIVE Final    Comment:        The GeneXpert  MRSA Assay (FDA approved for NASAL specimens only), is one component of a comprehensive MRSA colonization surveillance program. It is not intended to diagnose MRSA infection nor to guide or monitor treatment for MRSA infections. Performed at [facility], [place] [address], [place], [place] [address]     Lipid Panel Recent Labs    01/28/18 0408  TRIG 108    Studies/Results: Dg Chest Port 1 View  Result Date: 01/27/2018 CLINICAL DATA:  Cardiac arrest EXAM: PORTABLE CHEST 1 VIEW COMPARISON:  01/27/2018 FINDINGS: Left subclavian central line, endotracheal tube and nasogastric catheter are noted and stable. Cardiac shadow is stable. Multiple calcified lymph nodes are identified. The lungs show improved aeration bilaterally although some persistent density remains in the right base. No pneumothorax is noted. IMPRESSION: Improved aeration. Tubes and lines as described. Electronically Signed   By: Inez Catalina M.D.   On: 01/27/2018 08:22    Medications:  Scheduled: . artificial tears  1 application Both Eyes S8P  . chlorhexidine gluconate (MEDLINE KIT)  15 mL Mouth Rinse BID  . Chlorhexidine Gluconate Cloth  6 each Topical Q0600  . fentaNYL (SUBLIMAZE) injection  50 mcg Intravenous Once  . heparin injection (subcutaneous)  5,000 Units Subcutaneous Q12H  . hydrocortisone sod succinate (SOLU-CORTEF) inj  50 mg Intravenous Q6H  . insulin aspart  0-9 Units Subcutaneous Q4H  . mouth rinse  15 mL Mouth Rinse 10 times per day  . multivitamin  1 tablet Oral QHS   Continuous: . sodium chloride Stopped (01/27/18 1017)  . clevidipine Stopped (01/23/2018 2008)  . dextrose 30 mL/hr at 01/28/18 0600  . famotidine (PEPCID) IV Stopped (01/27/18 2158)  . norepinephrine (LEVOPHED) Adult infusion Stopped (01/27/18 2057)  . propofol (DIPRIVAN) infusion 60 mcg/kg/min (01/28/18 0600)  . valproate sodium 500 mg (01/28/18 1031)    Assessment:  67 year old male status post cardiac arrest who was on  hypothermia protocol and completed rewarming at 10:45 PM on 01/26/2018. Exhibited myoclonus on presentation requiring sedatives and paralytics. Initial EEG showed burst suppression pattern. After rewarming he continued to have some myoclonic activity for which he was given Depakote. 1. Myoclonus and burst suppression on EEG are suggestive of severe anoxic brain injury. These findings early in the course following cardiac arrest portends poor prognosis. 2. Formal prognostication not possible until Tuesday, which will be 72 hours after rewarming. Should be kept off all sedation until then.  3. Overall impression: Post cardiac-arrest severe anoxic brain injury  Recommendations: -Supportive management per primary team as you are -Continue with video EEG. If no seizures after discontinuation of propofol, most likely will be able to discontinue LTM on Tuesday.  -If myoclonic movements recur, consider adding Keppra to Depakote.   LOS: 3 days   _0  signed: Dr. Kerney Elbe 01/28/2018  7:26 AM

## 2018-01-28 NOTE — Progress Notes (Signed)
Subjective: Interval History: nonresponsive, on vent,on EEG  Objective: Vital signs in last 24 hours: Temp:  [97 F (36.1 C)-98.6 F (37 C)] 98.6 F (37 C) (10/14 0400) Pulse Rate:  [64-88] 81 (10/14 0630) Resp:  [8-15] 11 (10/14 0735) BP: (107-160)/(42-67) 127/44 (10/14 0735) SpO2:  [92 %-100 %] 99 % (10/14 0735) Arterial Line BP: (108-174)/(37-62) 116/41 (10/14 0630) FiO2 (%):  [30 %] 30 % (10/14 0735) Weight change:   Intake/Output from previous day: 10/13 0701 - 10/14 0700 In: 1659.7 [I.V.:1394.7; IV Piggyback:265.1] Out: 310 [Urine:60; Emesis/NG output:250] Intake/Output this shift: No intake/output data recorded.  General appearance: nonresponsive, EEG in place, on vent, still wrapped with cooling pads Resp: clear to auscultation bilaterally Cardio: S1, S2 normal and systolic murmur: systolic ejection 2/6, decrescendo at 2nd left intercostal space GI: liver down 3 cm,pos bs Extremities: AVG LUA , 2+ edema.  Lab Results: Recent Labs    01/27/18 0103 01/28/18 0400  WBC 5.1 4.3  HGB 9.0* 8.6*  HCT 28.1* 27.8*  PLT 91* 77*   BMET:  Recent Labs    01/27/18 2340 01/28/18 0400  NA 140 138  K 4.9 5.0  CL 106 104  CO2 22 20*  GLUCOSE 95 101*  BUN 53* 55*  CREATININE 13.36* 13.45*  CALCIUM 6.9* 6.8*   No results for input(s): PTH in the last 72 hours. Iron Studies: No results for input(s): IRON, TIBC, TRANSFERRIN, FERRITIN in the last 72 hours.  Studies/Results: Dg Chest Port 1 View  Result Date: 01/27/2018 CLINICAL DATA:  Cardiac arrest EXAM: PORTABLE CHEST 1 VIEW COMPARISON:  01/18/2018 FINDINGS: Left subclavian central line, endotracheal tube and nasogastric catheter are noted and stable. Cardiac shadow is stable. Multiple calcified lymph nodes are identified. The lungs show improved aeration bilaterally although some persistent density remains in the right base. No pneumothorax is noted. IMPRESSION: Improved aeration. Tubes and lines as described.  Electronically Signed   By: Inez Catalina M.D.   On: 01/27/2018 08:22    I have reviewed the patient's current medications.  Assessment/Plan: 1 ESRD will do Hd 2 Anemia follow for now 3 PEA arrest 4 Enceph of sedation no activitiy  5 HPTH will not address at this time 6 VDRF per CCM P HD, EEG, goals of care    LOS: 3 days   Jeneen Rinks Tarek Cravens 01/28/2018,8:36 AM

## 2018-01-28 NOTE — Procedures (Signed)
LTM-EEG Report  HISTORY: Continuous video-EEG monitoring performed for83year old with anoxia, myoclonus. ACQUISITION: International 10-20 system for electrode placement; 18 channels with additional eyes linked to ipsilateral ears and EKG. Additional T1-T2 electrodes were used. Continuous video recording obtained.   EEG NUMBER:  MEDICATIONS:  Day 3:See EMR  DAY #3: [id number] 10/13/19to 0730 01/28/18  BACKGROUND: An overalllowvoltage, continuous record withpoorspontaneous variability andno definitereactivity. The background consisted of attenuated, low voltage 1-5Hz  activity bilaterally with sparse superimposed faster frequencies. Attenuation of background increased in the afternoon. Reactivity was not definitely observed. EPILEPTIFORM/PERIODIC ACTIVITY:There were initially medium voltage right maximal GPDs with 0.5-1Hz  frequency, spiky morphology, no clear ictal evolution. These became lower voltage and more blunted with increased sedation through the afternoon.  SEIZURES: none EVENTS:One event of coughing at 1700 without EEG correlate  EKG: no significant arrhythmia  SUMMARY: This wasan abnormal continuous video EEG due to low voltage slowing and right maximal periodic activity, indicative of a diffuse cerebral disturbance with underlying epileptogenic potential. No seizures were seen. Periodic activity has improved in morphology with increased sedation. There was no definite reactivity, however more clear background features were observed compared to the prior 24 hours of recording.

## 2018-01-28 NOTE — Procedures (Signed)
I was present at this session.  I have reviewed the session itself and made appropriate changes.  HD via LUA AVG . bp 120s, tol well.    Jeneen Rinks Kimyatta Lecy 10/14/20192:15 PM

## 2018-01-28 NOTE — Progress Notes (Signed)
Nutrition Follow-up  DOCUMENTATION CODES:   Non-severe (moderate) malnutrition in context of chronic illness  INTERVENTION:   -Vital High Protein @ 30 ml/hr (741m) via OGT -60 ml Prostat BID  Provides: 1120 kcals (1809 kcal with current propofol), 123 grams protein, 602 ml free water. Meets 107% calorie needs and 100% of protein needs.   Continue Rena-Vit  NUTRITION DIAGNOSIS:   Moderate Malnutrition related to chronic illness(ESRD on HD) as evidenced by mild fat depletion, moderate fat depletion, mild muscle depletion, moderate muscle depletion.  Ongoing  GOAL:   Patient will meet greater than or equal to 90% of their needs  Not met- will address with TF  MONITOR:   Vent status, Labs, Weight trends, Skin, I & O's  REASON FOR ASSESSMENT:   Ventilator    ASSESSMENT:   67year old male who presented to the ED on 10/11 s/p PEA arrest. Pt had immediate CPR for 18 minutes and required intubation. PMH significant for ESRD on HD, anemia, hypertension, GERD, PVD s/p left BKA, and type 2 diabetes mellitus.   10/12- hypothermia protocol complete   Pt undergoing HD this afternoon. Unresponsive off sedation. Prognosis noted to be dismal. Family made pt DNR today. Awaiting MRI results for further determination of goals of care. Spoke with CCM, okay to start TF via OGT.    Patient is currently intubated on ventilator support MV: 9.4 L/min Temp (24hrs), Avg:98.3 F (36.8 C), Min:97.1 F (36.2 C), Max:98.6 F (37 C) BP: 135/56 MAP: 78 Propofol: 26.1 ml/hr- provides 689 kcal Cleviprex discontinued  I/O: + 5.5 L since admit UOP: 60 ml x 24 hrs  Medications reviewed and include: solucortef, rena-vit, D10 @ 30 ml/hr, propofol Labs reviewed: Creatinine 13.45 (H) corrected calcium 8.2 (L)  Diet Order:   Diet Order            Diet NPO time specified  Diet effective now              EDUCATION NEEDS:   Not appropriate for education at this time  Skin:  Skin  Assessment: Reviewed RN Assessment  Last BM:  02/13/2018  Height:   Ht Readings from Last 1 Encounters:  01/31/2018 6' 1"  (1.854 m)    Weight:   Wt Readings from Last 1 Encounters:  01/27/18 69.1 kg    Ideal Body Weight:  78.2 kg  BMI:  Body mass index is 20.1 kg/m.  Estimated Nutritional Needs:   Kcal:  1689 kcal  Protein:  115-130 grams  Fluid:  1000 ml + UOP   CMariana SingleRD, LDN Clinical Nutrition Pager # [phone]

## 2018-01-29 ENCOUNTER — Inpatient Hospital Stay (HOSPITAL_COMMUNITY): Payer: Medicare Other

## 2018-01-29 DIAGNOSIS — I10 Essential (primary) hypertension: Secondary | ICD-10-CM

## 2018-01-29 DIAGNOSIS — G931 Anoxic brain damage, not elsewhere classified: Secondary | ICD-10-CM

## 2018-01-29 DIAGNOSIS — Z515 Encounter for palliative care: Secondary | ICD-10-CM

## 2018-01-29 LAB — RENAL FUNCTION PANEL
ALBUMIN: 2 g/dL — AB (ref 3.5–5.0)
ANION GAP: 17 — AB (ref 5–15)
BUN: 48 mg/dL — ABNORMAL HIGH (ref 8–23)
CO2: 23 mmol/L (ref 22–32)
Calcium: 6.9 mg/dL — ABNORMAL LOW (ref 8.9–10.3)
Chloride: 94 mmol/L — ABNORMAL LOW (ref 98–111)
Creatinine, Ser: 9.3 mg/dL — ABNORMAL HIGH (ref 0.61–1.24)
GFR, EST AFRICAN AMERICAN: 6 mL/min — AB (ref >60)
GFR, EST NON AFRICAN AMERICAN: 5 mL/min — AB (ref >60)
Glucose, Bld: 152 mg/dL — ABNORMAL HIGH (ref 70–99)
PHOSPHORUS: 7.5 mg/dL — AB (ref 2.5–4.6)
Potassium: 5.1 mmol/L (ref 3.5–5.1)
SODIUM: 134 mmol/L — AB (ref 135–145)

## 2018-01-29 LAB — POCT I-STAT 3, ART BLOOD GAS (G3+)
Acid-base deficit: 2 mmol/L (ref 0.0–2.0)
Bicarbonate: 23.5 mmol/L (ref 20.0–28.0)
O2 SAT: 95 %
PCO2 ART: 39 mmHg (ref 32.0–48.0)
PH ART: 7.382 (ref 7.350–7.450)
TCO2: 25 mmol/L (ref 22–32)
pO2, Arterial: 71 mmHg — ABNORMAL LOW (ref 83.0–108.0)

## 2018-01-29 LAB — GLUCOSE, CAPILLARY
GLUCOSE-CAPILLARY: 126 mg/dL — AB (ref 70–99)
Glucose-Capillary: 151 mg/dL — ABNORMAL HIGH (ref 70–99)
Glucose-Capillary: 125 mg/dL — ABNORMAL HIGH (ref 70–99)

## 2018-01-29 LAB — CBC
HCT: 24.9 % — ABNORMAL LOW (ref 39.0–52.0)
Hemoglobin: 8.1 g/dL — ABNORMAL LOW (ref 13.0–17.0)
MCH: 30.5 pg (ref 26.0–34.0)
MCHC: 32.5 g/dL (ref 30.0–36.0)
MCV: 93.6 fL (ref 80.0–100.0)
NRBC: 0 % (ref 0.0–0.2)
PLATELETS: 68 10*3/uL — AB (ref 150–400)
RBC: 2.66 MIL/uL — AB (ref 4.22–5.81)
RDW: 13.2 % (ref 11.5–15.5)
WBC: 3.6 10*3/uL — ABNORMAL LOW (ref 4.0–10.5)

## 2018-01-29 LAB — HEPATITIS B CORE ANTIBODY, TOTAL: Hep B Core Total Ab: NEGATIVE

## 2018-01-29 LAB — HEPATITIS B SURFACE ANTIGEN: Hepatitis B Surface Ag: NEGATIVE

## 2018-01-29 LAB — HEPARIN INDUCED PLATELET AB (HIT ANTIBODY): Heparin Induced Plt Ab: 0.462 OD — ABNORMAL HIGH (ref 0.000–0.400)

## 2018-01-29 LAB — MAGNESIUM: MAGNESIUM: 1.8 mg/dL (ref 1.7–2.4)

## 2018-01-29 MED ORDER — MORPHINE 100MG IN NS 100ML (1MG/ML) PREMIX INFUSION
10.0000 mg/h | INTRAVENOUS | Status: DC
  Administered 2018-01-29: 10 mg/h via INTRAVENOUS
  Filled 2018-01-29: qty 100

## 2018-01-29 MED ORDER — CHLORHEXIDINE GLUCONATE 0.12 % MT SOLN
OROMUCOSAL | Status: AC
  Administered 2018-01-29: 09:00:00
  Filled 2018-01-29: qty 15

## 2018-01-29 MED ORDER — MORPHINE BOLUS VIA INFUSION
5.0000 mg | INTRAVENOUS | Status: DC | PRN
  Filled 2018-01-29: qty 20

## 2018-01-31 ENCOUNTER — Telehealth: Payer: Self-pay

## 2018-01-31 NOTE — Telephone Encounter (Signed)
On 01/31/18 I received a d/c from [facility].  The d/c is for burial. The patient is a patient of Doctor Nelda Marseille.  D/C will be taken to [place] for signature on Monday ([id number]) when Doctor Nelda Marseille comes back from vacation.  On 02/05/18 I received the d/c back from Doctor Nelda Marseille.  I got the d/c ready and called the funeral home to let them know the d/c is ready for pickup.

## 2018-02-14 ENCOUNTER — Encounter: Payer: Self-pay | Admitting: Neurology

## 2018-02-15 NOTE — Progress Notes (Signed)
MRI reveals widespread restricted diffusion in the cerebral cortices and basal ganglia bilaterally, consistent with cytotoxic edema from diffuse anoxic brain injury. This finding significantly increases the likelihood of a poor long-term neurological outcome.   Electronically signed: Dr. Kerney Elbe

## 2018-02-15 NOTE — Progress Notes (Signed)
Expiration Note:  Patient pronounced at 1635 by Winfred Burn and Dia Sitter) [place].  No Heart rhythm/tones or breathsounds ascultated x1 full minute.  Dr. Nelda Marseille notified of expiration.

## 2018-02-15 NOTE — Progress Notes (Signed)
Extubated by RRT per MD orders.  Family at bedside.

## 2018-02-15 NOTE — Progress Notes (Signed)
Patient just got back from MRI. MD was called about placing the patient back on EEG monitoring. MD states that patient can be placed back on EEG in the morning.

## 2018-02-15 NOTE — Progress Notes (Signed)
[facility] Progress Note Patient Name: Wayne [name] DOB: 03/09/1951 MRN: [id number]   Date of Service  2018-02-05  HPI/Events of Note  Several loose stools - Request for Flexiseal.   eICU Interventions  Will order Flexiseal.      Intervention Category Major Interventions: Other:  Koltyn Kelsay Cornelia Copa 02/05/2018, 2:57 AM

## 2018-02-15 NOTE — Progress Notes (Signed)
I completed a follow-up visit to provide spiritual support for the patient and his family. I visited the patient's room with the patient's sisters, Butch Penny and Sunday Spillers, present. I provided spiritual support by sharing words of encouragement and led in prayer. I shared that the Chaplain is available for additional support as needed or requested.    February 08, 2018 1000  Clinical Encounter Type  Visited With Patient and family together  Visit Type Follow-up;Spiritual support  Referral From Nurse  Consult/Referral To Chaplain  Spiritual Encounters  Spiritual Needs Prayer;Emotional  Stress Factors  Patient Stress Factors None identified  Family Stress Factors None identified    Chaplain Dr Redgie Grayer

## 2018-02-15 NOTE — Progress Notes (Signed)
Pt transported to and from MRI after suctioning without event.  RT will monitor.

## 2018-02-15 NOTE — Progress Notes (Signed)
Subjective: Interval History: Clotted AVG over night.  Apparently family has made decision to withdraw care.   Objective: Vital signs in last 24 hours: Temp:  [96.5 F (35.8 C)-98.6 F (37 C)] 96.5 F (35.8 C) (10/15 0400) Pulse Rate:  [57-93] 59 (10/15 0745) Resp:  [11-24] 11 (10/15 0745) BP: (109-190)/(40-75) 129/57 (10/15 0730) SpO2:  [90 %-100 %] 99 % (10/15 0745) Arterial Line BP: (100-209)/(32-63) 138/42 (10/15 0745) FiO2 (%):  [30 %-40 %] 30 % (10/15 0400) Weight change:   Intake/Output from previous day: 10/14 0701 - 10/15 0700 In: 2293.3 [I.V.:1293; NG/GT:754; IV Piggyback:246.4] Out: 2000  Intake/Output this shift: No intake/output data recorded.  General appearance: on vent, no response.  Resp: rales bibasilar and rhonchi bibasilar Cardio: S1, S2 normal and systolic murmur: systolic ejection 2/6, crescendo and decrescendo at 2nd left intercostal space GI: pos bs, soft Extremities: L BKA, LUA avg no B or T  Lab Results: Recent Labs    01/28/18 0400 Feb 05, 2018 0543  WBC 4.3 3.6*  HGB 8.6* 8.1*  HCT 27.8* 24.9*  PLT 77* 68*   BMET:  Recent Labs    01/28/18 0400 February 05, 2018 0543  NA 138 134*  K 5.0 5.1  CL 104 94*  CO2 20* 23  GLUCOSE 101* 152*  BUN 55* 48*  CREATININE 13.45* 9.30*  CALCIUM 6.8* 6.9*   No results for input(s): PTH in the last 72 hours. Iron Studies: No results for input(s): IRON, TIBC, TRANSFERRIN, FERRITIN in the last 72 hours.  Studies/Results: Mr Brain Wo Contrast  Result Date: 2018-02-05 CLINICAL DATA:  67 y/o M; PA arrest, anoxic brain injury post cardiac arrest. EXAM: MRI HEAD WITHOUT CONTRAST TECHNIQUE: Multiplanar, multiecho pulse sequences of the brain and surrounding structures were obtained without intravenous contrast. COMPARISON:  02/06/2018 CT head.  03/14/2017 MRI head. FINDINGS: Brain: Diffuse cortical and basal ganglia reduced diffusion. Increased T2 and FLAIR signal of the supratentorial cortex, basal ganglia, and the  cerebellar folia. Findings are compatible with diffuse hypoxic ischemic injury of the brain. No associated mass effect or acute hemorrhage at this time. No extra-axial collection, hydrocephalus, or herniation. There a few small periventricular white matter infarctions, chronic lacunar infarctions within the thalami and pons, moderate microvascular ischemic changes of white matter, and mild volume loss of the brain. There are a few scattered foci of susceptibility hypointensity compatible with hemosiderin deposition of chronic microhemorrhage in a nonspecific distribution. Vascular: Normal flow voids. Skull and upper cervical spine: Normal marrow signal. Sinuses/Orbits: Paranasal sinus mucosal thickening partial opacification of mastoid air cells likely related to intubation. No abnormal signal of the orbits. Other: None. IMPRESSION: 1. 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 No associated acute hemorrhage or mass effect. 2. 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 Moderate chronic microvascular ischemic changes and mild volume loss of the brain. These results will be called to the ordering clinician or representative by the Radiologist Assistant, and communication documented in the PACS or zVision Dashboard. Electronically Signed   By: Kristine Garbe M.D.   On: 02-05-18 02:11    I have reviewed the patient's current medications.  Assessment/Plan: 1 ESRD HD ok yest.  Will not write orders at this time pending decision on withdrawal 2 Severe anoxic brain damage 3 DM 4 VDRF 5 PEA arrest 6 Anemia will not address P ? Withdrawal per family, support until that time    LOS: 4 days   Jeneen Rinks Rakin Lemelle 02/05/18,8:06 AM

## 2018-02-15 NOTE — Progress Notes (Signed)
PULMONARY / CRITICAL CARE MEDICINE   NAME:  Wayne [name], MRN:  [id number], DOB:  1950/11/26, LOS: 4 ADMISSION DATE:  02/08/2018,  REFERRING MD:  , CHIEF COMPLAINT: PEA out of hospital arrest  BRIEF HISTORY:    67 year old diabetic with end-stage renal disease status post out of hospital PEA arrest on 10/11  HISTORY OF PRESENT ILLNESS      67 year old male with known end-stage renal disease who has a past medical history of end-stage renal disease, diabetes mellitus, poor compliance who was on his way to dialysis when he felt funny and vomited and had a PEA arrest.  CPR started almost immediately he was intubated in the field transported to [facility] and noted to have myoclonic jerks once he was stabilized  SIGNIFICANT PAST MEDICAL HISTORY   Diabetes, end-stage renal disease on dialysis  SIGNIFICANT EVENTS:    STUDIES:   Echocardiogram shows a decreased LVEF of 45 to 50% and diffuse hypokinesis.  The aortic valve although calcified is not stenotic   CULTURES:    ANTIBIOTICS:    LINES/TUBES:  Left femoral A-line placed 10/11 Left subclavian triple-lumen placed 10/11 ETT 10/11>>>   CONSULTANTS:  Neurology  Nephrology   SUBJECTIVE:  No events overnight, myoclonus off propofol  CONSTITUTIONAL: BP (!) 129/57   Pulse (!) 59   Temp (!) 96.5 F (35.8 C) (Axillary)   Resp 11   Ht 6\' 1"  (1.854 m)   Wt 69.1 kg   SpO2 99%   BMI 20.10 kg/m   I/O last 3 completed shifts: In: 3105.2 [I.V.:1970.9; NG/GT:754; IV Piggyback:380.3] Out: 2260 [Urine:60; Emesis/NG output:200; Other:2000]  CVP:  [2 mmHg-5 mmHg] 2 mmHg  Vent Mode: PRVC FiO2 (%):  [30 %-40 %] 30 % Set Rate:  [11 bmp] 11 bmp Vt Set:  [630 mL] 630 mL PEEP:  [5 cmH20] 5 cmH20 Plateau Pressure:  [23 SWF09-32 cmH20] 28 cmH20  PHYSICAL EXAM: General: Acute on chronically ill appearing male, sedate on vent HEENT: [place]/AT, pupils are 2 mm and non-reactive, no dolls eye, corneal or gag this AM but does have  a respiratory drive Cardiovascular: RRR, Nl S1/S2 and -M/R/G Lungs: CTA bialterally after suction Abdomen: Soft, NT, ND and +BS Musculoskeletal: He is status post left BKA.  There is no edema Skin:  Intact  I reviewed MRI myself, diffuse anoxic injury with cytotoxic edema noted  RESOLVED PROBLEM LIST   ASSESSMENT AND PLAN   This is a 67 year old diabetic with end-stage renal disease on dialysis who has suffered from out of hospital PEA arrest on 10/11 Therapeutic hypothermia protocol has been completed. Neurologic status is extremely poor.  I am limiting all drugs with any potential for sedation to facilitate a neurological examination.  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   DVT PROPHYLAXIS: Subcutaneous  heparin SUP: Protonix NUTRITION:  LABS  Glucose Recent Labs  Lab 01/28/18 0734 01/28/18 1200 01/28/18 1550 01/28/18 1941 01/28/18 2334 2018-02-02 0412  GLUCAP 104* 120* 106* 143* 179* 126*    BMET Recent Labs  Lab 01/27/18 2340 01/28/18 0400 02/02/2018 0543  NA 140 138 134*  K 4.9 5.0 5.1  CL 106 104 94*  CO2 22 20* 23  BUN 53* 55* 48*  CREATININE 13.36* 13.45* 9.30*  GLUCOSE 95 101* 152*    Liver Enzymes Recent Labs  Lab 01/30/2018 0711 01/27/18 0103 01/28/18 0400  2018-02-02 0543  AST 204* 28 28  --   ALT 212* 74* 43  --   ALKPHOS 87 56 60  --   BILITOT 0.7 0.6 0.7  --   ALBUMIN 3.3* 2.3* 2.3* 2.0*    Electrolytes Recent Labs  Lab 01/26/18 0500  01/27/18 2340 01/28/18 0400 01/28/18 1653 02/02/2018 0543  CALCIUM 8.0*   < > 6.9* 6.8*  --  6.9*  MG 2.6*  --   --   --  1.8 1.8  PHOS 5.0*  --   --   --  5.6* 7.5*   < > = values in this interval not displayed.    CBC Recent Labs  Lab 01/27/18 0103 01/28/18 0400 2018/02/02 0543  WBC 5.1 4.3 3.6*  HGB 9.0* 8.6* 8.1*  HCT 28.1* 27.8* 24.9*  PLT 91* 77* 68*    ABG Recent Labs  Lab 01/27/18 0404 01/28/18 0350 2018-02-02 0557  PHART 7.296* 7.332* 7.382  PCO2ART 54.1* 38.2 39.0  PO2ART 100.0 71.0* 71.0*    Coag's Recent Labs  Lab 02/04/2018 0817 01/31/2018 1623 01/24/2018 1849  APTT 31 33 36  INR 1.09 1.18 1.25    Sepsis Markers Recent Labs  Lab 02/01/2018 0730 01/27/18 0359  LATICACIDVEN 8.01* 0.9    Cardiac Enzymes Recent Labs  Lab 02/01/2018 1436 01/16/2018 1939 01/26/18 0125  TROPONINI 0.54* 0.67* 0.80*   The patient is critically ill with multiple organ systems failure and requires high complexity decision making for assessment and support, frequent evaluation and titration of therapies, application of advanced monitoring technologies and extensive interpretation of multiple databases.   Critical Care Time devoted to patient care services described in this note is  40  Minutes. This time reflects time of care of this signee Dr Jennet Maduro. This critical care time does not reflect procedure time, or teaching time or supervisory time of PA/NP/Med student/Med Resident etc but could involve care discussion time.  Rush Farmer, M.D. [facility] Pulmonary/Critical Care Medicine. Pager: [phone]. After hours pager: [phone].

## 2018-02-15 NOTE — Procedures (Signed)
LTM-EEG Report  HISTORY: Continuous video-EEG monitoring performed for30year old with anoxia, myoclonus. ACQUISITION: International 10-20 system for electrode placement; 18 channels with additional eyes linked to ipsilateral ears and EKG. Additional T1-T2 electrodes were used. Continuous video recording obtained.   EEG NUMBER:  MEDICATIONS:  Day4:See EMR  DAY #4: from073010/14/19to 1751 01/28/18  BACKGROUND: An overalllowvoltage, continuous record withpoorspontaneous variability andno definitereactivity. The background consisted of attenuated, low voltage 1-5Hz  activity bilaterally with sparse superimposed faster frequencies, later becoming attenuated.  EPILEPTIFORM/PERIODIC ACTIVITY:none SEIZURES: none EVENTS:none  EKG: no significant arrhythmia  SUMMARY: This wasan abnormal continuous video EEG due to low voltage slowing and right maximal periodic activity, indicative of a diffuse cerebral disturbance with underlying epileptogenic potential. No seizures were seen.   Report entered 02/14/18 due to transcription error

## 2018-02-15 NOTE — Progress Notes (Signed)
Patient has been off propofol since this morning.  No signs of seizure activity.  Extensive conversation with the family, informed of the results of the MRI and informed of my communication with neurology.  Offered the options of trach/peg with SNF placement essentially under current conditions vs comfort measures.  They were all in agreement that patient would not want to live this way and would rather be comfortable.  After discussion, decided to proceed with DNR now and once family is ready will begin morphine and extubate.  When family is ready they will inform bedside RN and release orders left by me as signed and held.  The patient is critically ill with multiple organ systems failure and requires high complexity decision making for assessment and support, frequent evaluation and titration of therapies, application of advanced monitoring technologies and extensive interpretation of multiple databases.   Critical Care Time devoted to patient care services described in this note is  45  Minutes. This time reflects time of care of this signee Dr Jennet Maduro. This critical care time does not reflect procedure time, or teaching time or supervisory time of PA/NP/Med student/Med Resident etc but could involve care discussion time.  Rush Farmer, M.D. [facility] Pulmonary/Critical Care Medicine. Pager: [phone]. After hours pager: [phone].

## 2018-02-15 NOTE — Progress Notes (Signed)
IV Morphine gtt 50 mls wasted with Winfred Burn and Trevor Cook,RN in sink.

## 2018-02-15 NOTE — Death Summary Note (Signed)
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 CPR started almost immediately he was intubated in the field transported to [facility] and noted to have myoclonic jerks once he was stabilized  Patient has been off propofol since this morning.  No signs of seizure activity.  Extensive conversation with the family, informed of the results of the MRI and informed of my communication with neurology.  Offered the options of trach/peg with SNF placement essentially under current conditions vs comfort measures.  They were all in agreement that patient would not want to live this way and would rather be comfortable.  After discussion, decided to proceed with DNR now and once family is ready will begin morphine and extubate.  When family is ready they will inform bedside  RN and release orders left by me as signed and held.  Family was ready and orders were released, comfort care instituted and patient expired shortly thereafter with family bedside.    Pertinent Labs and Studies  Significant Diagnostic Studies Ct Head Wo Contrast  Result Date: 12-Feb-2018 CLINICAL DATA:  Altered level of consciousness.  Dialysis patient EXAM: CT HEAD WITHOUT CONTRAST TECHNIQUE: Contiguous axial images were obtained from the base of the skull through the vertex without intravenous contrast. COMPARISON:  MRI head 03/14/2017 FINDINGS: Brain: Mild atrophy. Patchy white matter hypodensity bilaterally consistent with chronic microvascular ischemia. Negative for acute infarct, hemorrhage, or mass. Vascular: Negative for hyperdense vessel. Skull: Negative Sinuses/Orbits: Mucosal edema and bony thickening left sphenoid sinus. Air-fluid levels in the maxillary sinus bilaterally. Small air-fluid level right sphenoid sinus. No orbital mass. Right cataract surgery. Other: None IMPRESSION: No acute abnormality. Atrophy and chronic microvascular ischemic changes in the white matter. Sinusitis with air-fluid levels. Electronically Signed   By: Franchot Gallo M.D.   On: 12-Feb-2018 08:06   Mr Brain Wo Contrast  Result Date: 02/16/18 CLINICAL DATA:  67 y/o M; PA arrest, anoxic brain injury post cardiac arrest. EXAM: MRI HEAD WITHOUT CONTRAST TECHNIQUE: Multiplanar, multiecho pulse sequences of the brain and surrounding structures were obtained without intravenous contrast. COMPARISON:  02-12-2018 CT head.  03/14/2017 MRI head. FINDINGS: Brain: Diffuse cortical and basal ganglia reduced diffusion. Increased T2 and FLAIR signal of the supratentorial cortex, basal ganglia, and the cerebellar folia. Findings are compatible with diffuse hypoxic ischemic injury of the brain. No associated mass effect or acute hemorrhage at this  time. No extra-axial collection, hydrocephalus, or herniation. There a few small  periventricular white matter infarctions, chronic lacunar infarctions within the thalami and pons, moderate microvascular ischemic changes of white matter, and mild volume loss of the brain. There are a few scattered foci of susceptibility hypointensity compatible with hemosiderin deposition of chronic microhemorrhage in a nonspecific distribution. Vascular: Normal flow voids. Skull and upper cervical spine: Normal marrow signal. Sinuses/Orbits: Paranasal sinus mucosal thickening partial opacification of mastoid air cells likely related to intubation. No abnormal signal of the orbits. Other: None. IMPRESSION: 1. 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 No associated acute hemorrhage or mass effect. 2. 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 Moderate chronic microvascular ischemic changes and mild volume loss of the brain. These results will be called to the ordering clinician or representative by the Radiologist Assistant, and communication documented in the PACS or zVision Dashboard. Electronically Signed   By: Kristine Garbe M.D.   On: 2018-02-23 02:11   Dg Chest Port 1 View  Result Date: 2018/02/23 CLINICAL DATA:  Respiratory failure.  ETT. EXAM: PORTABLE CHEST 1 VIEW COMPARISON:  January 27, 2018 FINDINGS: The ETT is in good position. The NG tube terminates below today's film. The left central line is stable. No pneumothorax. Calcified nodes identified in the mediastinum, unchanged. Mild to moderate edema, worsened. Increasing opacity in the right base. No other interval change. IMPRESSION: 1. Support apparatus as above. 2. Mild-to-moderate pulmonary edema, worsened. 3. Worsening more focal opacity in the right base is favored represent layering effusion with underlying atelectasis. An underlying infiltrate/pneumonia is not excluded on this study. Recommend clinical correlation. Electronically Signed   By: Dorise Bullion III M.D   On:  02/23/18 08:44   Dg Chest Port 1 View  Result Date: 01/27/2018 CLINICAL DATA:  Cardiac arrest EXAM: PORTABLE CHEST 1 VIEW COMPARISON:  02/09/2018 FINDINGS: Left subclavian central line, endotracheal tube and nasogastric catheter are noted and stable. Cardiac shadow is stable. Multiple calcified lymph nodes are identified. The lungs show improved aeration bilaterally although some persistent density remains in the right base. No pneumothorax is noted. IMPRESSION: Improved aeration. Tubes and lines as described. Electronically Signed   By: Inez Catalina M.D.   On: 01/27/2018 08:22   Dg Chest Port 1 View  Result Date: 02/04/2018 CLINICAL DATA:  Encounter for central line placement EXAM: PORTABLE CHEST 1 VIEW COMPARISON:  02/14/2018 FINDINGS: Left central line is been placed with the tip in the SVC. No pneumothorax. Endotracheal tube and NG tube are unchanged. Cardiomegaly. Worsening bilateral airspace disease. Small right effusion layering. No acute bony abnormality. IMPRESSION: Interval placement of left central line with the tip in the SVC. No pneumothorax. Worsening diffuse bilateral airspace opacities, likely edema. Small layering right effusion. Electronically Signed   By: Rolm Baptise M.D.   On: 01/28/2018 10:42   Dg Chest Portable 1 View  Result Date: 02/14/2018 CLINICAL DATA:  Cardiac arrest.  ESRD.  Sarcoid EXAM: PORTABLE CHEST 1 VIEW COMPARISON:  07/10/2017 FINDINGS: Endotracheal tube in good position.  NG tube in the stomach. Diffuse bilateral airspace disease bilaterally. Small bilateral pleural effusions. Calcified lymph nodes left superior mediastinum unchanged. IMPRESSION: Endotracheal tube in good position. Symmetric bilateral airspace disease with bilateral effusions. Probable pulmonary edema. Electronically Signed   By: Franchot Gallo M.D.   On: 02/14/2018 08:03    Microbiology Recent Results (from the past 240 hour(s))  MRSA PCR Screening     Status: None  Collection Time:  01/27/2018  9:36 AM  Result Value Ref Range Status   MRSA by PCR NEGATIVE NEGATIVE Final    Comment:        The GeneXpert MRSA Assay (FDA approved for NASAL specimens only), is one component of a comprehensive MRSA colonization surveillance program. It is not intended to diagnose MRSA infection nor to guide or monitor treatment for MRSA infections. Performed at [facility], [place] [address], [place], [place] [address]     Lab Basic Metabolic Panel: Recent Labs  Lab 01/26/18 0500  01/27/18 1608 01/27/18 2000 01/27/18 2340 01/28/18 0400 01/28/18 1653 2018/01/30 0543  NA 142   < > 141 140 140 138  --  134*  K 4.5   < > 4.1 4.5 4.9 5.0  --  5.1  CL 107   < > 107 103 106 104  --  94*  CO2 24   < > 22 21* 22 20*  --  23  GLUCOSE 103*   < > 92 81 95 101*  --  152*  BUN 42*   < > 48* 49* 53* 55*  --  48*  CREATININE 11.02*   < > 12.61* 12.99* 13.36* 13.45*  --  9.30*  CALCIUM 8.0*   < > 7.0* 7.0* 6.9* 6.8*  --  6.9*  MG 2.6*  --   --   --   --   --  1.8 1.8  PHOS 5.0*  --   --   --   --   --  5.6* 7.5*   < > = values in this interval not displayed.   Liver Function Tests: Recent Labs  Lab 02/10/2018 0711 01/27/18 0103 01/28/18 0400 01-30-18 0543  AST 204* 28 28  --   ALT 212* 74* 43  --   ALKPHOS 87 56 60  --   BILITOT 0.7 0.6 0.7  --   PROT 7.0 5.1* 4.9*  --   ALBUMIN 3.3* 2.3* 2.3* 2.0*   No results for input(s): LIPASE, AMYLASE in the last 168 hours. No results for input(s): AMMONIA in the last 168 hours. CBC: Recent Labs  Lab 02/04/2018 0711  01/26/18 0500  01/26/18 1224 01/26/18 1624 01/27/18 0103 01/28/18 0400 01-30-18 0543  WBC 9.4  --  5.9  --   --   --  5.1 4.3 3.6*  NEUTROABS 6.1  --   --   --   --   --  3.9 3.7  --   HGB 11.8*   < > 9.2*   < > 8.5* 9.2* 9.0* 8.6* 8.1*  HCT 37.8*   < > 28.9*   < > 25.0* 27.0* 28.1* 27.8* 24.9*  MCV 97.7  --  92.0  --   --   --  94.9 95.5 93.6  PLT 177  --  105*  --   --   --  91* 77* 68*   < > = values in this  interval not displayed.   Cardiac Enzymes: Recent Labs  Lab 01/30/2018 0817 01/30/2018 1436 02/11/2018 1939 01/26/18 0125  TROPONINI 0.20* 0.54* 0.67* 0.80*   Sepsis Labs: Recent Labs  Lab 02/04/2018 0730 01/26/18 0500 01/27/18 0103 01/27/18 0359 01/28/18 0400 2018-01-30 0543  WBC  --  5.9 5.1  --  4.3 3.6*  LATICACIDVEN 8.01*  --   --  0.9  --   --     Procedures/Operations     YACOUB,WESAM 01/30/2018, 7:52 PM

## 2018-02-15 DEATH — deceased

## 2019-08-23 IMAGING — DX DG CHEST 1V PORT
1 series · 2 of 2 positions shown · non-contrast
Comparison: January 27, 2018

CLINICAL DATA: Respiratory failure.  ETT.

EXAM:
PORTABLE CHEST 1 VIEW

[Series 1: chest · 0.14mm/px · 2 of 2 slices shown]
[im 1/2]
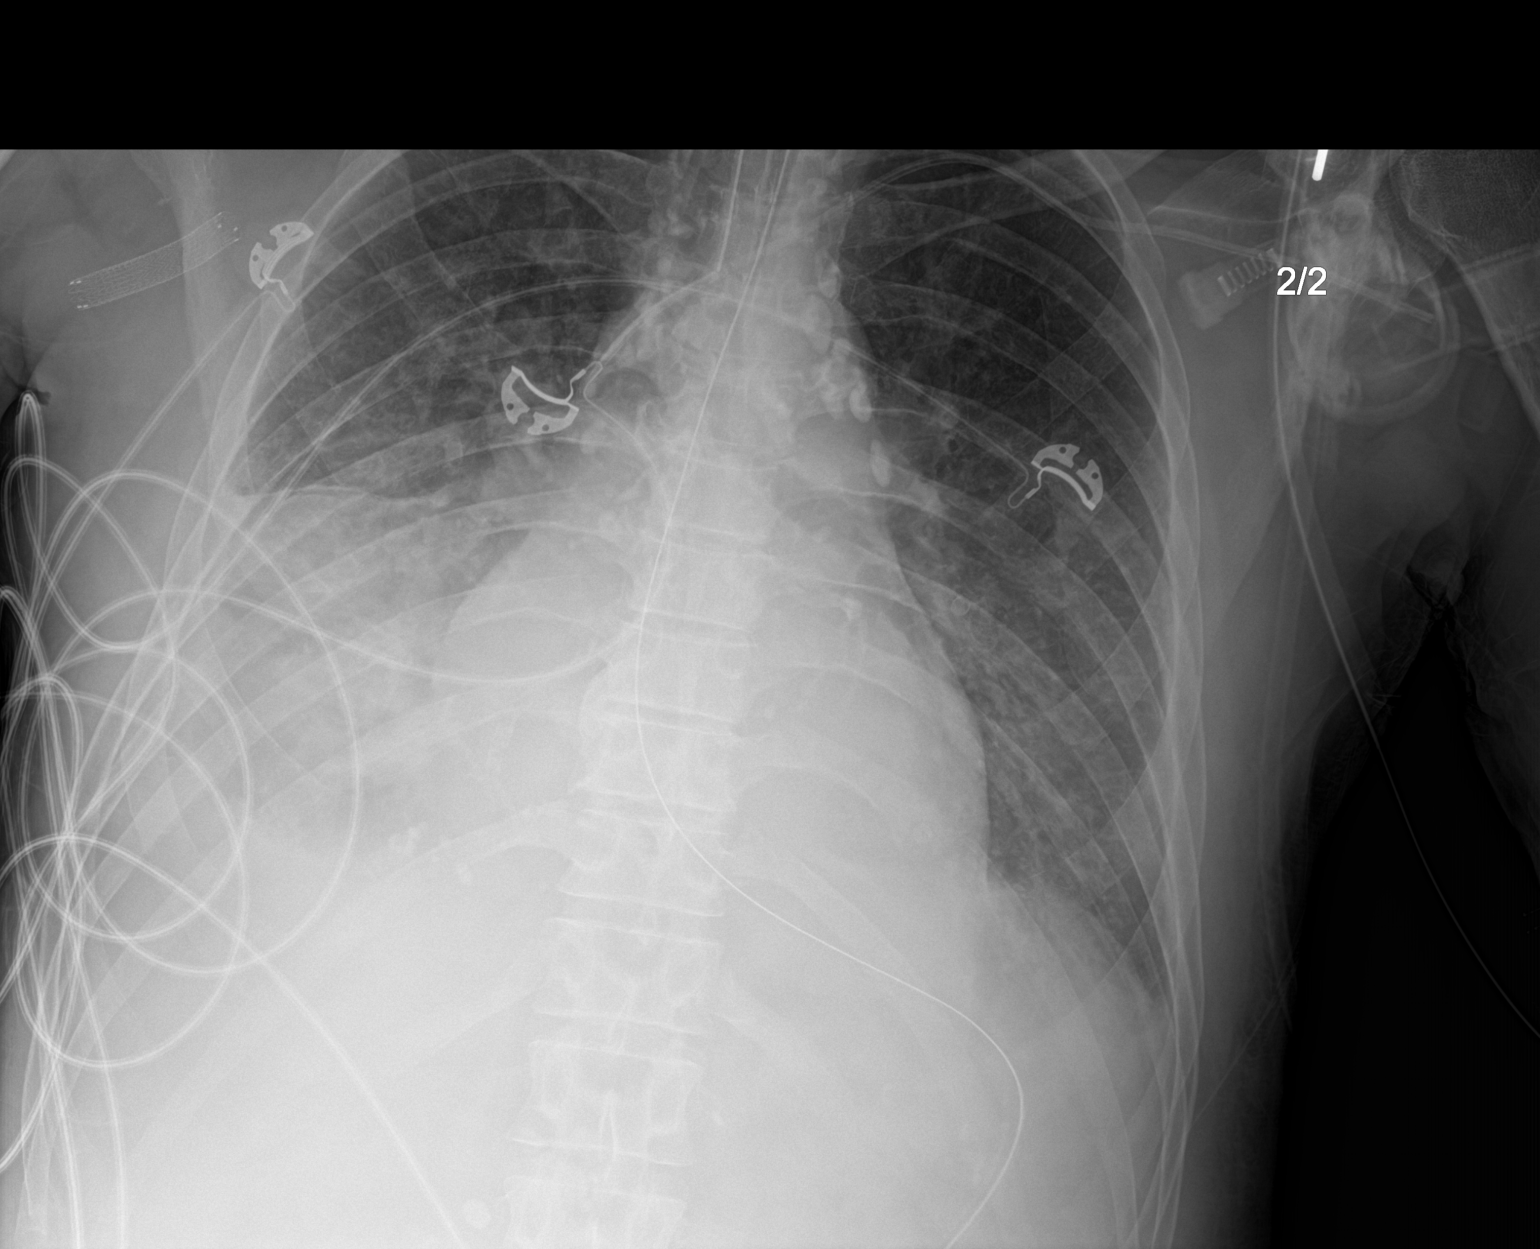
[im 2/2]
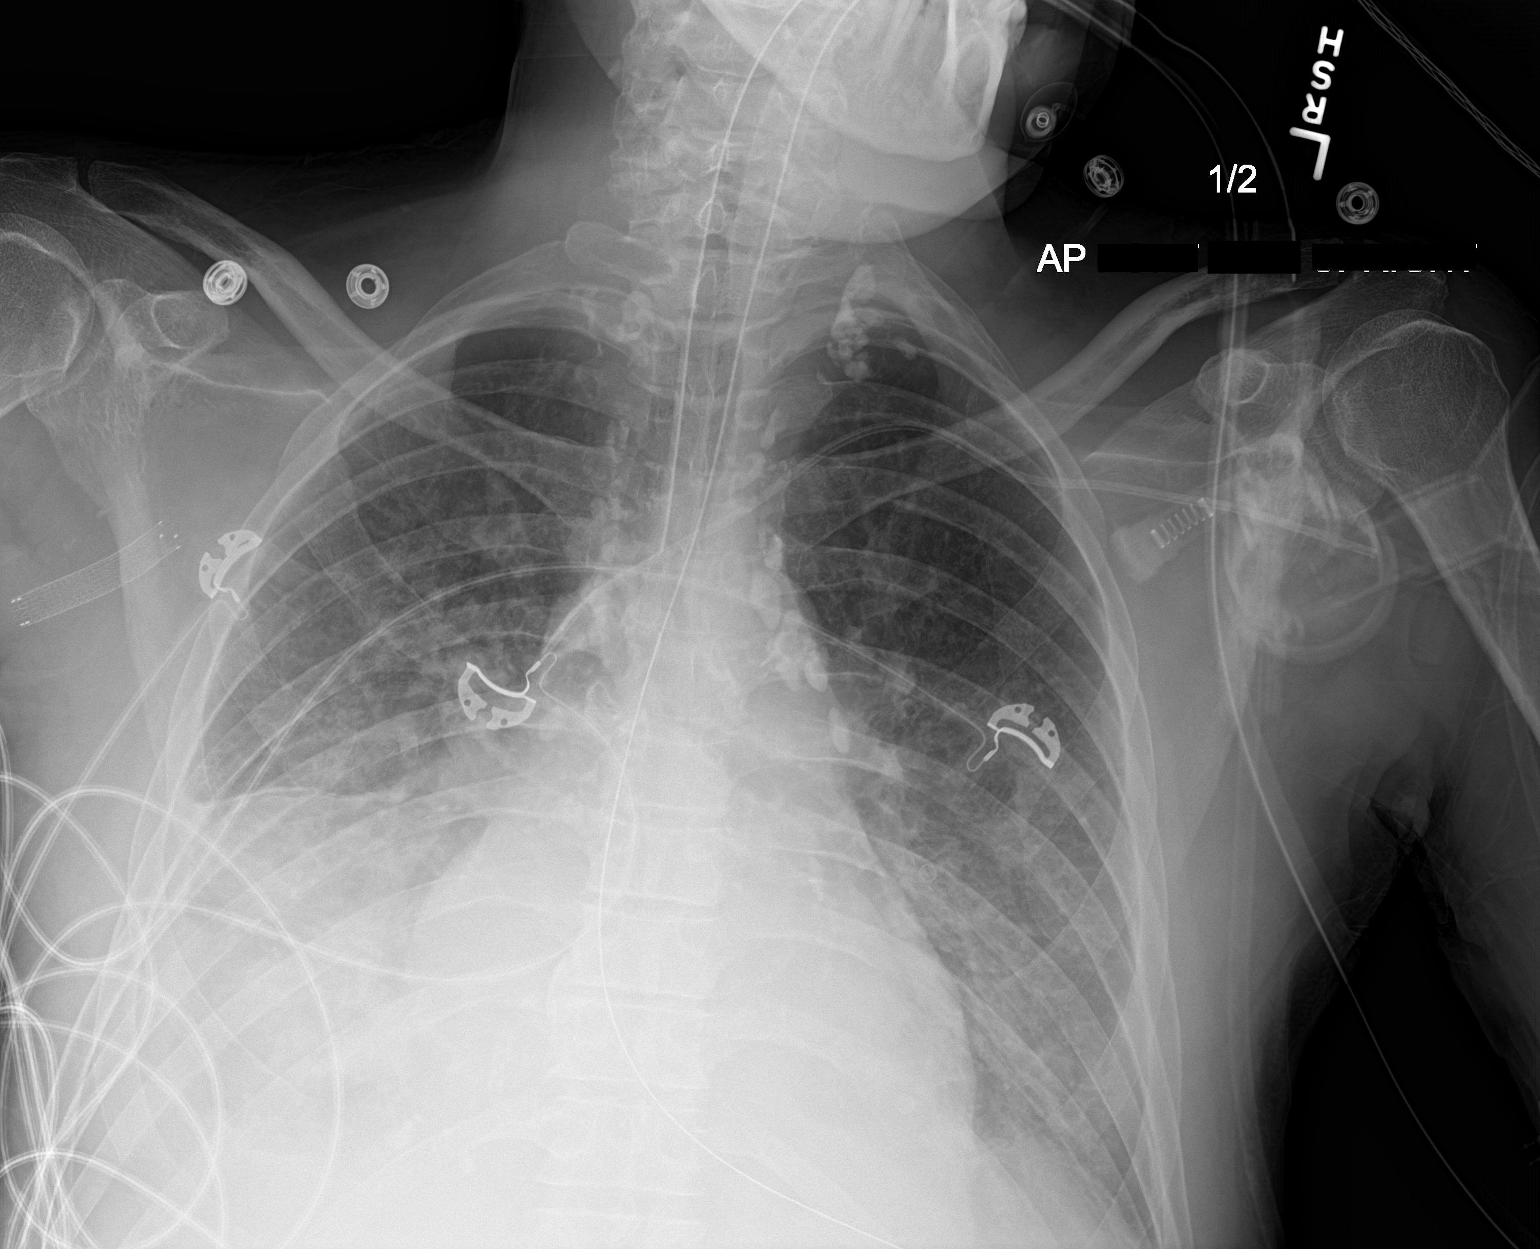

[2 of 2 positions shown; findings below may reference images not displayed]

FINDINGS: The ETT is in good position. The NG tube terminates below today's
film. The left central line is stable. No pneumothorax. Calcified
nodes identified in the mediastinum, unchanged. Mild to moderate
edema, worsened. Increasing opacity in the right base. No other
interval change.
IMPRESSION: 1. Support apparatus as above.
2. Mild-to-moderate pulmonary edema, worsened.
3. Worsening more focal opacity in the right base is favored
represent layering effusion with underlying atelectasis. An
underlying infiltrate/pneumonia is not excluded on this study.
Recommend clinical correlation.

## 2019-08-23 IMAGING — MR MR HEAD W/O CM
10 of 11 series · 42 of 48 positions shown · non-contrast
Comparison: 01/25/2018 CT head.  03/14/2017 MRI head.

CLINICAL DATA: 67 y/o M; PA arrest, anoxic brain injury post
cardiac arrest.

EXAM:
MRI HEAD WITHOUT CONTRAST
TECHNIQUE: Multiplanar, multiecho pulse sequences of the brain and surrounding
structures were obtained without intravenous contrast.

[Series 5: DWI · axial · 4.0mm · 0.88mm/px · z∈[-5,+130]mm · 8 of 70 slices shown (1 of 4)]
[im 1/70]
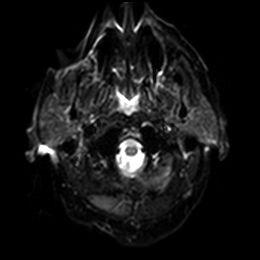
[im 10/70]
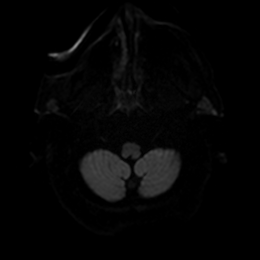
[im 20/70]
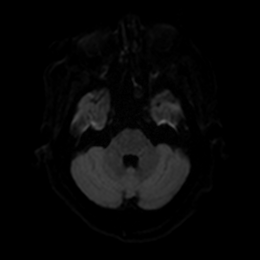
[im 30/70]
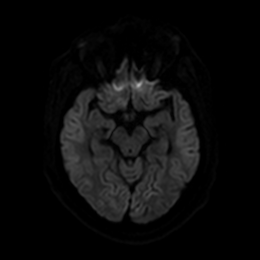
[im 40/70]
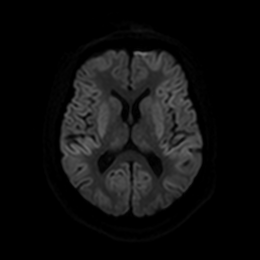
[im 50/70]
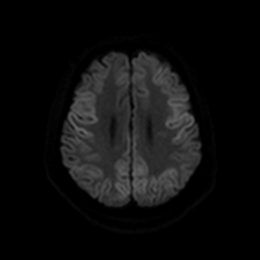
[im 60/70]
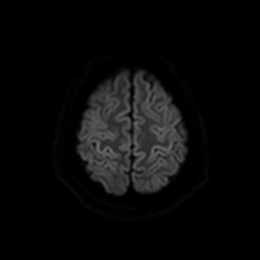
[im 70/70]
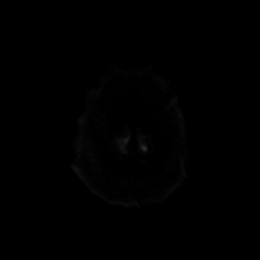

[Series 6: DWI · axial · 4.0mm · 0.88mm/px · z∈[-5,+130]mm · 4 of 35 slices shown (2 of 4)]
[im 1/35]
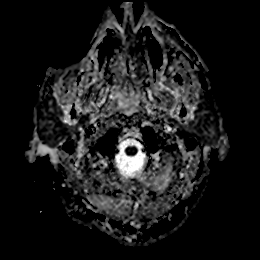
[im 12/35]
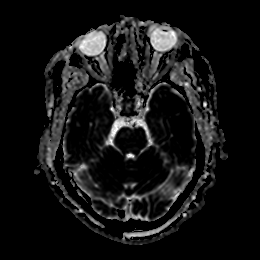
[im 23/35]
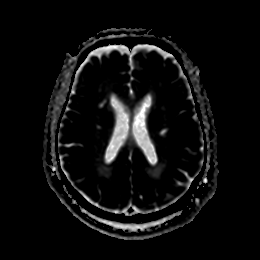
[im 35/35]
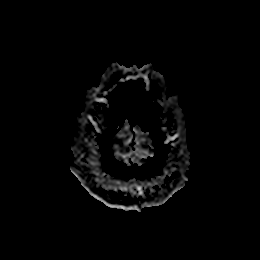

[Series 7: DWI · coronal · 4.0mm · 0.88mm/px · 7 of 68 slices shown (3 of 4)]
[im 1/68]
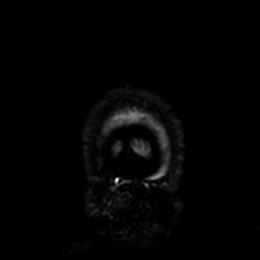
[im 12/68]
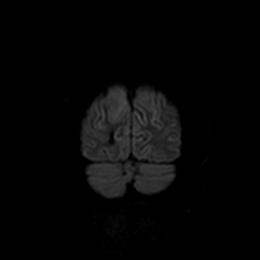
[im 23/68]
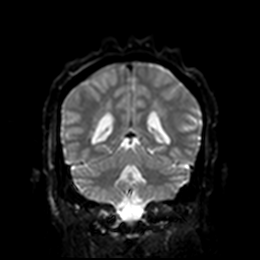
[im 34/68]
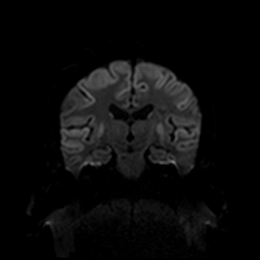
[im 45/68]
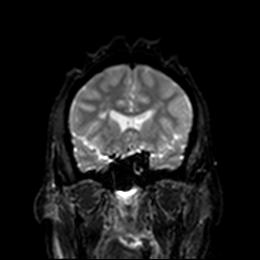
[im 56/68]
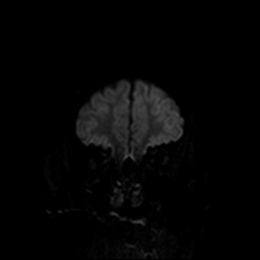
[im 68/68]
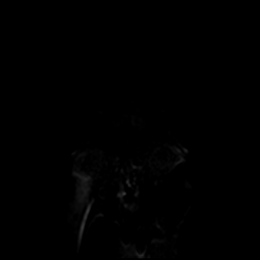

[Series 8: DWI · coronal · 4.0mm · 0.88mm/px · 3 of 34 slices shown (4 of 4)]
[im 1/34]
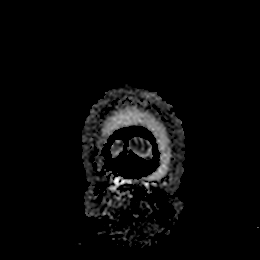
[im 17/34]
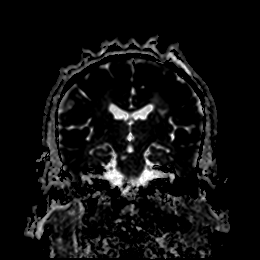
[im 34/34]
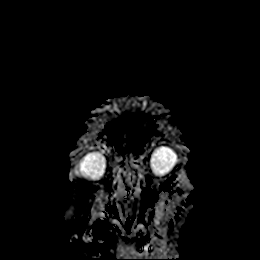

[Series 9: T1 · sagittal · 5.0mm · 0.75mm/px · 2 of 23 slices shown]
[im 1/23]
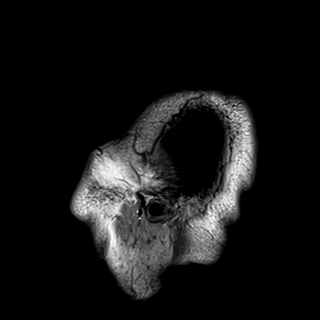
[im 23/23]
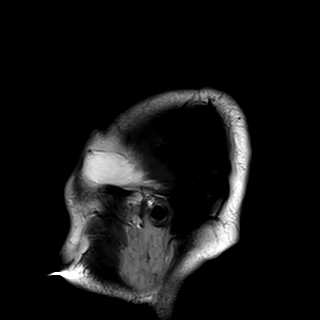

[Series 10: T2 · axial · 5.0mm · 0.72mm/px · z∈[-10,+132]mm · 2 of 25 slices shown (1 of 2)]
[im 1/25]
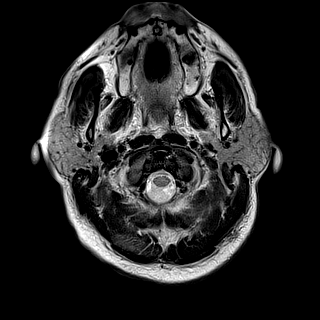
[im 25/25]
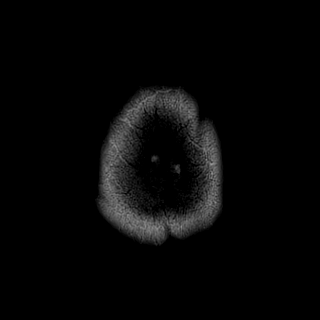

[Series 11: FLAIR · axial · 5.0mm · 0.45mm/px · z∈[-11,+131]mm · 2 of 25 slices shown]
[im 1/25]
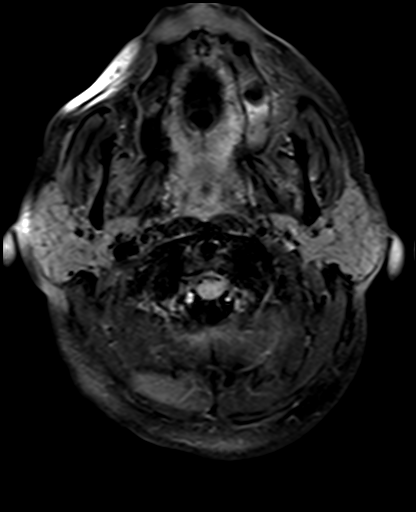
[im 25/25]
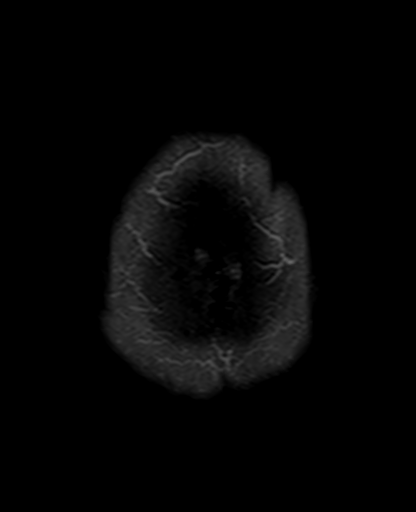

[Series 12: swi_images · axial · 3.0mm · 0.90mm/px · z∈[-21,+154]mm · 6 of 60 slices shown]
[im 1/60]
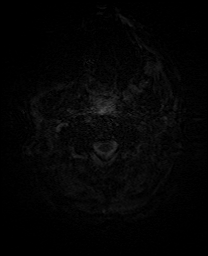
[im 12/60]
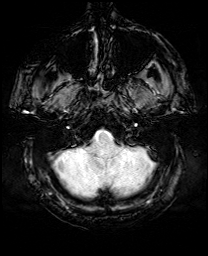
[im 24/60]
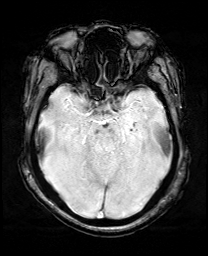
[im 36/60]
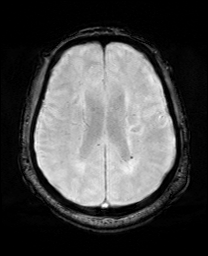
[im 48/60]
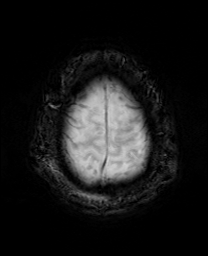
[im 60/60]
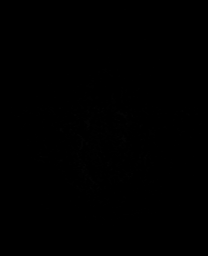

[Series 13: mip_images(sw) · axial · 24.0mm · 0.90mm/px · z∈[-10,+144]mm · 5 of 53 slices shown]
[im 1/53]
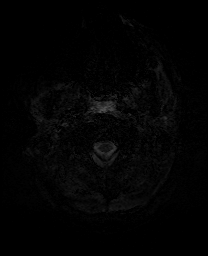
[im 14/53]
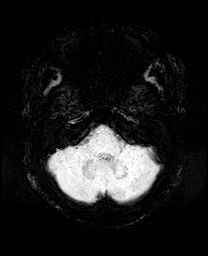
[im 27/53]
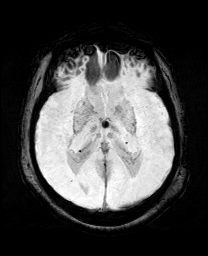
[im 40/53]
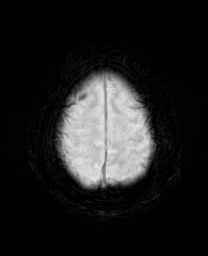
[im 53/53]
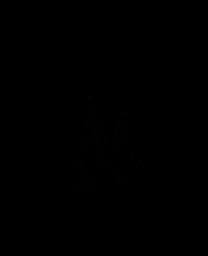

[Series 15: T2 · coronal · 5.0mm · 0.34mm/px · 3 of 29 slices shown (2 of 2)]
[im 1/29]
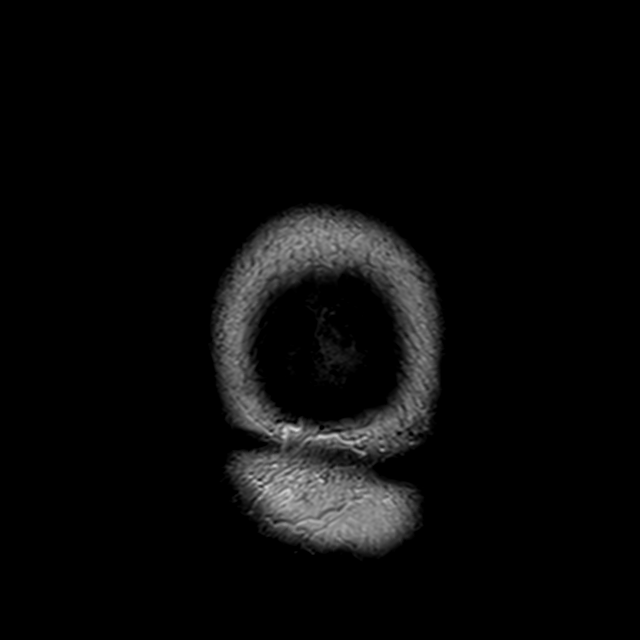
[im 15/29]
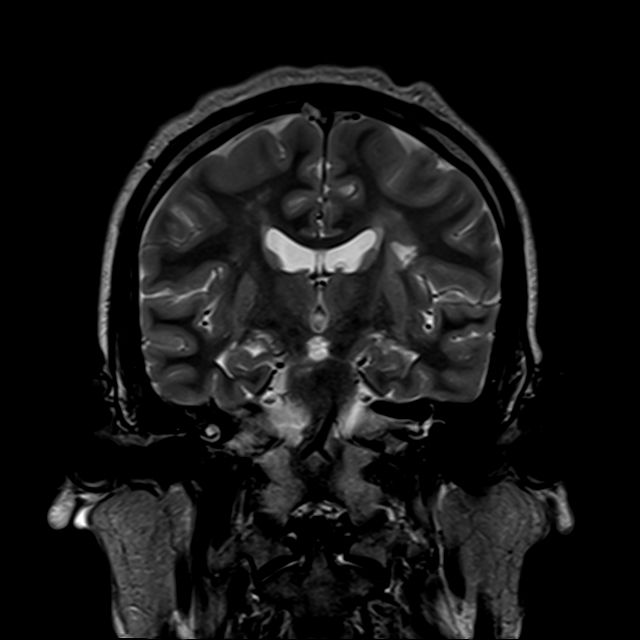
[im 29/29]
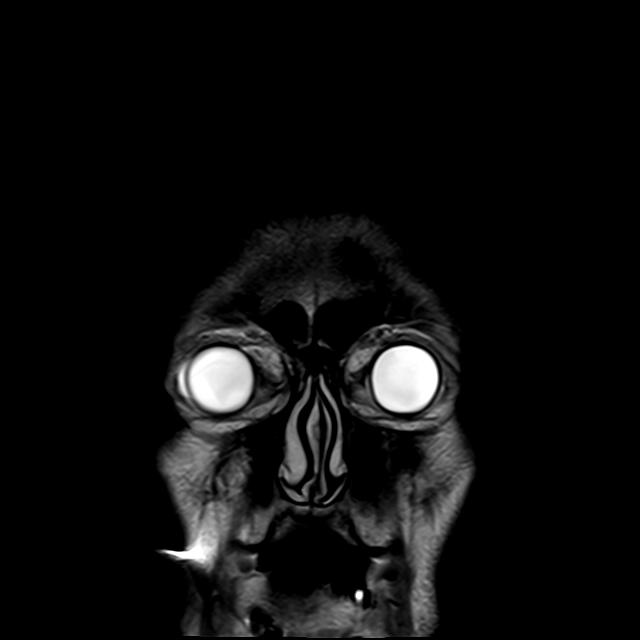

[42 of 48 positions shown; findings below may reference images not displayed]

FINDINGS: Brain: Diffuse cortical and basal ganglia reduced diffusion.
Increased T2 and FLAIR signal of the supratentorial cortex, basal
ganglia, and the cerebellar folia. Findings are compatible with
diffuse hypoxic ischemic injury of the brain. No associated mass
effect or acute hemorrhage at this time. No extra-axial collection,
hydrocephalus, or herniation. There a few small periventricular
white matter infarctions, chronic lacunar infarctions within the
thalami and pons, moderate microvascular ischemic changes of white
matter, and mild volume loss of the brain. There are a few scattered
foci of susceptibility hypointensity compatible with hemosiderin
deposition of chronic microhemorrhage in a nonspecific distribution.

Vascular: Normal flow voids.

Skull and upper cervical spine: Normal marrow signal.

Sinuses/Orbits: Paranasal sinus mucosal thickening partial
opacification of mastoid air cells likely related to intubation. No
abnormal signal of the orbits.

Other: None.
IMPRESSION: 1. Diffuse hypoxic ischemic injury of the brain. No associated acute
hemorrhage or mass effect.
2. Multiple small chronic infarcts in the basal ganglia, pons, and
periventricular white matter. Moderate chronic microvascular
ischemic changes and mild volume loss of the brain.

These results will be called to the ordering clinician or
representative by the Radiologist Assistant, and communication
documented in the PACS or zVision Dashboard.

By: Jitendra Chatelain M.D.
